# Patient Record
Sex: Female | Born: 1980 | Race: Black or African American | Hispanic: No | Smoking: Never smoker
Health system: Southern US, Community
[De-identification: ages and names within clinical notes are randomized; demographics above are authoritative.]

## PROBLEM LIST (undated history)

## (undated) DIAGNOSIS — D649 Anemia, unspecified: Secondary | ICD-10-CM

## (undated) DIAGNOSIS — D689 Coagulation defect, unspecified: Secondary | ICD-10-CM

## (undated) DIAGNOSIS — F32A Depression, unspecified: Secondary | ICD-10-CM

## (undated) DIAGNOSIS — R87629 Unspecified abnormal cytological findings in specimens from vagina: Secondary | ICD-10-CM

## (undated) DIAGNOSIS — D693 Immune thrombocytopenic purpura: Secondary | ICD-10-CM

## (undated) DIAGNOSIS — D219 Benign neoplasm of connective and other soft tissue, unspecified: Secondary | ICD-10-CM

## (undated) DIAGNOSIS — O09529 Supervision of elderly multigravida, unspecified trimester: Secondary | ICD-10-CM

## (undated) DIAGNOSIS — F329 Major depressive disorder, single episode, unspecified: Secondary | ICD-10-CM

## (undated) DIAGNOSIS — I10 Essential (primary) hypertension: Secondary | ICD-10-CM

## (undated) DIAGNOSIS — I309 Acute pericarditis, unspecified: Secondary | ICD-10-CM

## (undated) DIAGNOSIS — R7309 Other abnormal glucose: Secondary | ICD-10-CM

## (undated) DIAGNOSIS — F419 Anxiety disorder, unspecified: Secondary | ICD-10-CM

## (undated) DIAGNOSIS — E669 Obesity, unspecified: Secondary | ICD-10-CM

## (undated) HISTORY — DX: Coagulation defect, unspecified: D68.9

## (undated) HISTORY — DX: Anxiety disorder, unspecified: F41.9

## (undated) HISTORY — DX: Supervision of elderly multigravida, unspecified trimester: O09.529

## (undated) HISTORY — DX: Essential (primary) hypertension: I10

## (undated) HISTORY — DX: Major depressive disorder, single episode, unspecified: F32.9

## (undated) HISTORY — DX: Acute pericarditis, unspecified: I30.9

## (undated) HISTORY — DX: Benign neoplasm of connective and other soft tissue, unspecified: D21.9

## (undated) HISTORY — PX: UMBILICAL HERNIA REPAIR: SHX196

## (undated) HISTORY — DX: Unspecified abnormal cytological findings in specimens from vagina: R87.629

## (undated) HISTORY — DX: Obesity, unspecified: E66.9

## (undated) HISTORY — DX: Anemia, unspecified: D64.9

## (undated) HISTORY — DX: Depression, unspecified: F32.A

## (undated) HISTORY — DX: Other abnormal glucose: R73.09

---

## 2011-01-17 ENCOUNTER — Emergency Department (HOSPITAL_COMMUNITY)
Admission: EM | Admit: 2011-01-17 | Discharge: 2011-01-17 | Payer: Self-pay | Source: Home / Self Care | Admitting: Family Medicine

## 2011-01-22 LAB — CBC
HCT: 32.4 % — ABNORMAL LOW (ref 36.0–46.0)
MCV: 74 fL — ABNORMAL LOW (ref 78.0–100.0)
Platelets: 95 10*3/uL — ABNORMAL LOW (ref 150–400)
RBC: 4.38 MIL/uL (ref 3.87–5.11)
WBC: 7.3 10*3/uL (ref 4.0–10.5)

## 2011-01-22 LAB — POCT PREGNANCY, URINE: Preg Test, Ur: NEGATIVE

## 2011-01-22 LAB — URINE CULTURE
Colony Count: 8000
Culture  Setup Time: 201201181208

## 2011-01-22 LAB — POCT URINALYSIS DIPSTICK
Ketones, ur: NEGATIVE mg/dL
Specific Gravity, Urine: 1.015 (ref 1.005–1.030)
Urine Glucose, Fasting: NEGATIVE mg/dL

## 2011-01-22 LAB — DIFFERENTIAL
Basophils Absolute: 0 10*3/uL (ref 0.0–0.1)
Eosinophils Absolute: 0.1 10*3/uL (ref 0.0–0.7)
Lymphocytes Relative: 28 % (ref 12–46)
Lymphs Abs: 2.1 10*3/uL (ref 0.7–4.0)
Neutrophils Relative %: 62 % (ref 43–77)

## 2012-02-20 ENCOUNTER — Telehealth: Payer: Self-pay | Admitting: *Deleted

## 2012-02-20 ENCOUNTER — Encounter (HOSPITAL_COMMUNITY): Payer: Self-pay | Admitting: Emergency Medicine

## 2012-02-20 ENCOUNTER — Encounter (HOSPITAL_COMMUNITY): Payer: Self-pay | Admitting: *Deleted

## 2012-02-20 ENCOUNTER — Emergency Department (HOSPITAL_COMMUNITY)
Admission: EM | Admit: 2012-02-20 | Discharge: 2012-02-20 | Discharge status: Against Medical Advice | Payer: Managed Care, Other (non HMO) | Attending: Emergency Medicine | Admitting: Emergency Medicine

## 2012-02-20 ENCOUNTER — Emergency Department (HOSPITAL_COMMUNITY)
Admission: EM | Admit: 2012-02-20 | Discharge: 2012-02-20 | Disposition: A | Discharge status: Nursing Home (Includes ICF) | Payer: Self-pay | Source: Home / Self Care | Attending: Emergency Medicine | Admitting: Emergency Medicine

## 2012-02-20 ENCOUNTER — Other Ambulatory Visit: Payer: Self-pay | Admitting: Oncology

## 2012-02-20 ENCOUNTER — Ambulatory Visit (HOSPITAL_COMMUNITY)
Admission: RE | Admit: 2012-02-20 | Discharge: 2012-02-20 | Disposition: A | Discharge status: Home / Self Care | Payer: Managed Care, Other (non HMO) | Source: Ambulatory Visit | Attending: Oncology | Admitting: Oncology

## 2012-02-20 DIAGNOSIS — D509 Iron deficiency anemia, unspecified: Secondary | ICD-10-CM | POA: Insufficient documentation

## 2012-02-20 DIAGNOSIS — D696 Thrombocytopenia, unspecified: Secondary | ICD-10-CM

## 2012-02-20 DIAGNOSIS — I998 Other disorder of circulatory system: Secondary | ICD-10-CM | POA: Insufficient documentation

## 2012-02-20 DIAGNOSIS — R51 Headache: Secondary | ICD-10-CM | POA: Insufficient documentation

## 2012-02-20 DIAGNOSIS — D693 Immune thrombocytopenic purpura: Secondary | ICD-10-CM | POA: Insufficient documentation

## 2012-02-20 HISTORY — DX: Immune thrombocytopenic purpura: D69.3

## 2012-02-20 LAB — CBC
MCH: 21.8 pg — ABNORMAL LOW (ref 26.0–34.0)
MCHC: 31.7 g/dL (ref 30.0–36.0)
MCV: 69 fL — ABNORMAL LOW (ref 78.0–100.0)
Platelets: 14 10*3/uL — CL (ref 150–400)
RDW: 17.5 % — ABNORMAL HIGH (ref 11.5–15.5)
WBC: 5.5 10*3/uL (ref 4.0–10.5)

## 2012-02-20 LAB — POCT I-STAT, CHEM 8
Calcium, Ion: 1.22 mmol/L (ref 1.12–1.32)
Creatinine, Ser: 0.8 mg/dL (ref 0.50–1.10)
Glucose, Bld: 82 mg/dL (ref 70–99)
HCT: 35 % — ABNORMAL LOW (ref 36.0–46.0)
Hemoglobin: 11.9 g/dL — ABNORMAL LOW (ref 12.0–15.0)
Potassium: 3.8 mEq/L (ref 3.5–5.1)
TCO2: 27 mmol/L (ref 0–100)

## 2012-02-20 MED ORDER — PREDNISONE 50 MG PO TABS: 100.0000 mg | ORAL_TABLET | Freq: Every day | ORAL | Status: DC

## 2012-02-20 NOTE — ED Notes (Signed)
Pt here from [facility] c/o increased episodes of ecchymosis to hands and extremities; pt with hx of ITP; pt sent here for further eval

## 2012-02-20 NOTE — ED Provider Notes (Signed)
History     CSN: [id number]  Arrival date & time 02/20/12  1106   First MD Initiated Contact with Patient 02/20/12 1217      Chief Complaint  Patient presents with  . Bleeding/Bruising    (Consider location/radiation/quality/duration/timing/severity/associated sxs/prior treatment) HPI Comments:  Patient presented today to urgent care complaining of an ongoing headache for about a week somewhat intermittent in nature and localized mainly to the right side of her head. Have been also noticing some bruising inside of her mouth,  almost felt like a blister and dark looking". Patient haven't noticed also some spontaneous bruising on both of her hands and her legs within the last week including today (patient shows me a ecchymotic area interdigitally in her left hand).  Patient goes on to describe That She Was Diagnosed with ITP in [place] about a year ago. At one point she was recommended to receive a blood transfusion (perhaps platelet transfusioN)  She decided to come in today because she was feeling that this bruises are common on in different areas of her body and was having unusual type of headache. Felt that her platelets again to be low she describes lowest values she remember having was around 65.   Patient is a 31 y.o. female presenting with headaches and rash. The history is provided by the patient.  Headache The primary symptoms include headaches. Primary symptoms do not include loss of consciousness, dizziness, visual change, loss of sensation, speech change, fever or nausea. The symptoms began more than 1 week ago. The symptoms are unchanged.  The headache is not associated with visual change or weakness.  Additional symptoms include pain. Additional symptoms do not include weakness. Medical issues do not include seizures.  Rash  This is a new problem. The problem has not changed since onset.The problem is associated with nothing. There has been no fever. The rash is present on  the right arm and left arm. Pertinent negatives include no itching and no weeping.    Past Medical History  Diagnosis Date  . ITP (idiopathic thrombocytopenic purpura)     History reviewed. No pertinent past surgical history.  Family History  Problem Relation Age of Onset  . Hypertension Mother   . Diabetes Father     History  Substance Use Topics  . Smoking status: Not on file  . Smokeless tobacco: Not on file  . Alcohol Use: Yes     occasionally    OB History    Grav Para Term Preterm Abortions TAB SAB Ect Mult Living                  Review of Systems  Constitutional: Negative for fever, activity change and appetite change.  Gastrointestinal: Negative for nausea.  Genitourinary: Negative for dysuria, hematuria and flank pain.  Skin: Positive for rash. Negative for itching.  Neurological: Positive for headaches. Negative for dizziness, speech change, loss of consciousness, syncope, weakness and numbness.  Hematological: Negative for adenopathy. Bruises/bleeds easily.    Allergies  Review of patient's allergies indicates no known allergies.  Home Medications  No current outpatient prescriptions on file.  BP 116/68  Pulse 88  Temp(Src) 97.8 F (36.6 C) (Oral)  Resp 16  SpO2 100%  LMP 02/04/2012  Physical Exam  Nursing note and vitals reviewed. Constitutional: She appears well-developed and well-nourished. No distress.  HENT:  Mouth/Throat:    Eyes: Conjunctivae are normal. No scleral icterus.  Neck: Neck supple. No JVD present.  Abdominal: There is no splenomegaly.  There is no tenderness. There is no rigidity and no guarding.  Lymphadenopathy:    She has no cervical adenopathy.  Skin: Ecchymosis, petechiae and rash noted.       ED Course  Procedures (including critical care time)  Labs Reviewed  CBC - Abnormal; Notable for the following:    Hemoglobin 10.2 (*)    HCT 32.2 (*)    MCV 69.0 (*)    MCH 21.8 (*)    RDW 17.5 (*)    Platelets  14 (*)    All other components within normal limits  POCT I-STAT, CHEM 8 - Abnormal; Notable for the following:    Hemoglobin 11.9 (*)    HCT 35.0 (*)    All other components within normal limits  APTT   Ct Head Wo Contrast  02/20/2012  *RADIOLOGY REPORT*  Clinical Data:  Intermittent headaches for the past week.  CT HEAD WITHOUT CONTRAST  Technique: Contiguous axial images were obtained from the base of the skull through the vertex without contrast.  Comparison: None.  Findings: Normal appearing cerebral hemispheres and posterior fossa structures.  Normal size and position of the ventricles.  No intracranial hemorrhage, mass lesion or evidence of acute infarction.  Unremarkable bones and included portions of the paranasal sinuses.  IMPRESSION: Normal examination.  Original Report Authenticated By: Darrol Angel, M.D.     1. Thrombocytopenia       MDM   Patient presented to urgent care symptomatic with intermittent skin changes including perforated ecchymosis and petechial compliant rashes 2 oral mucosa. Also with a consistent headache. Patient with severe thrombocytopenia and also anemia       Jimmie Molly, MD 02/20/12 1757

## 2012-02-20 NOTE — ED Notes (Signed)
Pt  Reports  She  Noticed  Some  Bruising  And  Discoloration on  Skin     For  A  Few days  As  Well  As  A  Mild headache  And  r  Side  Pain  Pt  Ambulates  Upright  With a  Steady  Fluid  Gait     Appears  In no  Severe  Distress

## 2012-02-20 NOTE — ED Notes (Signed)
Pt signed out AMA.

## 2012-02-20 NOTE — Telephone Encounter (Signed)
Received a call from this patient on Monday 02/18/2012 attempting to establish with local Hematologist for history of ITP. Patient states she moved here from [place] and has not established with any physicians in the area yet. I asked patient to get in contact with her doctor she had in [place] and have them fax records and we could make her appt.  Spoke with patient again on morning of 02/20/12, she states she talked to her doctor in [place] and they said they had not received any release form and they could mail her records to her in the next two weeks. Concerned of this patient with bleeding disorder, she states she has noticed increased bruising as well as "blood spots" in her mouth. Informed patient she needs to see someone today and have her platelets checked. Patient called back at 4pm today and said her platelets were 14 at [facility], she was being sent to [place] [facility], but when she got there she could not afford the co-pay, so patient decided to leave and told the ER she would contact our office since she was not actively bleeding. Unfortunately, I was unable to catch the patient before she left the ER today, I had already spoken to our MD on call today, Dr Pierce Crane, he said he could go see her in ER once she was settled in, to have the ER doctor call him.  Patient called me back, I instructed her to come to our office to have release form signed so I can get records from [facility] in [place]. Dr Donnie Coffin did informal consult in conference room, gave orders for head ct for tonight, Rx for Prednisone to pharmacy and see for formal consult/physical assessment tomorrow at 12noon.  Patient also instructed that if she does start to have ANY bleeding, she is to report to ER.  Patient aware of appts and verbalized understanding.

## 2012-02-20 NOTE — ED Notes (Signed)
Pt  States  She  Has ITP   SHE  REPORTS  SOME  BRUISING     SHE  STATES  SHE HAS NOT SEEN A  DR  IN  OVER  1  YEAR       SHE  REPORTS  HER  LAST  DR  WAS  IN [place]

## 2012-02-21 ENCOUNTER — Other Ambulatory Visit: Payer: Managed Care, Other (non HMO) | Admitting: Lab

## 2012-02-21 ENCOUNTER — Ambulatory Visit: Payer: Managed Care, Other (non HMO)

## 2012-02-21 ENCOUNTER — Ambulatory Visit (HOSPITAL_BASED_OUTPATIENT_CLINIC_OR_DEPARTMENT_OTHER): Payer: Managed Care, Other (non HMO) | Admitting: Oncology

## 2012-02-21 ENCOUNTER — Telehealth: Payer: Self-pay | Admitting: *Deleted

## 2012-02-21 ENCOUNTER — Encounter (HOSPITAL_COMMUNITY): Payer: Self-pay | Admitting: *Deleted

## 2012-02-21 VITALS — BP 119/81 | HR 66 | Temp 98.2°F | Ht 69.0 in | Wt 236.4 lb

## 2012-02-21 DIAGNOSIS — D693 Immune thrombocytopenic purpura: Secondary | ICD-10-CM

## 2012-02-21 LAB — IRON AND TIBC
%SAT: 3 % — ABNORMAL LOW (ref 20–55)
Iron: 17 ug/dL — ABNORMAL LOW (ref 42–145)
UIBC: 510 ug/dL — ABNORMAL HIGH (ref 125–400)

## 2012-02-21 LAB — CBC & DIFF AND RETIC
Basophils Absolute: 0 10*3/uL (ref 0.0–0.1)
EOS%: 3.2 % (ref 0.0–7.0)
Eosinophils Absolute: 0.2 10*3/uL (ref 0.0–0.5)
LYMPH%: 10.8 % — ABNORMAL LOW (ref 14.0–49.7)
MCH: 21.9 pg — ABNORMAL LOW (ref 25.1–34.0)
MCV: 68.3 fL — ABNORMAL LOW (ref 79.5–101.0)
MONO%: 5 % (ref 0.0–14.0)
Platelets: 18 10*3/uL — ABNORMAL LOW (ref 145–400)
RBC: 4.61 10*6/uL (ref 3.70–5.45)
RDW: 17.5 % — ABNORMAL HIGH (ref 11.2–14.5)
Retic %: 1.53 % (ref 0.70–2.10)
Retic Ct Abs: 70.53 10*3/uL (ref 33.70–90.70)

## 2012-02-21 LAB — COMPREHENSIVE METABOLIC PANEL
Alkaline Phosphatase: 56 U/L (ref 39–117)
BUN: 10 mg/dL (ref 6–23)
CO2: 25 mEq/L (ref 19–32)
Creatinine, Ser: 0.66 mg/dL (ref 0.50–1.10)
Glucose, Bld: 89 mg/dL (ref 70–99)
Total Bilirubin: 0.8 mg/dL (ref 0.3–1.2)

## 2012-02-21 LAB — FERRITIN: Ferritin: 5 ng/mL — ABNORMAL LOW (ref 10–291)

## 2012-02-21 LAB — LACTATE DEHYDROGENASE: LDH: 233 U/L (ref 94–250)

## 2012-02-21 MED ORDER — PREDNISONE 20 MG PO TABS: 80.0000 mg | ORAL_TABLET | Freq: Every day | ORAL | Status: DC

## 2012-02-21 NOTE — Telephone Encounter (Signed)
gave patient appointment for 02-29-2012 starting at 8:45am printed out calendar and gave to the patient

## 2012-02-21 NOTE — Progress Notes (Signed)
Referral MD Dr Liliana Cline Reason for Referral: ITP  Chief Complaint  Patient presents with  . New Evaluation  : This is a 31 year old woman from [place] but was recently moved from [place] who presents with a document history of ITP principal diagnosed in 2007. As that she has had a located result back to [place] where she is currently working. She has noted some heavy menstrual bleeding recently. She got to the ER and prior to that had some lab work done which showed a platelet count of 14. We saw briefly yesterday evaluated her in our office. We gave her a prescription for prednisone. She was complaining of some headaches and so we obtain a noncontrast CT of the head which was negative for bleeding. She is currently 100 mg of prednisone a day.   HPI:  Past Medical History  Diagnosis Date  . ITP (idiopathic thrombocytopenic purpura)   : This patient was diagnosed by a hematologist in [place] in 2007. 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 She is also noted to have absent iron stores. She is been treated with oral steroids and responded. She claims her blood counts have been up and down but she'll received one course of high-dose prednisone in the past of 6 years he has been noted to be iron deficient and has received IV iron multiple times over that period of time. As noted she has recently moved back to [place] where she has been here for about 2 years but has not sought medical medical attention. She continues to have heavy menses. She is not taking any medications and does not taking oral iron pills. He has not seen a gynecologist. She has some feelings of fatigue occasional headaches occasional dyspnea on exertion.   No past surgical history on file.:  Current outpatient prescriptions:predniSONE (DELTASONE) 50 MG tablet, Take  100 mg by mouth daily., Disp: , Rfl:  Current facility-administered medications:DISCONTD: predniSONE (DELTASONE) tablet 80 mg, 80 mg, Oral, Q breakfast, Pierce Crane, MD Facility-Administered Medications Ordered in Other Visits: DISCONTD: predniSONE (DELTASONE) tablet 100 mg, 100 mg, Oral, Q breakfast, Pierce Crane, MD:     . DISCONTD: predniSONE  80 mg Oral Q breakfast  :  No Known Allergies:  Family History  Problem Relation Age of Onset  . Hypertension Mother   . Diabetes Father   :  History   Social History  . Marital Status: Single , has a SO, with 2 children    Spouse Name: N/A    Number of Children: 2  . Years of Education: [employer]   Occupational History  . Not on file.works for [employer]   Social History Main Topics  . Smoking status: No   . Smokeless tobacco: Not on file  . Alcohol Use: Yes     occasionally  . Drug Use:   . Sexually Active:    Other Topics Concern  . Not on file   Social History Narrative  . No narrative on file, previously screened for HIV and hepatitis both negative   : Family Hx History the family bleeding disorders. She is one brother in good health, father has history of diabetes.  A comprehensive review of systems was negative.  Exam: @IPVITALS @ General appearance: alert, cooperative and appears stated age Head: Normocephalic, without obvious abnormality, atraumatic Eyes: conjunctivae/corneas clear. PERRL, EOM's intact. Fundi benign. Throat: lips, mucosa, and tongue  normal; teeth and gums normal Resp: clear to auscultation bilaterally and normal percussion bilaterally Cardio: regular rate and rhythm, S1, S2 normal, no murmur, click, rub or gallop and normal apical impulse GI: soft, non-tender; bowel sounds normal; no masses,  no organomegaly Extremities: extremities normal, atraumatic, no cyanosis or edema Pulses: 2+ and symmetric Lymph nodes: Cervical, supraclavicular, and axillary nodes normal. Neurologic: Grossly  normal   Basename 02/20/12 1336 02/20/12 1322  WBC -- 5.5  HGB 11.9* 10.2*  HCT 35.0* 32.2*  PLT -- 14*    Basename 02/20/12 1336  NA 139  K 3.8  CL 103  CO2 --  GLUCOSE 82  BUN 10  CREATININE 0.80  CALCIUM --    Blood smear review:Few Large & rare giant platelets, mod ovalocytes, few helmets and targets   Pathology:n/a  Ct Head Wo Contrast  02/20/2012  *RADIOLOGY REPORT*  Clinical Data:  Intermittent headaches for the past week.  CT HEAD WITHOUT CONTRAST  Technique: Contiguous axial images were obtained from the base of the skull through the vertex without contrast.  Comparison: None.  Findings: Normal appearing cerebral hemispheres and posterior fossa structures.  Normal size and position of the ventricles.  No intracranial hemorrhage, mass lesion or evidence of acute infarction.  Unremarkable bones and included portions of the paranasal sinuses.  IMPRESSION: Normal examination.  Original Report Authenticated By: Darrol Angel, M.D.    Assessment and Plan: This is a pleasant 31 year old woman with a document history of ITP. We spent 30 minutes discussing this disease with which you know something about. She has not had problems with refractory disease in his libido prednisone as far she knows once. She is known to be iron deficient and we have scheduled her to receive IV iron. She is fatigued and has symptoms of iron deficiency including microcytic picture evidence of ovalocytes, target cells and some few helmets although her smear. In addition we talked about putting on a high-dose prednisone course for 2 weeks and then taper over 6 weeks. We'll plan to check CBCs weekly and rhythm her IV iron with her next visit next week. I discussed the option of possible splenectomy she cannot maintain an adequate platelet count off prednisone. She would like to avoid this if possible. Other options for treatment include rituxan therapy.  I will plan to see her next week  Donnie Coffin M.D., FRCP  C.

## 2012-02-26 ENCOUNTER — Telehealth: Payer: Self-pay | Admitting: *Deleted

## 2012-02-26 NOTE — Telephone Encounter (Signed)
made patient appointment with central [place] gyn on 03-13-2012 at 9:15am

## 2012-02-29 ENCOUNTER — Telehealth: Payer: Self-pay | Admitting: Oncology

## 2012-02-29 ENCOUNTER — Other Ambulatory Visit (HOSPITAL_BASED_OUTPATIENT_CLINIC_OR_DEPARTMENT_OTHER): Payer: Managed Care, Other (non HMO) | Admitting: Lab

## 2012-02-29 ENCOUNTER — Ambulatory Visit (HOSPITAL_BASED_OUTPATIENT_CLINIC_OR_DEPARTMENT_OTHER): Payer: Managed Care, Other (non HMO)

## 2012-02-29 ENCOUNTER — Ambulatory Visit (HOSPITAL_BASED_OUTPATIENT_CLINIC_OR_DEPARTMENT_OTHER): Payer: Managed Care, Other (non HMO) | Admitting: Physician Assistant

## 2012-02-29 ENCOUNTER — Encounter: Payer: Self-pay | Admitting: Physician Assistant

## 2012-02-29 VITALS — BP 130/79 | HR 80 | Temp 99.1°F | Ht 69.0 in | Wt 239.0 lb

## 2012-02-29 DIAGNOSIS — D509 Iron deficiency anemia, unspecified: Secondary | ICD-10-CM

## 2012-02-29 DIAGNOSIS — D693 Immune thrombocytopenic purpura: Secondary | ICD-10-CM

## 2012-02-29 DIAGNOSIS — N920 Excessive and frequent menstruation with regular cycle: Secondary | ICD-10-CM

## 2012-02-29 LAB — CBC WITH DIFFERENTIAL/PLATELET
Basophils Absolute: 0 10*3/uL (ref 0.0–0.1)
EOS%: 0.7 % (ref 0.0–7.0)
Eosinophils Absolute: 0.1 10*3/uL (ref 0.0–0.5)
HGB: 9.8 g/dL — ABNORMAL LOW (ref 11.6–15.9)
LYMPH%: 31.5 % (ref 14.0–49.7)
MCH: 22 pg — ABNORMAL LOW (ref 25.1–34.0)
MCV: 68.2 fL — ABNORMAL LOW (ref 79.5–101.0)
MONO%: 7.4 % (ref 0.0–14.0)
NEUT#: 9.4 10*3/uL — ABNORMAL HIGH (ref 1.5–6.5)
NEUT%: 60.3 % (ref 38.4–76.8)
Platelets: 471 10*3/uL — ABNORMAL HIGH (ref 145–400)
RDW: 17.7 % — ABNORMAL HIGH (ref 11.2–14.5)

## 2012-02-29 LAB — MORPHOLOGY: PLT EST: INCREASED

## 2012-02-29 MED ORDER — SODIUM CHLORIDE 0.9 % IV SOLN
Freq: Once | INTRAVENOUS | Status: AC
  Administered 2012-02-29: 10:00:00 via INTRAVENOUS

## 2012-02-29 MED ORDER — FERUMOXYTOL INJECTION 510 MG/17 ML
510.0000 mg | Freq: Once | INTRAVENOUS | Status: AC
  Administered 2012-02-29: 510 mg via INTRAVENOUS
  Filled 2012-02-29: qty 17

## 2012-02-29 NOTE — Progress Notes (Signed)
Hematology and Oncology Follow Up Visit  Bailey Sheppard [id number] [birth year] 31 y.o. 02/29/2012    HPI: Bailey Sheppard is a 31 year old [place] 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 She is currently on prednisone 100 mg per day for platelet count of 18,000 noted on 02/21/2012.  Interim History:   Patient is seen today with her significant other Donnell in accompaniment for followup of ITP. She continues on prednisone 100 mg per day and is tolerating quite well. She denies any fevers, chills, night sweats, no shortness of breath, or chest pain. She denies any heartburn symptoms, no hematochezia or melena. Of note she will be receiving IV iron today for a history of iron deficiency anemia secondary to menorrhagia. She denies any bleeding or bruising symptoms. No headaches or vision changes. A detailed review of systems is otherwise noncontributory as noted below.  Review of Systems: Constitutional:  no weight loss, fever, night sweats and feels well Eyes: no complaints ENT: no complaints Cardiovascular: no chest pain or dyspnea on exertion Respiratory: no cough, shortness of breath, or wheezing Neurological: no TIA or stroke symptoms Dermatological: negative Gastrointestinal: no abdominal pain, change in bowel habits, or black or bloody stools Genito-Urinary: no dysuria, trouble voiding, or hematuria Hematological and Lymphatic: negative Breast: negative Musculoskeletal: negative Remaining ROS negative.   Medications:   I have reviewed the patient's current medications.  Current Outpatient Prescriptions  Medication Sig Dispense Refill  . predniSONE (DELTASONE) 50 MG tablet Take 100 mg by mouth daily.       No current facility-administered medications for this visit.   Facility-Administered Medications Ordered in Other Visits  Medication Dose Route Frequency Provider Last Rate Last Dose  . 0.9 %  sodium chloride infusion    Intravenous Once Pierce Crane, MD      . ferumoxytol [facility]) injection 510 mg  510 mg Intravenous Once Pierce Crane, MD        Allergies: No Known Allergies  Physical Exam: Filed Vitals:   02/29/12 0912  BP: 130/79  Pulse: 80  Temp: 99.1 F (37.3 C)    Body mass index is 35.29 kg/(m^2). Weight: 239 lbs. HEENT:  Sclerae anicteric, conjunctivae pink.  Oropharynx clear.  No mucositis or candidiasis.   Nodes:  No cervical, supraclavicular, or axillary lymphadenopathy palpated.  Lungs:  Clear to auscultation bilaterally.  No crackles, rhonchi, or wheezes.   Heart:  Regular rate and rhythm.   Abdomen:  Soft, nontender.  Positive bowel sounds.  No organomegaly or masses palpated.   Musculoskeletal:  No focal spinal tenderness to palpation.  Extremities:  Benign.  No peripheral edema or cyanosis.   Skin:  Benign.   Neuro:  Nonfocal, alert and oriented x 3.   Lab Results: Lab Results  Component Value Date   WBC 15.6* 02/29/2012   HGB 9.8* 02/29/2012   HCT 30.4* 02/29/2012   MCV 68.2* 02/29/2012   PLT 471* 02/29/2012   NEUTROABS 9.4* 02/29/2012     Chemistry      Component Value Date/Time   NA 139 02/21/2012 1208   K 3.9 02/21/2012 1208   CL 104 02/21/2012 1208   CO2 25 02/21/2012 1208   BUN 10 02/21/2012 1208   CREATININE 0.66 02/21/2012 1208      Component Value Date/Time   CALCIUM 9.6 02/21/2012 1208   ALKPHOS 56 02/21/2012 1208   AST 16 02/21/2012 1208   ALT 14 02/21/2012 1208   BILITOT 0.8 02/21/2012 1208  No results found for this basename: LABCA2    Radiological Studies: Ct Head Wo Contrast  02/20/2012  *RADIOLOGY REPORT*  Clinical Data:  Intermittent headaches for the past week.  CT HEAD WITHOUT CONTRAST  Technique: Contiguous axial images were obtained from the base of the skull through the vertex without contrast.  Comparison: None.  Findings: Normal appearing cerebral hemispheres and posterior fossa structures.  Normal size and position of the ventricles.  No intracranial  hemorrhage, mass lesion or evidence of acute infarction.  Unremarkable bones and included portions of the paranasal sinuses.  IMPRESSION: Normal examination.  Original Report Authenticated By: Darrol Angel, M.D.     Assessment:  Bailey Sheppard is a 31 year old [place] 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 She is currently on prednisone 100 mg per day for platelet count of 18,000 noted on 02/21/2012, now with normalization of platelet count at 471,000. 2. Iron deficiency anemia secondary to menorrhagia for IV iron today.  Case reviewed with Dr. Pierce Crane.  Plan:  Hanne will continue 100 mg of prednisone for the next week, we'll plan on rechecking a CBC on 03/07/2012, and if her platelets continue to look quite good we will start a slow prednisone taper decreasing to 10 mg per week. Pertaining to her severe iron deficiency anemia, she will be receiving IV iron today. I have planned a followup exam on 03/21/2012. Again, we'll be checking her labs on a weekly basis.  This plan was reviewed with the patient, who voices understanding and agreement.  She knows to call with any changes or problems.    Cambri Plourde T, PA-C 02/29/2012

## 2012-02-29 NOTE — Telephone Encounter (Signed)
gve the pt her march 2013 appt calendar 

## 2012-03-06 ENCOUNTER — Other Ambulatory Visit: Payer: Self-pay

## 2012-03-06 DIAGNOSIS — D693 Immune thrombocytopenic purpura: Secondary | ICD-10-CM

## 2012-03-06 MED ORDER — PREDNISONE 10 MG PO TABS: 10.0000 mg | ORAL_TABLET | ORAL | Status: DC

## 2012-03-07 ENCOUNTER — Encounter: Payer: Self-pay | Admitting: Physician Assistant

## 2012-03-07 ENCOUNTER — Other Ambulatory Visit (HOSPITAL_BASED_OUTPATIENT_CLINIC_OR_DEPARTMENT_OTHER): Payer: Managed Care, Other (non HMO) | Admitting: Lab

## 2012-03-07 ENCOUNTER — Ambulatory Visit (HOSPITAL_BASED_OUTPATIENT_CLINIC_OR_DEPARTMENT_OTHER): Payer: Managed Care, Other (non HMO) | Admitting: Physician Assistant

## 2012-03-07 ENCOUNTER — Telehealth: Payer: Self-pay | Admitting: *Deleted

## 2012-03-07 VITALS — BP 135/89 | HR 72 | Temp 98.3°F | Ht 69.0 in | Wt 238.9 lb

## 2012-03-07 DIAGNOSIS — R21 Rash and other nonspecific skin eruption: Secondary | ICD-10-CM

## 2012-03-07 DIAGNOSIS — D693 Immune thrombocytopenic purpura: Secondary | ICD-10-CM

## 2012-03-07 LAB — CBC WITH DIFFERENTIAL/PLATELET
Basophils Absolute: 0.1 10*3/uL (ref 0.0–0.1)
Eosinophils Absolute: 0.1 10*3/uL (ref 0.0–0.5)
HCT: 32.1 % — ABNORMAL LOW (ref 34.8–46.6)
HGB: 10.3 g/dL — ABNORMAL LOW (ref 11.6–15.9)
LYMPH%: 9.9 % — ABNORMAL LOW (ref 14.0–49.7)
MCHC: 32.2 g/dL (ref 31.5–36.0)
MONO#: 1.5 10*3/uL — ABNORMAL HIGH (ref 0.1–0.9)
NEUT#: 15.6 10*3/uL — ABNORMAL HIGH (ref 1.5–6.5)
NEUT%: 81.4 % — ABNORMAL HIGH (ref 38.4–76.8)
Platelets: 341 10*3/uL (ref 145–400)
WBC: 19.2 10*3/uL — ABNORMAL HIGH (ref 3.9–10.3)
lymph#: 1.9 10*3/uL (ref 0.9–3.3)

## 2012-03-07 NOTE — Progress Notes (Signed)
Hematology and Oncology Follow Up Visit  Bailey Sheppard [id number] [birth year] 31 y.o. 03/07/2012    HPI: Bailey Sheppard is a 31 year old [place] 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 She is currently on prednisone 100 mg per day for platelet count of 18,000 noted on 02/21/2012.  Interim History:   Bailey Sheppard is seen between schedule clinic visits today after she presented for her scheduled CBC to assess platelet count. She notes that 2 days after her iron infusion, specifically Feraheme, she developed a burning rash on her legs and buttocks first, this has abated, but now she has noted it on her facial region. She is not change any products as in soaps or lotions recently. In fact, she is reluctant to utilize anything topical since a "burns". She does admit that the area on her legs now has abated, but she still has a sensitivity. She denies any shortness of breath or chest pain. She is recovering from a "cold".  Medications:   I have reviewed the patient's current medications.  Current Outpatient Prescriptions  Medication Sig Dispense Refill  . predniSONE (DELTASONE) 10 MG tablet Take 1 tablet (10 mg total) by mouth as directed. Take as directed.  100 tablet  0    Allergies: No Known Allergies  Physical Exam: Filed Vitals:   03/07/12 1049  BP: 135/89  Pulse: 72  Temp: 98.3 F (36.8 C)    Body mass index is 35.28 kg/(m^2). Weight: 238 lbs. Patient's skin was examined, there is no evidence of rash on her lower extremity, but it is noted she has has a butterfly distribution over her nasal region a fairly confluent red rash without skin breakdown or pustules.  Lab Results: Lab Results  Component Value Date   WBC 19.2* 03/07/2012   HGB 10.3* 03/07/2012   HCT 32.1* 03/07/2012   MCV 73.4* 03/07/2012   PLT 341 03/07/2012   NEUTROABS 15.6* 03/07/2012     Chemistry      Component Value Date/Time   NA 139 02/21/2012 1208   K 3.9 02/21/2012  1208   CL 104 02/21/2012 1208   CO2 25 02/21/2012 1208   BUN 10 02/21/2012 1208   CREATININE 0.66 02/21/2012 1208      Component Value Date/Time   CALCIUM 9.6 02/21/2012 1208   ALKPHOS 56 02/21/2012 1208   AST 16 02/21/2012 1208   ALT 14 02/21/2012 1208   BILITOT 0.8 02/21/2012 1208      No results found for this basename: LABCA2      Assessment:  Bailey Sheppard is a 31 year old [place] 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 She is currently on prednisone 100 mg per day, With normalization of her platelet count. 2. Iron deficiency anemia secondary to menorrhagia. Day 7 Feraheme infusion. 3. Nonspecific "burning" rash felt to be secondary to Feraheme.  Case reviewed with Dr. Pierce Crane.  Plan:  Bailey Sheppard will decrease her prednisone to 90 mg per day for the next week with a repeat CBC in one week's time. Pertaining to the rash, we have recommended that she take Benadryl or Claritin on a routine basis. She knows to contact us prior to her one week for repeat lab if the need should arise. This plan was reviewed with the patient, who voices understanding and agreement.  She knows to call with any changes or problems.    Bailey Sheppard T, PA-C 03/07/2012

## 2012-03-07 NOTE — Telephone Encounter (Signed)
per nurse myers orders placed patient on dr.christine schere calendar on 03-07-2012 for 10:15am

## 2012-03-13 ENCOUNTER — Encounter (INDEPENDENT_AMBULATORY_CARE_PROVIDER_SITE_OTHER): Payer: Managed Care, Other (non HMO) | Admitting: Obstetrics and Gynecology

## 2012-03-14 ENCOUNTER — Other Ambulatory Visit (HOSPITAL_BASED_OUTPATIENT_CLINIC_OR_DEPARTMENT_OTHER): Payer: Managed Care, Other (non HMO) | Admitting: Lab

## 2012-03-14 ENCOUNTER — Telehealth: Payer: Self-pay | Admitting: *Deleted

## 2012-03-14 DIAGNOSIS — D693 Immune thrombocytopenic purpura: Secondary | ICD-10-CM

## 2012-03-14 LAB — CBC WITH DIFFERENTIAL/PLATELET
BASO%: 0.3 % (ref 0.0–2.0)
Basophils Absolute: 0 10*3/uL (ref 0.0–0.1)
EOS%: 0.9 % (ref 0.0–7.0)
HCT: 33.8 % — ABNORMAL LOW (ref 34.8–46.6)
HGB: 10.9 g/dL — ABNORMAL LOW (ref 11.6–15.9)
LYMPH%: 27.2 % (ref 14.0–49.7)
MCH: 24.3 pg — ABNORMAL LOW (ref 25.1–34.0)
MCHC: 32.3 g/dL (ref 31.5–36.0)
MCV: 75.2 fL — ABNORMAL LOW (ref 79.5–101.0)
NEUT%: 65.2 % (ref 38.4–76.8)
Platelets: 176 10*3/uL (ref 145–400)
lymph#: 3.4 10*3/uL — ABNORMAL HIGH (ref 0.9–3.3)

## 2012-03-14 NOTE — Telephone Encounter (Signed)
Pt. Called.  She had a cbc today and she wonders if she needs to continue to taper her prednisone.   Discussed with Debbora Presto PA.  Yes, she can continue to taper.  She should take 80mg  daily  And she has lab and sees [place] on 3/22.  Reviewed labs and plan with patient.  She verbalized understanding and is aware of her appt. On 3/22

## 2012-03-21 ENCOUNTER — Ambulatory Visit (HOSPITAL_BASED_OUTPATIENT_CLINIC_OR_DEPARTMENT_OTHER): Payer: Managed Care, Other (non HMO) | Admitting: Physician Assistant

## 2012-03-21 ENCOUNTER — Telehealth: Payer: Self-pay | Admitting: Oncology

## 2012-03-21 ENCOUNTER — Other Ambulatory Visit (HOSPITAL_BASED_OUTPATIENT_CLINIC_OR_DEPARTMENT_OTHER): Payer: Managed Care, Other (non HMO) | Admitting: Lab

## 2012-03-21 VITALS — BP 126/86 | HR 88 | Temp 98.0°F | Ht 69.0 in | Wt 246.8 lb

## 2012-03-21 DIAGNOSIS — D693 Immune thrombocytopenic purpura: Secondary | ICD-10-CM

## 2012-03-21 DIAGNOSIS — D509 Iron deficiency anemia, unspecified: Secondary | ICD-10-CM

## 2012-03-21 LAB — CBC WITH DIFFERENTIAL/PLATELET
BASO%: 0.4 % (ref 0.0–2.0)
Basophils Absolute: 0 10*3/uL (ref 0.0–0.1)
EOS%: 1.1 % (ref 0.0–7.0)
HGB: 12 g/dL (ref 11.6–15.9)
MCH: 25.3 pg (ref 25.1–34.0)
MCHC: 32.7 g/dL (ref 31.5–36.0)
MCV: 77.4 fL — ABNORMAL LOW (ref 79.5–101.0)
MONO%: 7.5 % (ref 0.0–14.0)
NEUT%: 61.6 % (ref 38.4–76.8)
RDW: 29.5 % — ABNORMAL HIGH (ref 11.2–14.5)
lymph#: 4 10*3/uL — ABNORMAL HIGH (ref 0.9–3.3)

## 2012-03-21 LAB — COMPREHENSIVE METABOLIC PANEL
AST: 11 U/L (ref 0–37)
Alkaline Phosphatase: 48 U/L (ref 39–117)
BUN: 15 mg/dL (ref 6–23)
Glucose, Bld: 100 mg/dL — ABNORMAL HIGH (ref 70–99)
Sodium: 141 mEq/L (ref 135–145)
Total Bilirubin: 0.6 mg/dL (ref 0.3–1.2)

## 2012-03-21 NOTE — Progress Notes (Signed)
Hematology and Oncology Follow Up Visit  Bailey Sheppard [id number] [birth year] 31 y.o. 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 She is currently on prednisone 80 mg per day for platelet count of 18,000 noted on 02/21/201 at time of presentaion, with normalization of platelet count active prednisone taper. 2. Iron deficiency anemia secondary to menorrhagia s/p IV iron on 02/29/12, normalization of Hgb.   Interim History:   Bailey Sheppard is seen today for followup pertaining to her history of ITP, on a very slow prednisone taper, current dose of prednisone at 80 milligrams per day. She is tolerating it well. She denies fevers, chills, or night sweats. Her overall energy level is fairly good, she denies shortness of breath or chest pain. She had a fairly normal menstrual cycle one week ago. Of note she is scheduled to be seen by Dr. Trenda Moots in 2 weeks' time to establish GYN care. She denies any diffuse bone pain, no unexplained bleeding or bruising symptoms.   Review of Systems: Constitutional:  no weight loss, fever, night sweats and feels well Eyes: no complaints ENT: no complaints Cardiovascular: no chest pain or dyspnea on exertion Respiratory: no cough, shortness of breath, or wheezing Neurological: no TIA or stroke symptoms Dermatological: negative Gastrointestinal: no abdominal pain, change in bowel habits, or black or bloody stools Genito-Urinary: no dysuria, trouble voiding, or hematuria Hematological and Lymphatic: negative Breast: negative Musculoskeletal: negative Remaining ROS negative.   Medications:   I have reviewed the patient's current medications.  Current Outpatient Prescriptions  Medication Sig Dispense Refill  . predniSONE (DELTASONE) 10 MG tablet Take 1 tablet (10 mg total) by mouth as directed. Take as directed.  100 tablet  0    Allergies: No Known  Allergies  Physical Exam: Filed Vitals:   03/21/12 1026  BP: 126/86  Pulse: 88  Temp: 98 F (36.7 C)    Body mass index is 36.45 kg/(m^2). Weight: 246 lbs. HEENT:  Sclerae anicteric, conjunctivae pink.  Oropharynx clear.  No mucositis or candidiasis.   Nodes:  No cervical, supraclavicular, or axillary lymphadenopathy palpated.  Lungs:  Clear to auscultation bilaterally.  No crackles, rhonchi, or wheezes.   Heart:  Regular rate and rhythm.   Abdomen:  Soft, nontender.  Positive bowel sounds.  No organomegaly or masses palpated.   Musculoskeletal:  No focal spinal tenderness to palpation.  Extremities:  Benign.  No peripheral edema or cyanosis.   Skin:  Benign.   Neuro:  Nonfocal, alert and oriented x 3.   Lab Results: Lab Results  Component Value Date   WBC 13.6* 03/21/2012   HGB 12.0 03/21/2012   HCT 36.6 03/21/2012   MCV 77.4* 03/21/2012   PLT 156 03/21/2012   NEUTROABS 8.4* 03/21/2012     Chemistry      Component Value Date/Time   NA 139 02/21/2012 1208   K 3.9 02/21/2012 1208   CL 104 02/21/2012 1208   CO2 25 02/21/2012 1208   BUN 10 02/21/2012 1208   CREATININE 0.66 02/21/2012 1208      Component Value Date/Time   CALCIUM 9.6 02/21/2012 1208   ALKPHOS 56 02/21/2012 1208   AST 16 02/21/2012 1208   ALT 14 02/21/2012 1208   BILITOT 0.8 02/21/2012 1208      No results found for this basename: LABCA2     Assessment:  Bailey Sheppard is a 31 year old [place] woman with ITP,  originally diagnosed in 2007 while she was residing in [place]. She is currently on prednisone 80 mg per day for platelet count of 18,000 noted on 02/21/201 at time of presentaion, with normalization of platelet count active prednisone taper. 2. Iron deficiency anemia secondary to menorrhagia s/p IV iron on 02/29/12, normalization of Hgb.  Case reviewed with Dr. Pierce Crane.  Plan:  Rene will decrease her prednisone dose to 70 mg per day for the next week, we'll plan on rechecking a CBC on  03/28/2012, and if her platelets continue to look quite good we will continue her slow prednisone taper, decreasing to 60 mg per day. We will monitor CBC weekly, serum chemistries on an every other week basis. I have planned a followup exam on 04/18/2012.   This plan was reviewed with the patient, who voices understanding and agreement.  She knows to call with any changes or problems.    Laketta Soderberg T, PA-C 03/21/2012

## 2012-03-21 NOTE — Telephone Encounter (Signed)
gve the pt her march,april 2013 appts calendar

## 2012-03-28 ENCOUNTER — Other Ambulatory Visit (HOSPITAL_BASED_OUTPATIENT_CLINIC_OR_DEPARTMENT_OTHER): Payer: Managed Care, Other (non HMO) | Admitting: Lab

## 2012-03-28 DIAGNOSIS — D509 Iron deficiency anemia, unspecified: Secondary | ICD-10-CM

## 2012-03-28 DIAGNOSIS — D693 Immune thrombocytopenic purpura: Secondary | ICD-10-CM

## 2012-03-28 LAB — CBC WITH DIFFERENTIAL/PLATELET
BASO%: 0.2 % (ref 0.0–2.0)
EOS%: 0.5 % (ref 0.0–7.0)
Eosinophils Absolute: 0.1 10*3/uL (ref 0.0–0.5)
MCV: 77.6 fL — ABNORMAL LOW (ref 79.5–101.0)
MONO%: 7.8 % (ref 0.0–14.0)
NEUT#: 8.1 10*3/uL — ABNORMAL HIGH (ref 1.5–6.5)
RBC: 4.89 10*6/uL (ref 3.70–5.45)
RDW: 28.6 % — ABNORMAL HIGH (ref 11.2–14.5)
WBC: 13.4 10*3/uL — ABNORMAL HIGH (ref 3.9–10.3)
nRBC: 0 % (ref 0–0)

## 2012-03-29 ENCOUNTER — Emergency Department (HOSPITAL_COMMUNITY)
Admission: EM | Admit: 2012-03-29 | Discharge: 2012-03-29 | Disposition: A | Discharge status: Home / Self Care | Payer: Managed Care, Other (non HMO) | Source: Home / Self Care

## 2012-03-29 ENCOUNTER — Encounter (HOSPITAL_COMMUNITY): Payer: Self-pay

## 2012-03-29 DIAGNOSIS — IMO0002 Reserved for concepts with insufficient information to code with codable children: Secondary | ICD-10-CM

## 2012-03-29 MED ORDER — HYDROCODONE-ACETAMINOPHEN 5-325 MG PO TABS: 1.0000 | ORAL_TABLET | Freq: Four times a day (QID) | ORAL | Status: AC | PRN

## 2012-03-29 NOTE — Discharge Instructions (Signed)
Knee Pain The knee is the complex joint between your thigh and your lower leg. It is made up of bones, tendons, ligaments, and cartilage. The bones that make up the knee are:  The femur in the thigh.   The tibia and fibula in the lower leg.   The patella or kneecap riding in the groove on the lower femur.  CAUSES  Knee pain is a common complaint with many causes. A few of these causes are:  Injury, such as:   A ruptured ligament or tendon injury.   Torn cartilage.   Medical conditions, such as:   Gout   Arthritis   Infections   Overuse, over training or overdoing a physical activity.  Knee pain can be minor or severe. Knee pain can accompany debilitating injury. Minor knee problems often respond well to self-care measures or get well on their own. More serious injuries may need medical intervention or even surgery. SYMPTOMS The knee is complex. Symptoms of knee problems can vary widely. Some of the problems are:  Pain with movement and weight bearing.   Swelling and tenderness.   Buckling of the knee.   Inability to straighten or extend your knee.   Your knee locks and you cannot straighten it.   Warmth and redness with pain and fever.   Deformity or dislocation of the kneecap.  DIAGNOSIS  Determining what is wrong may be very straight forward such as when there is an injury. It can also be challenging because of the complexity of the knee. Tests to make a diagnosis may include:  Your caregiver taking a history and doing a physical exam.   Routine X-rays can be used to rule out other problems. X-rays will not reveal a cartilage tear. Some injuries of the knee can be diagnosed by:   Arthroscopy a surgical technique by which a small video camera is inserted through tiny incisions on the sides of the knee. This procedure is used to examine and repair internal knee joint problems. Tiny instruments can be used during arthroscopy to repair the torn knee cartilage  (meniscus).   Arthrography is a radiology technique. A contrast liquid is directly injected into the knee joint. Internal structures of the knee joint then become visible on X-ray film.   An MRI scan is a non x-ray radiology procedure in which magnetic fields and a computer produce two- or three-dimensional images of the inside of the knee. Cartilage tears are often visible using an MRI scanner. MRI scans have largely replaced arthrography in diagnosing cartilage tears of the knee.   Blood work.   Examination of the fluid that helps to lubricate the knee joint (synovial fluid). This is done by taking a sample out using a needle and a syringe.  TREATMENT The treatment of knee problems depends on the cause. Some of these treatments are:  Depending on the injury, proper casting, splinting, surgery or physical therapy care will be needed.   Give yourself adequate recovery time. Do not overuse your joints. If you begin to get sore during workout routines, back off. Slow down or do fewer repetitions.   For repetitive activities such as cycling or running, maintain your strength and nutrition.   Alternate muscle groups. For example if you are a weight lifter, work the upper body on one day and the lower body the next.   Either tight or weak muscles do not give the proper support for your knee. Tight or weak muscles do not absorb the stress placed   on the knee joint. Keep the muscles surrounding the knee strong.   Take care of mechanical problems.   If you have flat feet, orthotics or special shoes may help. See your caregiver if you need help.   Arch supports, sometimes with wedges on the inner or outer aspect of the heel, can help. These can shift pressure away from the side of the knee most bothered by osteoarthritis.   A brace called an "unloader" brace also may be used to help ease the pressure on the most arthritic side of the knee.   If your caregiver has prescribed crutches, braces,  wraps or ice, use as directed. The acronym for this is PRICE. This means protection, rest, ice, compression and elevation.   Nonsteroidal anti-inflammatory drugs (NSAID's), can help relieve pain. But if taken immediately after an injury, they may actually increase swelling. Take NSAID's with food in your stomach. Stop them if you develop stomach problems. Do not take these if you have a history of ulcers, stomach pain or bleeding from the bowel. Do not take without your caregiver's approval if you have problems with fluid retention, heart failure, or kidney problems.   For ongoing knee problems, physical therapy may be helpful.   Glucosamine and chondroitin are over-the-counter dietary supplements. Both may help relieve the pain of osteoarthritis in the knee. These medicines are different from the usual anti-inflammatory drugs. Glucosamine may decrease the rate of cartilage destruction.   Injections of a corticosteroid drug into your knee joint may help reduce the symptoms of an arthritis flare-up. They may provide pain relief that lasts a few months. You may have to wait a few months between injections. The injections do have a small increased risk of infection, water retention and elevated blood sugar levels.   Hyaluronic acid injected into damaged joints may ease pain and provide lubrication. These injections may work by reducing inflammation. A series of shots may give relief for as long as 6 months.   Topical painkillers. Applying certain ointments to your skin may help relieve the pain and stiffness of osteoarthritis. Ask your pharmacist for suggestions. Many over the-counter products are approved for temporary relief of arthritis pain.   In some countries, doctors often prescribe topical NSAID's for relief of chronic conditions such as arthritis and tendinitis. A review of treatment with NSAID creams found that they worked as well as oral medications but without the serious side effects.    PREVENTION  Maintain a healthy weight. Extra pounds put more strain on your joints.   Get strong, stay limber. Weak muscles are a common cause of knee injuries. Stretching is important. Include flexibility exercises in your workouts.   Be smart about exercise. If you have osteoarthritis, chronic knee pain or recurring injuries, you may need to change the way you exercise. This does not mean you have to stop being active. If your knees ache after jogging or playing basketball, consider switching to swimming, water aerobics or other low-impact activities, at least for a few days a week. Sometimes limiting high-impact activities will provide relief.   Make sure your shoes fit well. Choose footwear that is right for your sport.   Protect your knees. Use the proper gear for knee-sensitive activities. Use kneepads when playing volleyball or laying carpet. Buckle your seat belt every time you drive. Most shattered kneecaps occur in car accidents.   Rest when you are tired.  SEEK MEDICAL CARE IF:  You have knee pain that is continual and does not   seem to be getting better.  SEEK IMMEDIATE MEDICAL CARE IF:  Your knee joint feels hot to the touch and you have a high fever. MAKE SURE YOU:   Understand these instructions.   Will watch your condition.   Will get help right away if you are not doing well or get worse.  Document Released: 10/14/2007 Document Revised: 12/06/2011 Document Reviewed: 10/14/2007 [employer] Patient Information 2012 [employer]. 
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
## 2012-03-29 NOTE — ED Provider Notes (Signed)
Medical screening examination/treatment/procedure(s) were performed by non-physician practitioner and as supervising physician I was immediately available for consultation/collaboration.  Alen Bleacher, MD 03/29/12 [phone]
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
## 2012-04-02 ENCOUNTER — Encounter (INDEPENDENT_AMBULATORY_CARE_PROVIDER_SITE_OTHER): Payer: Managed Care, Other (non HMO) | Admitting: Obstetrics and Gynecology

## 2012-04-02 DIAGNOSIS — N920 Excessive and frequent menstruation with regular cycle: Secondary | ICD-10-CM

## 2012-04-02 DIAGNOSIS — Z202 Contact with and (suspected) exposure to infections with a predominantly sexual mode of transmission: Secondary | ICD-10-CM

## 2012-04-02 DIAGNOSIS — Z01419 Encounter for gynecological examination (general) (routine) without abnormal findings: Secondary | ICD-10-CM

## 2012-04-03 ENCOUNTER — Other Ambulatory Visit: Payer: Self-pay

## 2012-04-04 ENCOUNTER — Encounter: Payer: Self-pay | Admitting: Physician Assistant

## 2012-04-04 ENCOUNTER — Ambulatory Visit (HOSPITAL_BASED_OUTPATIENT_CLINIC_OR_DEPARTMENT_OTHER): Payer: Managed Care, Other (non HMO) | Admitting: Physician Assistant

## 2012-04-04 ENCOUNTER — Telehealth: Payer: Self-pay | Admitting: *Deleted

## 2012-04-04 ENCOUNTER — Other Ambulatory Visit (HOSPITAL_BASED_OUTPATIENT_CLINIC_OR_DEPARTMENT_OTHER): Payer: Managed Care, Other (non HMO) | Admitting: Lab

## 2012-04-04 ENCOUNTER — Ambulatory Visit: Payer: Managed Care, Other (non HMO) | Admitting: Physician Assistant

## 2012-04-04 VITALS — BP 134/82 | HR 83 | Temp 98.4°F | Ht 69.0 in | Wt 247.8 lb

## 2012-04-04 DIAGNOSIS — D693 Immune thrombocytopenic purpura: Secondary | ICD-10-CM

## 2012-04-04 DIAGNOSIS — D509 Iron deficiency anemia, unspecified: Secondary | ICD-10-CM

## 2012-04-04 LAB — COMPREHENSIVE METABOLIC PANEL
CO2: 25 mEq/L (ref 19–32)
Creatinine, Ser: 0.69 mg/dL (ref 0.50–1.10)
Glucose, Bld: 109 mg/dL — ABNORMAL HIGH (ref 70–99)
Total Bilirubin: 0.6 mg/dL (ref 0.3–1.2)

## 2012-04-04 LAB — CBC WITH DIFFERENTIAL/PLATELET
Basophils Absolute: 0 10*3/uL (ref 0.0–0.1)
Eosinophils Absolute: 0 10*3/uL (ref 0.0–0.5)
LYMPH%: 7.4 % — ABNORMAL LOW (ref 14.0–49.7)
MCV: 78.7 fL — ABNORMAL LOW (ref 79.5–101.0)
MONO%: 6.5 % (ref 0.0–14.0)
NEUT#: 15.7 10*3/uL — ABNORMAL HIGH (ref 1.5–6.5)
Platelets: 310 10*3/uL (ref 145–400)
RBC: 4.93 10*6/uL (ref 3.70–5.45)
nRBC: 0 % (ref 0–0)

## 2012-04-04 NOTE — Telephone Encounter (Signed)
made patient appointment for 04-25-2012 printed out calendar and gave to the patient

## 2012-04-04 NOTE — Progress Notes (Signed)
Hematology and Oncology Follow Up Visit  Bailey Sheppard [id number] Apr 18, 1981 31 y.o. 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 She is currently on prednisone 60 mg per day for platelet count of 18,000 noted on 02/21/201 at time of presentaion, with normalization of platelet count active prednisone taper. 2. Iron deficiency anemia secondary to menorrhagia s/p IV iron on 02/29/12, normalization of Hgb.   Interim History:   Bailey Sheppard is seen today for followup pertaining to her history of ITP, on a very slow prednisone taper, current dose of prednisone at 60 milligrams per day. She is tolerating it well. She denies fevers, chills, or night sweats. Her overall energy level is fairly good, she denies shortness of breath or chest pain. She started her menstrual period today, she does not note any heavy flow. She underwent a Pap/pelvic exam with Dr. Pennie Rushing earlier this week she denies any nausea or emesis issues that she has appreciated a little heartburn. No hematochezia or melena. She did sustain a ligament injury to her left knee, she [facility] 03/29/2012.   Review of Systems: Constitutional:  no weight loss, fever, night sweats and feels well Eyes: no complaints ENT: no complaints Cardiovascular: no chest pain or dyspnea on exertion Respiratory: no cough, shortness of breath, or wheezing Neurological: no TIA or stroke symptoms Dermatological: negative Gastrointestinal: no abdominal pain, change in bowel habits, or black or bloody stools Genito-Urinary: no dysuria, trouble voiding, or hematuria Hematological and Lymphatic: negative Breast: negative Musculoskeletal: negative Remaining ROS negative.   Medications:   I have reviewed the patient's current medications.  Current Outpatient Prescriptions  Medication Sig Dispense Refill  .  HYDROcodone-acetaminophen (NORCO) 5-325 MG per tablet Take 1-2 tablets by mouth every 6 (six) hours as needed for pain.  20 tablet  0  . predniSONE (DELTASONE) 10 MG tablet Take 1 tablet (10 mg total) by mouth as directed. Take as directed.  100 tablet  0    Allergies: No Known Allergies  Physical Exam: Filed Vitals:   04/04/12 1145  BP: 134/82  Pulse: 83  Temp: 98.4 F (36.9 C)    Body mass index is 36.59 kg/(m^2). Weight: 247 lbs. HEENT:  Sclerae anicteric, conjunctivae pink.  Oropharynx clear.  No mucositis or candidiasis.   Nodes:  No cervical, supraclavicular, or axillary lymphadenopathy palpated.  Lungs:  Clear to auscultation bilaterally.  No crackles, rhonchi, or wheezes.   Heart:  Regular rate and rhythm.   Abdomen:  Soft, nontender.  Positive bowel sounds.  No organomegaly or masses palpated.   Musculoskeletal:  No focal spinal tenderness to palpation.  Extremities:  Benign.  No peripheral edema or cyanosis.   Skin:  Benign.   Neuro:  Nonfocal, alert and oriented x 3.   Lab Results: Lab Results  Component Value Date   WBC 18.3* 04/04/2012   HGB 12.0 04/04/2012   HCT 38.8 04/04/2012   MCV 78.7* 04/04/2012   PLT 310 04/04/2012   NEUTROABS 15.7* 04/04/2012     Chemistry      Component Value Date/Time   NA 141 03/21/2012 1015   K 3.6 03/21/2012 1015   CL 103 03/21/2012 1015   CO2 29 03/21/2012 1015   BUN 15 03/21/2012 1015   CREATININE 0.81 03/21/2012 1015      Component Value Date/Time   CALCIUM 9.3 03/21/2012 1015   ALKPHOS 48 03/21/2012 1015  AST 11 03/21/2012 1015   ALT 16 03/21/2012 1015   BILITOT 0.6 03/21/2012 1015      No results found for this basename: LABCA2     Assessment:  Bailey Sheppard is a 31 year old [place] 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 She is currently on prednisone 60 mg per day for platelet count of 18,000 noted on 02/21/201 at time of presentaion, with normalization of platelet count  active prednisone taper. 2. Iron deficiency anemia secondary to menorrhagia s/p IV iron on 02/29/12, normalization of Hgb.  Case reviewed with Dr. Pierce Crane.  Plan:  Shaneque will decrease her prednisone dose to 50 mg per day for the next week, we'll plan on rechecking a CBC on 4/12, and 04/18/12, and if her platelets continue to look quite good we will continue her slow prednisone taper. I will see her back on 04/25/2012 for followup exam with CBC, serum chemistry, and iron studies obtained prior. She knows to contact us sooner if the need should arise. This plan was reviewed with the patient, who voices understanding and agreement.  She knows to call with any changes or problems.    Tyrick Dunagan T, PA-C 04/04/2012

## 2012-04-11 ENCOUNTER — Other Ambulatory Visit (HOSPITAL_BASED_OUTPATIENT_CLINIC_OR_DEPARTMENT_OTHER): Payer: Managed Care, Other (non HMO) | Admitting: Lab

## 2012-04-11 DIAGNOSIS — D509 Iron deficiency anemia, unspecified: Secondary | ICD-10-CM

## 2012-04-11 DIAGNOSIS — D693 Immune thrombocytopenic purpura: Secondary | ICD-10-CM

## 2012-04-11 LAB — CBC WITH DIFFERENTIAL/PLATELET
Eosinophils Absolute: 0.1 10*3/uL (ref 0.0–0.5)
HCT: 36 % (ref 34.8–46.6)
HGB: 11.5 g/dL — ABNORMAL LOW (ref 11.6–15.9)
LYMPH%: 19.3 % (ref 14.0–49.7)
MONO#: 1.6 10*3/uL — ABNORMAL HIGH (ref 0.1–0.9)
NEUT#: 9.8 10*3/uL — ABNORMAL HIGH (ref 1.5–6.5)
NEUT%: 68.9 % (ref 38.4–76.8)
Platelets: 245 10*3/uL (ref 145–400)
WBC: 14.2 10*3/uL — ABNORMAL HIGH (ref 3.9–10.3)

## 2012-04-18 ENCOUNTER — Other Ambulatory Visit (HOSPITAL_BASED_OUTPATIENT_CLINIC_OR_DEPARTMENT_OTHER): Payer: Managed Care, Other (non HMO) | Admitting: Lab

## 2012-04-18 DIAGNOSIS — D509 Iron deficiency anemia, unspecified: Secondary | ICD-10-CM

## 2012-04-18 DIAGNOSIS — D693 Immune thrombocytopenic purpura: Secondary | ICD-10-CM

## 2012-04-18 LAB — COMPREHENSIVE METABOLIC PANEL
Albumin: 4.1 g/dL (ref 3.5–5.2)
Alkaline Phosphatase: 43 U/L (ref 39–117)
BUN: 12 mg/dL (ref 6–23)
CO2: 27 mEq/L (ref 19–32)
Calcium: 9.5 mg/dL (ref 8.4–10.5)
Glucose, Bld: 81 mg/dL (ref 70–99)
Potassium: 3.9 mEq/L (ref 3.5–5.3)

## 2012-04-18 LAB — CBC WITH DIFFERENTIAL/PLATELET
Basophils Absolute: 0.1 10*3/uL (ref 0.0–0.1)
EOS%: 0.6 % (ref 0.0–7.0)
Eosinophils Absolute: 0.1 10*3/uL (ref 0.0–0.5)
HCT: 36.7 % (ref 34.8–46.6)
HGB: 11.9 g/dL (ref 11.6–15.9)
MCH: 25.7 pg (ref 25.1–34.0)
MCV: 79.5 fL (ref 79.5–101.0)
MONO%: 7.5 % (ref 0.0–14.0)
NEUT%: 70.3 % (ref 38.4–76.8)
lymph#: 3.2 10*3/uL (ref 0.9–3.3)

## 2012-04-21 ENCOUNTER — Telehealth: Payer: Self-pay

## 2012-04-21 NOTE — Telephone Encounter (Signed)
PC TO PT PER PAP SMEAR RESULTS. TOLD PT PAP SMEAR ABNORMAL. NO CANCER OR PRECANCEROUS CELLS FOUND. NEEDS COLPOSCOPY PER [facility]. PROCEDURE EXPLAINED TO PT IN DETAIL. INFORMED NO IC OR ANYTHING PV 24HRS PRIOR TO APPT AND CAN TAKE OTC TYLENOL OR IBUPROFEN PRIOR TO APPT. PT VOICES UNDERSTANDING. APPT SCHED 05/14/12@4 :00P WITH [facility].

## 2012-04-24 ENCOUNTER — Other Ambulatory Visit: Payer: Self-pay | Admitting: Physician Assistant

## 2012-04-24 DIAGNOSIS — D693 Immune thrombocytopenic purpura: Secondary | ICD-10-CM

## 2012-04-25 ENCOUNTER — Ambulatory Visit (HOSPITAL_BASED_OUTPATIENT_CLINIC_OR_DEPARTMENT_OTHER): Payer: Managed Care, Other (non HMO) | Admitting: Physician Assistant

## 2012-04-25 ENCOUNTER — Encounter: Payer: Self-pay | Admitting: Physician Assistant

## 2012-04-25 ENCOUNTER — Other Ambulatory Visit (HOSPITAL_BASED_OUTPATIENT_CLINIC_OR_DEPARTMENT_OTHER): Payer: Managed Care, Other (non HMO) | Admitting: Lab

## 2012-04-25 ENCOUNTER — Telehealth: Payer: Self-pay | Admitting: Oncology

## 2012-04-25 VITALS — BP 133/84 | HR 76 | Temp 98.2°F | Ht 69.0 in | Wt 246.3 lb

## 2012-04-25 DIAGNOSIS — D693 Immune thrombocytopenic purpura: Secondary | ICD-10-CM

## 2012-04-25 DIAGNOSIS — D509 Iron deficiency anemia, unspecified: Secondary | ICD-10-CM

## 2012-04-25 DIAGNOSIS — N920 Excessive and frequent menstruation with regular cycle: Secondary | ICD-10-CM

## 2012-04-25 LAB — CBC WITH DIFFERENTIAL/PLATELET
Basophils Absolute: 0 10*3/uL (ref 0.0–0.1)
Eosinophils Absolute: 0.1 10*3/uL (ref 0.0–0.5)
HGB: 11.9 g/dL (ref 11.6–15.9)
MCV: 77.6 fL — ABNORMAL LOW (ref 79.5–101.0)
MONO#: 0.9 10*3/uL (ref 0.1–0.9)
NEUT#: 8.8 10*3/uL — ABNORMAL HIGH (ref 1.5–6.5)
RBC: 4.64 10*6/uL (ref 3.70–5.45)
RDW: 24.9 % — ABNORMAL HIGH (ref 11.2–14.5)
WBC: 12.9 10*3/uL — ABNORMAL HIGH (ref 3.9–10.3)
lymph#: 3 10*3/uL (ref 0.9–3.3)
nRBC: 0 % (ref 0–0)

## 2012-04-25 LAB — COMPREHENSIVE METABOLIC PANEL
ALT: 16 U/L (ref 0–35)
Albumin: 4.1 g/dL (ref 3.5–5.2)
CO2: 28 mEq/L (ref 19–32)
Chloride: 102 mEq/L (ref 96–112)
Glucose, Bld: 85 mg/dL (ref 70–99)
Potassium: 3.7 mEq/L (ref 3.5–5.3)
Sodium: 140 mEq/L (ref 135–145)
Total Bilirubin: 0.8 mg/dL (ref 0.3–1.2)
Total Protein: 6.4 g/dL (ref 6.0–8.3)

## 2012-04-25 LAB — IRON AND TIBC
Iron: 49 ug/dL (ref 42–145)
UIBC: 342 ug/dL (ref 125–400)

## 2012-04-25 NOTE — Progress Notes (Signed)
Hematology and Oncology Follow Up Visit  Bailey Sheppard [id number] 1981-03-20 31 y.o. 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 She is currently on prednisone 30 mg per day for platelet count of 18,000 noted on 02/21/201 at time of presentaion, with normalization of platelet count active prednisone taper. 2. Iron deficiency anemia secondary to menorrhagia s/p IV iron on 02/29/12, normalization of Hgb.   Interim History:   Bailey Sheppard is seen today for followup pertaining to her history of ITP, on a very slow prednisone taper, current dose of prednisone at 30 milligrams per day. She is tolerating it well. She denies fevers, chills, or night sweats. Her overall energy level is fairly good, she denies shortness of breath or chest pain. She started her menstrual period today, she does not note any heavy flow. She underwent a Pap/pelvic exam with Dr. Pennie Rushing, states that he can, she will be undergoing a followup with Dr. Pennie Rushing on 05/05/2012 and colposcopy on 05/14/2012. She denies any nausea or emesis issues that she has appreciated a little heartburn. No hematochezia or melena. She did sustain a ligament injury to her left knee, she was seen in the [facility] on 03/29/2012, she underwent attempted left knee aspiration on 04/21/2012, she had no difficulty with bleeding thereafter. This was performed at [facility] orthopedics. Review of systems as below.    Review of Systems: Constitutional:  no weight loss, fever, night sweats and feels well Eyes: no complaints ENT: no complaints Cardiovascular: no chest pain or dyspnea on exertion Respiratory: no cough, shortness of breath, or wheezing Neurological: no TIA or stroke symptoms Dermatological: negative Gastrointestinal: no abdominal pain, change in bowel habits, or black or bloody stools Genito-Urinary: no dysuria, trouble voiding, or  hematuria Hematological and Lymphatic: negative Breast: negative Musculoskeletal: L knee pain Remaining ROS negative.   Medications:   I have reviewed the patient's current medications.  Current Outpatient Prescriptions  Medication Sig Dispense Refill  . predniSONE (DELTASONE) 10 MG tablet Take 1 tablet (10 mg total) by mouth as directed. Take as directed.  100 tablet  0    Allergies: No Known Allergies  Physical Exam: Filed Vitals:   04/25/12 0942  BP: 133/84  Pulse: 76  Temp: 98.2 F (36.8 C)    Body mass index is 36.37 kg/(m^2). Weight: 246 lbs. HEENT:  Sclerae anicteric, conjunctivae pink.  Oropharynx clear.  No mucositis or candidiasis.   Nodes:  No cervical, supraclavicular, or axillary lymphadenopathy palpated.  Lungs:  Clear to auscultation bilaterally.  No crackles, rhonchi, or wheezes.   Heart:  Regular rate and rhythm.   Abdomen:  Soft, nontender.  Positive bowel sounds.  No organomegaly or masses palpated.   Musculoskeletal:  No focal spinal tenderness to palpation.  Extremities:  Benign.  No peripheral edema or cyanosis.   Skin:  Benign.   Neuro:  Nonfocal, alert and oriented x 3.   Lab Results: Lab Results  Component Value Date   WBC 12.9* 04/25/2012   HGB 11.9 04/25/2012   HCT 36.0 04/25/2012   MCV 77.6* 04/25/2012   PLT 219 04/25/2012   NEUTROABS 8.8* 04/25/2012     Chemistry      Component Value Date/Time   NA 140 04/18/2012 1011   K 3.9 04/18/2012 1011   CL 103 04/18/2012 1011   CO2 27 04/18/2012 1011   BUN 12 04/18/2012 1011   CREATININE 0.75 04/18/2012 1011  Component Value Date/Time   CALCIUM 9.5 04/18/2012 1011   ALKPHOS 43 04/18/2012 1011   AST 13 04/18/2012 1011   ALT 19 04/18/2012 1011   BILITOT 0.8 04/18/2012 1011      No results found for this basename: LABCA2     Assessment:  Bailey Sheppard is a 31 year old [place] 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 She is currently on  prednisone 30 mg per day for platelet count of 18,000 noted on 02/21/201 at time of presentaion, with normalization of platelet count active prednisone taper. 2. Iron deficiency anemia secondary to menorrhagia s/p IV iron on 02/29/12, normalization of Hgb.  Case reviewed with Dr. Pierce Crane.  Plan:  Earsie will decrease her prednisone dose to 20 mg per day for the next week, we'll plan on rechecking a CBC on 5/3, and 05/09/12, and if her platelets continue to look quite good we will continue her slow prednisone taper. I will see her back on 05/16/12 for followup exam with CBC and serum chemisty obtained prior. She knows to contact us sooner if the need should arise. This plan was reviewed with the patient, who voices understanding and agreement.  She knows to call with any changes or problems.    Bailey Sheppard T, PA-C 04/25/2012

## 2012-04-25 NOTE — Telephone Encounter (Signed)
gv pt appt schedule for may. Per CS lb only 5/3 and 5/10 and lb/CS 5/17.

## 2012-04-30 HISTORY — PX: INTRAUTERINE DEVICE (IUD) INSERTION: SHX5877

## 2012-05-02 ENCOUNTER — Other Ambulatory Visit (HOSPITAL_BASED_OUTPATIENT_CLINIC_OR_DEPARTMENT_OTHER): Payer: Managed Care, Other (non HMO) | Admitting: Lab

## 2012-05-02 ENCOUNTER — Other Ambulatory Visit: Payer: Self-pay | Admitting: Obstetrics and Gynecology

## 2012-05-02 DIAGNOSIS — D219 Benign neoplasm of connective and other soft tissue, unspecified: Secondary | ICD-10-CM

## 2012-05-02 DIAGNOSIS — D509 Iron deficiency anemia, unspecified: Secondary | ICD-10-CM

## 2012-05-02 DIAGNOSIS — D693 Immune thrombocytopenic purpura: Secondary | ICD-10-CM

## 2012-05-02 LAB — CBC WITH DIFFERENTIAL/PLATELET
BASO%: 0.1 % (ref 0.0–2.0)
Eosinophils Absolute: 0.1 10*3/uL (ref 0.0–0.5)
HCT: 34.7 % — ABNORMAL LOW (ref 34.8–46.6)
LYMPH%: 22.7 % (ref 14.0–49.7)
MCHC: 33.1 g/dL (ref 31.5–36.0)
MCV: 78 fL — ABNORMAL LOW (ref 79.5–101.0)
MONO#: 0.9 10*3/uL (ref 0.1–0.9)
MONO%: 8.1 % (ref 0.0–14.0)
NEUT%: 67.8 % (ref 38.4–76.8)
Platelets: 188 10*3/uL (ref 145–400)
RBC: 4.45 10*6/uL (ref 3.70–5.45)
WBC: 10.8 10*3/uL — ABNORMAL HIGH (ref 3.9–10.3)

## 2012-05-05 ENCOUNTER — Encounter: Payer: Managed Care, Other (non HMO) | Admitting: Obstetrics and Gynecology

## 2012-05-09 ENCOUNTER — Ambulatory Visit: Payer: Managed Care, Other (non HMO) | Admitting: Physician Assistant

## 2012-05-09 ENCOUNTER — Other Ambulatory Visit: Payer: Managed Care, Other (non HMO) | Admitting: Lab

## 2012-05-09 ENCOUNTER — Other Ambulatory Visit (HOSPITAL_BASED_OUTPATIENT_CLINIC_OR_DEPARTMENT_OTHER): Payer: Managed Care, Other (non HMO) | Admitting: Lab

## 2012-05-09 DIAGNOSIS — D693 Immune thrombocytopenic purpura: Secondary | ICD-10-CM

## 2012-05-09 DIAGNOSIS — D509 Iron deficiency anemia, unspecified: Secondary | ICD-10-CM

## 2012-05-09 LAB — CBC WITH DIFFERENTIAL/PLATELET
BASO%: 0.4 % (ref 0.0–2.0)
Basophils Absolute: 0 10*3/uL (ref 0.0–0.1)
EOS%: 1.4 % (ref 0.0–7.0)
Eosinophils Absolute: 0.1 10*3/uL (ref 0.0–0.5)
HCT: 35.1 % (ref 34.8–46.6)
HGB: 11.1 g/dL — ABNORMAL LOW (ref 11.6–15.9)
LYMPH%: 24.9 % (ref 14.0–49.7)
MCH: 25.5 pg (ref 25.1–34.0)
MCHC: 31.6 g/dL (ref 31.5–36.0)
MCV: 80.6 fL (ref 79.5–101.0)
MONO#: 0.8 10*3/uL (ref 0.1–0.9)
MONO%: 8.4 % (ref 0.0–14.0)
NEUT#: 6 10*3/uL (ref 1.5–6.5)
NEUT%: 64.9 % (ref 38.4–76.8)
Platelets: 209 10*3/uL (ref 145–400)
RBC: 4.36 10*6/uL (ref 3.70–5.45)
RDW: 25 % — ABNORMAL HIGH (ref 11.2–14.5)
WBC: 9.3 10*3/uL (ref 3.9–10.3)
lymph#: 2.3 10*3/uL (ref 0.9–3.3)
nRBC: 0 % (ref 0–0)

## 2012-05-13 ENCOUNTER — Encounter: Payer: Self-pay | Admitting: Obstetrics and Gynecology

## 2012-05-13 ENCOUNTER — Ambulatory Visit (INDEPENDENT_AMBULATORY_CARE_PROVIDER_SITE_OTHER): Payer: Managed Care, Other (non HMO)

## 2012-05-13 ENCOUNTER — Other Ambulatory Visit: Payer: Self-pay | Admitting: Obstetrics and Gynecology

## 2012-05-13 ENCOUNTER — Ambulatory Visit (INDEPENDENT_AMBULATORY_CARE_PROVIDER_SITE_OTHER): Payer: Managed Care, Other (non HMO) | Admitting: Obstetrics and Gynecology

## 2012-05-13 VITALS — BP 112/80 | HR 72 | Ht 70.0 in | Wt 249.0 lb

## 2012-05-13 DIAGNOSIS — D219 Benign neoplasm of connective and other soft tissue, unspecified: Secondary | ICD-10-CM

## 2012-05-13 DIAGNOSIS — D259 Leiomyoma of uterus, unspecified: Secondary | ICD-10-CM

## 2012-05-13 DIAGNOSIS — Z30430 Encounter for insertion of intrauterine contraceptive device: Secondary | ICD-10-CM

## 2012-05-13 DIAGNOSIS — Z975 Presence of (intrauterine) contraceptive device: Secondary | ICD-10-CM

## 2012-05-13 LAB — POCT URINE PREGNANCY: Preg Test, Ur: NEGATIVE

## 2012-05-13 MED ORDER — LEVONORGESTREL 20 MCG/24HR IU IUD
INTRAUTERINE_SYSTEM | Freq: Once | INTRAUTERINE | Status: AC
  Administered 2012-05-13: 17:00:00 via INTRAUTERINE

## 2012-05-13 NOTE — Patient Instructions (Addendum)
Give brochure on fibroids  Schedule follow up of IUD in 4 weeks  Call [facility] [phone]:  -for temperature of 100.4 degrees Fahrenheit or more -pain not improved with over the counter pain medications (Ibuprofen, Advil, Aleve,        Tylenol or acetaminophen) -for excessive bleeding (more than a usual period) -for any other concerns  Do not place anything in your vagina for the next 7 days

## 2012-05-13 NOTE — Progress Notes (Signed)
IUD INSERTION NOTE  Bailey Sheppard is a 31 y.o. female No obstetric history on file. who presents for IUD insertion. Patient has a history of a fibroid and here for ultrasound follow-up. Denies any symptoms associated with her fibroid.  Consent signed after risks and benefits were reviewed including but not limited to bleeding, infection, expulsion and risk of uterine perforation that may require an additional procedure for removal.  LMP: Patient's last menstrual period was 05/03/2012. UPT: negative  GC / Chlamydia: negative, 04/08/12  MIRENA LOT NUMBER: [id number]   Prepped with Betadine  Tenaculum placed on anterior lip of cervix after Hurricane gel was applied Uterus sounded at  8 cm Insertion of MIRENA IUD per protocol without any complications  U/S= uterus 10.4 x 6.92 x 5.06 with right lateral subserosal fibroid 3.9 x 3.5 x 3.8 cm;  both ovaries appeared normal  Assessment:  IUD Insertion (Mirena)                        Single Right Sub-serosal fibroid < 4cm   Plan:  1. Patient instructed to call with oral temperature of 100.4 degrees Fahrenheit or more, excessive bleeding or pain that is not relieved with OTC analgesia taken as directed  2. Patient instructed on how  to check IUD strings and encouraged to do so after each menstrual cycle  3. Advised not to place anything in vagina or have sexual intercourse for 7 days  4. Follow-up: 4 weeks  5. Reviewed fibroids management, evaluation and potential     effects.   Since asymptomatic, patient to observe for now.  Bailey Cupo MD 05/13/2012 3:09 PM

## 2012-05-14 ENCOUNTER — Encounter: Payer: Managed Care, Other (non HMO) | Admitting: Obstetrics and Gynecology

## 2012-05-15 ENCOUNTER — Other Ambulatory Visit (HOSPITAL_BASED_OUTPATIENT_CLINIC_OR_DEPARTMENT_OTHER): Payer: Managed Care, Other (non HMO) | Admitting: Lab

## 2012-05-15 DIAGNOSIS — D693 Immune thrombocytopenic purpura: Secondary | ICD-10-CM

## 2012-05-15 DIAGNOSIS — D509 Iron deficiency anemia, unspecified: Secondary | ICD-10-CM

## 2012-05-15 LAB — COMPREHENSIVE METABOLIC PANEL
Albumin: 3.5 g/dL (ref 3.5–5.2)
Alkaline Phosphatase: 52 U/L (ref 39–117)
BUN: 6 mg/dL (ref 6–23)
CO2: 26 mEq/L (ref 19–32)
Calcium: 9 mg/dL (ref 8.4–10.5)
Chloride: 99 mEq/L (ref 96–112)
Glucose, Bld: 145 mg/dL — ABNORMAL HIGH (ref 70–99)
Potassium: 3 mEq/L — ABNORMAL LOW (ref 3.5–5.3)
Sodium: 136 mEq/L (ref 135–145)
Total Protein: 7 g/dL (ref 6.0–8.3)

## 2012-05-15 LAB — CBC WITH DIFFERENTIAL/PLATELET
Basophils Absolute: 0 10*3/uL (ref 0.0–0.1)
Eosinophils Absolute: 0.1 10*3/uL (ref 0.0–0.5)
HGB: 11.9 g/dL (ref 11.6–15.9)
MCV: 76.8 fL — ABNORMAL LOW (ref 79.5–101.0)
MONO#: 1.3 10*3/uL — ABNORMAL HIGH (ref 0.1–0.9)
NEUT#: 11.4 10*3/uL — ABNORMAL HIGH (ref 1.5–6.5)
RBC: 4.61 10*6/uL (ref 3.70–5.45)
RDW: 21.3 % — ABNORMAL HIGH (ref 11.2–14.5)
WBC: 14.8 10*3/uL — ABNORMAL HIGH (ref 3.9–10.3)
lymph#: 2.1 10*3/uL (ref 0.9–3.3)

## 2012-05-16 ENCOUNTER — Ambulatory Visit: Payer: Managed Care, Other (non HMO) | Admitting: Physician Assistant

## 2012-05-16 ENCOUNTER — Other Ambulatory Visit: Payer: Managed Care, Other (non HMO) | Admitting: Lab

## 2012-05-23 ENCOUNTER — Encounter: Payer: Self-pay | Admitting: *Deleted

## 2012-05-23 ENCOUNTER — Telehealth: Payer: Self-pay | Admitting: *Deleted

## 2012-05-23 NOTE — Telephone Encounter (Signed)
gave patient appointment for 07-15-2012 starting at 10:45am  patient confirmed over the phone the new date and time

## 2012-07-14 ENCOUNTER — Other Ambulatory Visit: Payer: Self-pay | Admitting: Physician Assistant

## 2012-07-14 DIAGNOSIS — D693 Immune thrombocytopenic purpura: Secondary | ICD-10-CM

## 2012-07-15 ENCOUNTER — Ambulatory Visit (HOSPITAL_BASED_OUTPATIENT_CLINIC_OR_DEPARTMENT_OTHER): Payer: BC Managed Care – PPO | Admitting: Physician Assistant

## 2012-07-15 ENCOUNTER — Other Ambulatory Visit (HOSPITAL_BASED_OUTPATIENT_CLINIC_OR_DEPARTMENT_OTHER): Payer: BC Managed Care – PPO | Admitting: Lab

## 2012-07-15 VITALS — BP 129/82 | HR 49 | Temp 97.9°F | Ht 70.0 in | Wt 253.6 lb

## 2012-07-15 DIAGNOSIS — D693 Immune thrombocytopenic purpura: Secondary | ICD-10-CM

## 2012-07-15 DIAGNOSIS — D509 Iron deficiency anemia, unspecified: Secondary | ICD-10-CM

## 2012-07-15 LAB — COMPREHENSIVE METABOLIC PANEL
ALT: 42 U/L — ABNORMAL HIGH (ref 0–35)
AST: 86 U/L — ABNORMAL HIGH (ref 0–37)
Albumin: 4.1 g/dL (ref 3.5–5.2)
Calcium: 9.1 mg/dL (ref 8.4–10.5)
Chloride: 106 mEq/L (ref 96–112)
Creatinine, Ser: 0.67 mg/dL (ref 0.50–1.10)
Potassium: 3.8 mEq/L (ref 3.5–5.3)
Sodium: 139 mEq/L (ref 135–145)
Total Protein: 6.2 g/dL (ref 6.0–8.3)

## 2012-07-15 LAB — CBC WITH DIFFERENTIAL/PLATELET
BASO%: 0.3 % (ref 0.0–2.0)
EOS%: 3.5 % (ref 0.0–7.0)
MCH: 26.2 pg (ref 25.1–34.0)
MCHC: 32.5 g/dL (ref 31.5–36.0)
RBC: 4.26 10*6/uL (ref 3.70–5.45)
RDW: 18 % — ABNORMAL HIGH (ref 11.2–14.5)
lymph#: 1.5 10*3/uL (ref 0.9–3.3)

## 2012-07-16 NOTE — Progress Notes (Signed)
Hematology and Oncology Follow Up Visit  Bailey Sheppard [id number] March 16, 1981 31 y.o. 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 Recent course of slow prednisone taper for relapsed ITP, off steroids since 05/15/12.  2. Iron deficiency anemia secondary to menorrhagia s/p IV iron on 02/29/12, normalization of Hgb.   Interim History:   Bailey Sheppard is seen today for followup pertaining to her history of ITP,  For which she recently completed a slow prednisone taper as of 05/15/12.  She is currently feeling quite well, denying any fevers, chills, night sweats, no shortness of breath, or chest pain. She denies any bleeding or bruising symptoms. Of note, she had an IUD placed on 05/13/2012, and states that it has helped significantly with her history of heavy menses. Review of systems as below.    Review of Systems: Constitutional:  no weight loss, fever, night sweats and feels well Eyes: no complaints ENT: no complaints Cardiovascular: no chest pain or dyspnea on exertion Respiratory: no cough, shortness of breath, or wheezing Neurological: no TIA or stroke symptoms Dermatological: negative Gastrointestinal: no abdominal pain, change in bowel habits, or black or bloody stools Genito-Urinary: no dysuria, trouble voiding, or hematuria Hematological and Lymphatic: negative Breast: negative Musculoskeletal: L knee pain Remaining ROS negative.   Medications:   I have reviewed the patient's current medications.  No current outpatient prescriptions on file.    Allergies: No Known Allergies  Physical Exam: Filed Vitals:   07/15/12 1122  BP: 129/82  Pulse: 49  Temp: 97.9 F (36.6 C)    Body mass index is 36.39 kg/(m^2). Weight: 253 lbs. HEENT:  Sclerae anicteric, conjunctivae pink.  Oropharynx clear.  No mucositis or candidiasis.   Nodes:  No cervical, supraclavicular, or axillary  lymphadenopathy palpated.  Lungs:  Clear to auscultation bilaterally.  No crackles, rhonchi, or wheezes.   Heart:  Regular rate and rhythm.   Abdomen:  Soft, nontender.  Positive bowel sounds.  No organomegaly or masses palpated.   Musculoskeletal:  No focal spinal tenderness to palpation.  Extremities:  Benign.  No peripheral edema or cyanosis.   Skin:  Benign.   Neuro:  Nonfocal, alert and oriented x 3.   Lab Results: Lab Results  Component Value Date   WBC 5.4 07/15/2012   HGB 11.1* 07/15/2012   HCT 34.3* 07/15/2012   MCV 80.6 07/15/2012   PLT 178 07/15/2012   NEUTROABS 3.3 07/15/2012     Chemistry      Component Value Date/Time   NA 139 07/15/2012 1053   K 3.8 07/15/2012 1053   CL 106 07/15/2012 1053   CO2 27 07/15/2012 1053   BUN 8 07/15/2012 1053   CREATININE 0.67 07/15/2012 1053      Component Value Date/Time   CALCIUM 9.1 07/15/2012 1053   ALKPHOS 45 07/15/2012 1053   AST 86* 07/15/2012 1053   ALT 42* 07/15/2012 1053   BILITOT 0.9 07/15/2012 1053      No results found for this basename: LABCA2     Assessment:  Bailey Sheppard is a 31 year old [place] 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 Recent course of slow prednisone taper for relapsed ITP, off steroids since 05/15/12, normalization of platelet count.  2. Iron deficiency anemia secondary to menorrhagia s/p IV iron on 02/29/12, normalization of Hgb.  Case reviewed with Dr. Pierce Crane.  Plan:  Bailey Sheppard will remain on  observation alone.  We will obtain monthly CBCs over the next 3 months, with a scheduled follow up with Dr. Donnie Coffin in 3 months time. She knows to contact us sooner if the need should arise. This plan was reviewed with the patient, who voices understanding and agreement.  She knows to call with any changes or problems.    Aniesa Boback T, PA-C 07/15/12

## 2012-08-15 ENCOUNTER — Other Ambulatory Visit (HOSPITAL_BASED_OUTPATIENT_CLINIC_OR_DEPARTMENT_OTHER): Payer: BC Managed Care – PPO | Admitting: Lab

## 2012-08-15 DIAGNOSIS — D693 Immune thrombocytopenic purpura: Secondary | ICD-10-CM

## 2012-08-15 LAB — CBC WITH DIFFERENTIAL/PLATELET
Basophils Absolute: 0 10*3/uL (ref 0.0–0.1)
EOS%: 4.5 % (ref 0.0–7.0)
HGB: 11.8 g/dL (ref 11.6–15.9)
MCH: 26.2 pg (ref 25.1–34.0)
MCV: 79.8 fL (ref 79.5–101.0)
MONO%: 10.9 % (ref 0.0–14.0)
RDW: 18 % — ABNORMAL HIGH (ref 11.2–14.5)

## 2012-09-15 ENCOUNTER — Other Ambulatory Visit: Payer: BC Managed Care – PPO | Admitting: Lab

## 2012-10-15 ENCOUNTER — Other Ambulatory Visit: Payer: BC Managed Care – PPO | Admitting: Lab

## 2012-11-06 ENCOUNTER — Telehealth: Payer: Self-pay | Admitting: *Deleted

## 2012-11-06 NOTE — Telephone Encounter (Signed)
Patient called in to reschedule her lab only appointment to 11-06-2012 at 9:00am

## 2012-11-07 ENCOUNTER — Other Ambulatory Visit (HOSPITAL_BASED_OUTPATIENT_CLINIC_OR_DEPARTMENT_OTHER): Payer: BC Managed Care – PPO | Admitting: Lab

## 2012-11-07 DIAGNOSIS — D693 Immune thrombocytopenic purpura: Secondary | ICD-10-CM

## 2012-11-07 LAB — CBC WITH DIFFERENTIAL/PLATELET
BASO%: 0.3 % (ref 0.0–2.0)
EOS%: 1.5 % (ref 0.0–7.0)
MCH: 27.4 pg (ref 25.1–34.0)
MCV: 82.5 fL (ref 79.5–101.0)
MONO%: 7.8 % (ref 0.0–14.0)
NEUT#: 5.8 10*3/uL (ref 1.5–6.5)
RBC: 4.58 10*6/uL (ref 3.70–5.45)
RDW: 17.6 % — ABNORMAL HIGH (ref 11.2–14.5)

## 2012-11-14 ENCOUNTER — Other Ambulatory Visit: Payer: BC Managed Care – PPO | Admitting: Lab

## 2012-12-16 ENCOUNTER — Telehealth: Payer: Self-pay | Admitting: *Deleted

## 2012-12-16 ENCOUNTER — Other Ambulatory Visit (HOSPITAL_BASED_OUTPATIENT_CLINIC_OR_DEPARTMENT_OTHER): Payer: BC Managed Care – PPO | Admitting: Lab

## 2012-12-16 ENCOUNTER — Ambulatory Visit (HOSPITAL_BASED_OUTPATIENT_CLINIC_OR_DEPARTMENT_OTHER): Payer: BC Managed Care – PPO | Admitting: Oncology

## 2012-12-16 VITALS — BP 114/77 | HR 71 | Temp 99.0°F | Resp 20 | Ht 70.0 in | Wt 246.5 lb

## 2012-12-16 DIAGNOSIS — D693 Immune thrombocytopenic purpura: Secondary | ICD-10-CM

## 2012-12-16 LAB — CBC WITH DIFFERENTIAL/PLATELET
Eosinophils Absolute: 0.1 10*3/uL (ref 0.0–0.5)
MONO#: 0.4 10*3/uL (ref 0.1–0.9)
NEUT#: 4.9 10*3/uL (ref 1.5–6.5)
RBC: 4.58 10*6/uL (ref 3.70–5.45)
RDW: 16.8 % — ABNORMAL HIGH (ref 11.2–14.5)
WBC: 7.5 10*3/uL (ref 3.9–10.3)

## 2012-12-16 NOTE — Telephone Encounter (Signed)
Will call the patient for the new date and time

## 2012-12-16 NOTE — Progress Notes (Signed)
Hematology and Oncology Follow Up Visit  Shaneece Stockburger [id number] Dec 27, 1981 31 y.o. 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 Recent course of slow prednisone taper for relapsed ITP, off steroids since 05/15/12.  2. Iron deficiency anemia secondary to menorrhagia s/p IV iron on 02/29/12, normalization of Hgb.   Interim History:   Doreen is seen today for followup pertaining to her history of ITP,  For which she recently completed a slow prednisone taper as of 05/15/12.  She is currently feeling quite well, denying any fevers, chills, night sweats, no shortness of breath, or chest pain. She denies any bleeding or bruising symptoms. Of note, she had an IUD placed on 05/13/2012, and states that it has helped significantly with her history of heavy menses. Review of systems as below.    Review of Systems: Constitutional:  no weight loss, fever, night sweats and feels well Eyes: no complaints ENT: no complaints Cardiovascular: no chest pain or dyspnea on exertion Respiratory: no cough, shortness of breath, or wheezing Neurological: no TIA or stroke symptoms Dermatological: negative Gastrointestinal: no abdominal pain, change in bowel habits, or black or bloody stools Genito-Urinary: no dysuria, trouble voiding, or hematuria Hematological and Lymphatic: negative Breast: negative Musculoskeletal: L knee pain Remaining ROS negative.   Medications:   I have reviewed the patient's current medications.  No current outpatient prescriptions on file.    Allergies: No Known Allergies  Physical Exam: Filed Vitals:   12/16/12 1214  BP: 114/77  Pulse: 71  Temp: 99 F (37.2 C)  Resp: 20    Body mass index is 35.37 kg/(m^2). Weight: 253 lbs. HEENT:  Sclerae anicteric, conjunctivae pink.  Oropharynx clear.  No mucositis or candidiasis.   Nodes:  No cervical, supraclavicular, or  axillary lymphadenopathy palpated.  Lungs:  Clear to auscultation bilaterally.  No crackles, rhonchi, or wheezes.   Heart:  Regular rate and rhythm.   Abdomen:  Soft, nontender.  Positive bowel sounds.  No organomegaly or masses palpated.   Musculoskeletal:  No focal spinal tenderness to palpation.  Extremities:  Benign.  No peripheral edema or cyanosis.   Skin:  Benign.   Neuro:  Nonfocal, alert and oriented x 3.   Lab Results: Lab Results  Component Value Date   WBC 7.5 12/16/2012   HGB 12.6 12/16/2012   HCT 37.3 12/16/2012   MCV 81.4 12/16/2012   PLT 199 12/16/2012   NEUTROABS 4.9 12/16/2012     Chemistry      Component Value Date/Time   NA 139 07/15/2012 1053   K 3.8 07/15/2012 1053   CL 106 07/15/2012 1053   CO2 27 07/15/2012 1053   BUN 8 07/15/2012 1053   CREATININE 0.67 07/15/2012 1053      Component Value Date/Time   CALCIUM 9.1 07/15/2012 1053   ALKPHOS 45 07/15/2012 1053   AST 86* 07/15/2012 1053   ALT 42* 07/15/2012 1053   BILITOT 0.9 07/15/2012 1053      No results found for this basename: LABCA2     Assessment:  Sarinah is a 30 year old [place] 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 Recent course of slow prednisone taper for relapsed ITP, off steroids since 05/15/12, normalization of platelet count.  2. Iron deficiency anemia secondary to menorrhagia s/p IV iron on 02/29/12, normalization of Hgb.    Plan:  Naveh will remain on observation alone.she  is doing well, and will be seen in 6 months. As she is fairly intuitive, she knows she will call if she notices any bleding or bruising.  Nethra Mehlberg, md 07/15/12

## 2012-12-20 ENCOUNTER — Telehealth: Payer: Self-pay | Admitting: *Deleted

## 2012-12-20 NOTE — Telephone Encounter (Signed)
former patient of dr.rubin re-establishing with dr.ha / kristen mailed out patients six month appointment

## 2013-01-31 ENCOUNTER — Telehealth: Payer: Self-pay | Admitting: Oncology

## 2013-01-31 ENCOUNTER — Encounter: Payer: Self-pay | Admitting: Oncology

## 2013-01-31 NOTE — Telephone Encounter (Signed)
Former [facility] pt reassigned to [facility]. S/w pt today re new provider/date/time. Letter mailed.

## 2013-06-08 ENCOUNTER — Other Ambulatory Visit: Payer: Self-pay | Admitting: Oncology

## 2013-06-08 ENCOUNTER — Ambulatory Visit (HOSPITAL_BASED_OUTPATIENT_CLINIC_OR_DEPARTMENT_OTHER): Payer: BC Managed Care – PPO | Admitting: Lab

## 2013-06-08 ENCOUNTER — Emergency Department (HOSPITAL_COMMUNITY): Payer: BC Managed Care – PPO

## 2013-06-08 ENCOUNTER — Inpatient Hospital Stay (HOSPITAL_COMMUNITY)
Admission: EM | Admit: 2013-06-08 | Discharge: 2013-06-12 | DRG: 397 | Disposition: A | Discharge status: Home / Self Care | Payer: BC Managed Care – PPO | Attending: Internal Medicine | Admitting: Internal Medicine

## 2013-06-08 ENCOUNTER — Telehealth: Payer: Self-pay | Admitting: *Deleted

## 2013-06-08 ENCOUNTER — Encounter (HOSPITAL_COMMUNITY): Payer: Self-pay | Admitting: Emergency Medicine

## 2013-06-08 DIAGNOSIS — R0789 Other chest pain: Secondary | ICD-10-CM | POA: Diagnosis present

## 2013-06-08 DIAGNOSIS — D693 Immune thrombocytopenic purpura: Secondary | ICD-10-CM

## 2013-06-08 DIAGNOSIS — R739 Hyperglycemia, unspecified: Secondary | ICD-10-CM

## 2013-06-08 DIAGNOSIS — D509 Iron deficiency anemia, unspecified: Secondary | ICD-10-CM

## 2013-06-08 DIAGNOSIS — R079 Chest pain, unspecified: Secondary | ICD-10-CM

## 2013-06-08 DIAGNOSIS — N924 Excessive bleeding in the premenopausal period: Secondary | ICD-10-CM | POA: Diagnosis present

## 2013-06-08 DIAGNOSIS — R9431 Abnormal electrocardiogram [ECG] [EKG]: Secondary | ICD-10-CM

## 2013-06-08 DIAGNOSIS — D696 Thrombocytopenia, unspecified: Secondary | ICD-10-CM

## 2013-06-08 DIAGNOSIS — D72829 Elevated white blood cell count, unspecified: Secondary | ICD-10-CM | POA: Diagnosis present

## 2013-06-08 DIAGNOSIS — D219 Benign neoplasm of connective and other soft tissue, unspecified: Secondary | ICD-10-CM

## 2013-06-08 DIAGNOSIS — T380X5A Adverse effect of glucocorticoids and synthetic analogues, initial encounter: Secondary | ICD-10-CM | POA: Diagnosis present

## 2013-06-08 DIAGNOSIS — Z975 Presence of (intrauterine) contraceptive device: Secondary | ICD-10-CM

## 2013-06-08 DIAGNOSIS — D500 Iron deficiency anemia secondary to blood loss (chronic): Secondary | ICD-10-CM | POA: Diagnosis present

## 2013-06-08 DIAGNOSIS — E871 Hypo-osmolality and hyponatremia: Secondary | ICD-10-CM

## 2013-06-08 DIAGNOSIS — R7309 Other abnormal glucose: Secondary | ICD-10-CM | POA: Diagnosis present

## 2013-06-08 DIAGNOSIS — I309 Acute pericarditis, unspecified: Secondary | ICD-10-CM

## 2013-06-08 LAB — CBC WITH DIFFERENTIAL/PLATELET
Basophils Absolute: 0 10*3/uL (ref 0.0–0.1)
Basophils Relative: 0 % (ref 0–1)
Eosinophils Relative: 2 % (ref 0–5)
HCT: 36.3 % (ref 34.8–46.6)
HCT: 36.2 % (ref 36.0–46.0)
HGB: 12.5 g/dL (ref 11.6–15.9)
Hemoglobin: 12.7 g/dL (ref 12.0–15.0)
Lymphs Abs: 1.5 10*3/uL (ref 0.7–4.0)
MCH: 27.8 pg (ref 25.1–34.0)
MCH: 28 pg (ref 26.0–34.0)
MCV: 79.9 fL (ref 78.0–100.0)
MONO#: 0.6 10*3/uL (ref 0.1–0.9)
Monocytes Absolute: 0.7 10*3/uL (ref 0.1–1.0)
NEUT%: 74.8 % (ref 38.4–76.8)
RBC: 4.53 MIL/uL (ref 3.87–5.11)
WBC: 8.7 10*3/uL (ref 3.9–10.3)
lymph#: 1.5 10*3/uL (ref 0.9–3.3)

## 2013-06-08 LAB — COMPREHENSIVE METABOLIC PANEL
ALT: 35 U/L (ref 0–35)
CO2: 26 mEq/L (ref 19–32)
Calcium: 9.4 mg/dL (ref 8.4–10.5)
Creatinine, Ser: 0.68 mg/dL (ref 0.50–1.10)
GFR calc Af Amer: >90 mL/min (ref >90)
GFR calc non Af Amer: >90 mL/min (ref >90)
Glucose, Bld: 90 mg/dL (ref 70–99)
Sodium: 136 mEq/L (ref 135–145)
Total Bilirubin: 0.9 mg/dL (ref 0.3–1.2)

## 2013-06-08 LAB — LACTATE DEHYDROGENASE: LDH: 317 U/L — ABNORMAL HIGH (ref 94–250)

## 2013-06-08 LAB — DIC (DISSEMINATED INTRAVASCULAR COAGULATION)PANEL
D-Dimer, Quant: 0.27 ug/mL-FEU (ref 0.00–0.48)
INR: 1.22 (ref 0.00–1.49)
Prothrombin Time: 15.2 seconds (ref 11.6–15.2)

## 2013-06-08 LAB — MRSA PCR SCREENING: MRSA by PCR: NEGATIVE

## 2013-06-08 LAB — POCT PREGNANCY, URINE: Preg Test, Ur: NEGATIVE

## 2013-06-08 MED ORDER — SODIUM CHLORIDE 0.9 % IJ SOLN
3.0000 mL | Freq: Two times a day (BID) | INTRAMUSCULAR | Status: DC
  Administered 2013-06-09 – 2013-06-11 (×4): 3 mL via INTRAVENOUS

## 2013-06-08 MED ORDER — HYDROCODONE-ACETAMINOPHEN 5-325 MG PO TABS: 1.0000 | ORAL_TABLET | ORAL | Status: DC | PRN

## 2013-06-08 MED ORDER — ALBUTEROL SULFATE (5 MG/ML) 0.5% IN NEBU: 2.5000 mg | INHALATION_SOLUTION | RESPIRATORY_TRACT | Status: DC | PRN

## 2013-06-08 MED ORDER — DEXAMETHASONE SODIUM PHOSPHATE 4 MG/ML IJ SOLN
10.0000 mg | INTRAMUSCULAR | Status: DC
  Administered 2013-06-08: 10 mg via INTRAVENOUS
  Filled 2013-06-08: qty 3
  Filled 2013-06-08: qty 2.5

## 2013-06-08 MED ORDER — ONDANSETRON HCL 4 MG PO TABS: 4.0000 mg | ORAL_TABLET | Freq: Four times a day (QID) | ORAL | Status: DC | PRN

## 2013-06-08 MED ORDER — GUAIFENESIN-DM 100-10 MG/5ML PO SYRP: 5.0000 mL | ORAL_SOLUTION | ORAL | Status: DC | PRN

## 2013-06-08 MED ORDER — ONDANSETRON HCL 4 MG/2ML IJ SOLN: 4.0000 mg | Freq: Four times a day (QID) | INTRAMUSCULAR | Status: DC | PRN

## 2013-06-08 MED ORDER — POLYETHYLENE GLYCOL 3350 17 G PO PACK
17.0000 g | PACK | Freq: Every day | ORAL | Status: DC | PRN
  Filled 2013-06-08: qty 1

## 2013-06-08 MED ORDER — IMMUNE GLOBULIN (HUMAN) 10 GM/100ML IV SOLN
1.0000 g/kg | INTRAVENOUS | Status: DC
  Administered 2013-06-08 – 2013-06-09 (×2): 115 g via INTRAVENOUS
  Filled 2013-06-08 (×3): qty 1150

## 2013-06-08 MED ORDER — IMMUNE GLOBULIN (HUMAN) 5 GM/100ML IV SOLN: 1.0000 g/kg | Freq: Every day | INTRAVENOUS | Status: DC

## 2013-06-08 NOTE — Telephone Encounter (Signed)
Instructed pt to come in for CBC and to wait for results.  She verbalized understanding and will be here at 10 am.  Called lab to add to walk in schedule.

## 2013-06-08 NOTE — ED Notes (Signed)
Aggie Cosier from lab called with critical Platelets <5.  ILynelle Doctor EDP and Ajsa RN notififed

## 2013-06-08 NOTE — ED Notes (Signed)
[id number]<AV> Expected date:<BR> Expected time:<BR> Means of arrival:<BR> Comments:<BR> Hold ca pt of platlets of 5

## 2013-06-08 NOTE — H&P (Signed)
[facility]                                                                                    Patient Demographics  Bailey Sheppard, is a 32 y.o. female  CSN: [id number]  MRN: [id number]  DOB - [birth year]  Admit Date - 06/08/2013  Outpatient Primary MD for the patient is Hal Morales, MD   With History of -  Past Medical History  Diagnosis Date  . ITP (idiopathic thrombocytopenic purpura)   . ITP (idiopathic thrombocytopenic purpura)       Past Surgical History  Procedure Laterality Date  . Umbilical hernia repair      in for   Chief Complaint  Patient presents with  . Abnormal Lab     HPI  Bailey Sheppard  is a 32 y.o. female, history of ITP who was on prolonged course of steroids for a long time and finished that a few months ago, iron deficiency anemia, uterine fibroid, as intrauterine device implanted, presents to the hospital with few day history of bruising in her legs which has been spontaneous, she has also experienced some gum bleed after brushing her teeth, she denies any chest or abdominal pain, no blood in stool or urine, no dark colored stools, mild generalized headache which has not resolved, no shortness of breath. No fever or chills. She called Dr. Gita Kudo office and she was asked to get a CBC checked, CBC showed a platelet count of less than 5000 and I was requested to admit the patient with severe thrombocytopenia likely reflective of her ITP.    Review of Systems    In addition to the HPI above,  No Fever-chills, No Headache, No changes with Vision or hearing, No problems swallowing food or Liquids, No Chest pain, Cough or Shortness of Breath, No Abdominal pain, No Nausea or Vommitting, Bowel movements are regular, No Blood in stool or Urine, No dysuria, No new skin rashes , for multiple bruises and Gum bleed during brushing her teeth No new joints pains-aches,  No new weakness, tingling, numbness in any extremity, No recent weight  gain or loss, No polyuria, polydypsia or polyphagia, No significant Mental Stressors.  A full 10 point Review of Systems was done, except as stated above, all other Review of Systems were negative.   Social History History  Substance Use Topics  . Smoking status: Never Smoker   . Smokeless tobacco: Never Used  . Alcohol Use: Yes     Comment: occasionally      Family History Family History  Problem Relation Age of Onset  . Hypertension Mother   . Diabetes Father       Prior to Admission medications   Medication Sig Start Date End Date Taking? Authorizing Provider  acetaminophen (TYLENOL) 500 MG tablet Take 500 mg by mouth every 6 (six) hours as needed for pain.   Yes Historical Provider, MD    No Known Allergies  Physical Exam  Vitals  Blood pressure 126/78, pulse 77, temperature 99 F (37.2 C), temperature source Oral, resp. rate 20, SpO2 99.00%.   1. General pleasant young African American femalein bed in NAD,  2. Normal affect and insight, Not Suicidal or Homicidal, Awake Alert, Oriented X 3.  3. No F.N deficits, ALL C.Nerves Intact, Strength 5/5 all 4 extremities, Sensation intact all 4 extremities, Plantars down going.  4. Ears and Eyes appear Normal, Conjunctivae clear, PERRLA. Moist Oral Mucosa.  5. Supple Neck, No JVD, No cervical lymphadenopathy appriciated, No Carotid Bruits.  6. Symmetrical Chest wall movement, Good air movement bilaterally, CTAB.  7. RRR, No Gallops, Rubs or Murmurs, No Parasternal Heave.  8. Positive Bowel Sounds, Abdomen Soft, Non tender, No organomegaly appriciated,No rebound -guarding or rigidity.  9.  No Cyanosis, Normal Skin Turgor, No Skin Rash , multiple bruises mostly in the legs  10. Good muscle tone,  joints appear normal , no effusions, Normal ROM.  11. No Palpable Lymph Nodes in Neck or Axillae     Data Review  CBC  Recent Labs Lab 06/08/13 1031 06/08/13 1315  WBC 8.7 7.7  HGB 12.5 12.7  HCT 36.3  36.2  PLT 5* <5*  MCV 80.7 79.9  MCH 27.8 28.0  MCHC 34.4 35.1  RDW 14.7* 14.4  LYMPHSABS 1.5 1.5  MONOABS 0.6 0.7  EOSABS 0.2 0.2  BASOSABS 0.0 0.0   ------------------------------------------------------------------------------------------------------------------  Chemistries   Recent Labs Lab 06/08/13 1315  NA 136  K 3.6  CL 101  CO2 26  GLUCOSE 90  BUN 5*  CREATININE 0.68  CALCIUM 9.4  AST 45*  ALT 35  ALKPHOS 67  BILITOT 0.9   ------------------------------------------------------------------------------------------------------------------ CrCl is unknown because both a height and weight (above a minimum accepted value) are required for this calculation. ------------------------------------------------------------------------------------------------------------------ No results found for this basename: TSH, T4TOTAL, FREET3, T3FREE, THYROIDAB,  in the last 72 hours   Coagulation profile No results found for this basename: INR, PROTIME,  in the last 168 hours ------------------------------------------------------------------------------------------------------------------- No results found for this basename: DDIMER,  in the last 72 hours -------------------------------------------------------------------------------------------------------------------  Cardiac Enzymes No results found for this basename: CK, CKMB, TROPONINI, MYOGLOBIN,  in the last 168 hours ------------------------------------------------------------------------------------------------------------------ No components found with this basename: POCBNP,    ---------------------------------------------------------------------------------------------------------------  Urinalysis    Component Value Date/Time   LABSPEC 1.015 01/17/2011 1121   PHURINE 6.5 01/17/2011 1121   HGBUR LARGE* 01/17/2011 1121   BILIRUBINUR NEGATIVE 01/17/2011 1121   KETONESUR NEGATIVE 01/17/2011 1121   PROTEINUR NEGATIVE  01/17/2011 1121   UROBILINOGEN 0.2 01/17/2011 1121   NITRITE NEGATIVE 01/17/2011 1121   LEUKOCYTESUR LARGE Biochemical Testing Only. Please order routine urinalysis from main lab if confirmatory testing is needed.* 01/17/2011 1121    ----------------------------------------------------------------------------------------------------------------  Imaging results:   Ct Head Wo Contrast  06/08/2013   *RADIOLOGY REPORT*  Clinical Data: Right frontal headache.  Dizziness. Thrombocytopenia.  CT HEAD WITHOUT CONTRAST  Technique:  Contiguous axial images were obtained from the base of the skull through the vertex without contrast.  Comparison: Head CT 02/20/2012  Findings: The brain has a normal appearance without evidence of malformation, atrophy, old or acute infarction, mass lesion, hemorrhage, hydrocephalus or extra-axial collection.  The calvarium is unremarkable.  Sinuses, middle ears and mastoids are clear.  IMPRESSION: Normal head CT   Original Report Authenticated By: Paulina Fusi, M.D.      baseline urine pregnancy test will be obtained     Assessment & Plan    1. Severe thrombocytopenia in a patient with history of ITP who recently finished her prolonged steroid course. - She will be admitted to a step down bed, she will receive IV steroids, she would  also get IVIG, hematologist Dr. Darnelle Catalan has been consulted and will see the patient shortly, for now he is advised to hold off on platelet transfusions. Her CBC will be monitored closely.     2. History of iron deficiency anemia and uterine fibroids. No acute issues outpatient monitoring by PCP.     DVT Prophylaxis platelet count is too low, and she is spontaneously bruising.   AM Labs Ordered, also please review Full Orders  Family Communication: Admission, patients condition and plan of care including tests being ordered have been discussed with the patient and family who indicate understanding and agree with the plan and Code  Status.  Code Statusfull  Likely DC to  home  Time spent in minutes : 35  Condition Bailey Sheppard K M.D on 06/08/2013 at 3:45 PM  Between 7am to 7pm - Pager - [phone]  After 7pm go to [email or web address] - password [email or web address]  And look for the night coverage person covering me after hours  [facility] Office  [phone]

## 2013-06-08 NOTE — Telephone Encounter (Signed)
OK to come in for CBC today. Cameo; please put in order.   Tiffany, he should have been assigned to another provider since I have not been taking transferred patient for a while.   Thanks.

## 2013-06-08 NOTE — Telephone Encounter (Signed)
Former pt of Dr. Renelda Loma w/ hx of ITP,  Reassigned to Dr. Gaylyn Rong-  Left VM reporting new bruising in legs/ feet and arms over this past weekend.  She last saw Dr. Donnie Coffin in Dec. 2013 and due to see Dr. Gaylyn Rong at end of this month.  Asks if she can come in to have CBC checked today for the bruising?

## 2013-06-08 NOTE — ED Notes (Signed)
RN on 2W is checking with admitting physician to get pt floor switched. RN stated she will call back immediately.

## 2013-06-08 NOTE — Consult Note (Signed)
[facility]  [phone]   ONCOLOGY  HOSPITAL CONSULTATION NOTE  Dustyn Armbrister                                MR#: [id number]  DOB: 1981-12-25                       CSN#: [id number]  Referring MD: [facility]  Teaching Service  Dr.   Valera Castle MD: Dr. Trenda Moots  Reason for Consult: Thrombocytopenia   Patient Identification :Bailey Sheppard is a 32 y.o.  [place] woman former patient of Dr. Donnie Coffin with a history of:   1. ITP originally diagnosed in 2007 while she was residing in [place], treated with slow prednisone taper (100 mg/d then taper for a 3 month period) for relapsed ITP, off steroids since 05/15/12. Last flare was in 02/2012 when her platelet count dropped to 14,000.  She was last seen at the office of the Cancer Center on 12/16/2012, at which time she was felt to be clinically stable. Last platelet count  Prior to this admission was 199k.  2. Iron deficiency anemia secondary to menorrhagia s/p IV iron on 02/29/12, normalization of Hgb, latest value as of 12/16/12 of 12.6   HPI: Ms. Cartelli called the office of the Cancer Center on 06/08/2013 reporting new areas of   bruising in all  Extremities, especially in thighs, gum bleeding when brushing her teeth, and blood clots from the left nostril.  CBC at the Center revealed a platelet count of 5000, for which further evaluation at the ED was recommended. Patient was increasingly tired over the last week. No recent infections. No tick or other insect bites. No recent traveling. No sick contacts. Smear has been ordered for review.  Denies any  ASA or recent   NSAIDs. Denies risk factors for HIV or hepatitis.  No blood in urine or in stool. No PT/ INR available We were kindly asked to see the patient with recommendations   PMH:  Past Medical History  Diagnosis Date  . ITP (idiopathic thrombocytopenic purpura)   . History of Fibroids     Surgeries:  Past Surgical History  Procedure  Laterality Date  . Umbilical hernia repair     IUD implantation 04/2012  Allergies: No Known Allergies  Medications:    prior to admission:  (Not in a hospital admission)  . dexamethasone  10 mg Intravenous Q24H    PRN:  ROS: See HPI for significant positives.She had one episode of headache on 6/8, quickly resolved with tylenol. No vision changes. In addition, she has noticed abnormal vaginal bleeding (LMP was several months ago. No mentals status changes or seizures. The rest of the review of systems is negative.  Family History:    Family History  Problem Relation Age of Onset  . Hypertension Mother   . Diabetes Father     No family history of bleeding disorders. She has 2 cousins with Lupus.   GYN: menarche age 61, she is GX P2. Has an IUD in place.  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  The patient is in a stable relationship (12 years) with Erie Noe, who works as a Production designer, theatre/television/film for [employer]. They have 2 sons, aged 80 and 3   reports that she has never smoked. She has never used smokeless tobacco. She reports that  drinks alcohol socially. She reports  that she does not use illicit drugs .Marland Kitchen    Health Maintenance: Last PAP exam  03/2012.  Mammogram 03/2012.   Physical Exam    Filed Vitals:   06/08/13 1223  BP: 128/82  Pulse: 81  Temp: 98.4 F (36.9 C)  Resp: 16     There were no vitals filed for this visit.  General:  8 -year-old  in no acute distress A. and O. x3  well-developed . HEENT: Normocephalic, atraumatic, PERRLA. Oral cavity without thrush. No gingival bleeding.In the posterior palate, there is an area of bruising but no open lesion visible Neck supple. no thyromegaly, no cervical or supraclavicular adenopathy  Lungs clear bilaterally . No wheezing, rhonchi or rales. No axillary masses. Breasts: not examined. Cardiac regular rate and rhythm,no murmur , rubs or gallops Abdomen soft nontender , bowel sounds x4. No HSM. No  masses palpable.  GU/rectal: deferred. Extremities no clubbing cyanosis or edema.Large area of  Bruising in both thighs, small petechial rash on the ventral portion of her arms, some smaller areas of bruising in lower legs.  Musculoskeletal: no spinal tenderness.  Neuro: Non Focal  Labs:     Recent Labs Lab 06/08/13 1031 06/08/13 1315  WBC 8.7 7.7  HGB 12.5 12.7  HCT 36.3 36.2  PLT 5* <5*  MCV 80.7 79.9  MCH 27.8 28.0  MCHC 34.4 35.1  RDW 14.7* 14.4  LYMPHSABS 1.5 1.5  MONOABS 0.6 0.7  EOSABS 0.2 0.2  BASOSABS 0.0 0.0         Recent Labs Lab 06/08/13 1315  NA 136  K 3.6  CL 101  CO2 26  GLUCOSE 90  BUN 5*  CREATININE 0.68  CALCIUM 9.4  AST 45*  ALT 35  ALKPHOS 67  BILITOT 0.9        Component Value Date/Time   BILITOT 0.9 06/08/2013 1315     No results found for this basename: INR, PROTIME,  in the last 168 hours  No results found for this basename: DDIMER,  in the last 72 hours   Anemia panel:  No results found for this basename: VITAMINB12, FOLATE, FERRITIN, TIBC, IRON, RETICCTPCT,  in the last 72 hours  Urinalysis    Component Value Date/Time   LABSPEC 1.015 01/17/2011 1121   PHURINE 6.5 01/17/2011 1121   HGBUR LARGE* 01/17/2011 1121   BILIRUBINUR NEGATIVE 01/17/2011 1121   KETONESUR NEGATIVE 01/17/2011 1121   PROTEINUR NEGATIVE 01/17/2011 1121   UROBILINOGEN 0.2 01/17/2011 1121   NITRITE NEGATIVE 01/17/2011 1121   LEUKOCYTESUR LARGE Biochemical Testing Only. Please order routine urinalysis from main lab if confirmatory testing is needed.* 01/17/2011 1121    Drugs of Abuse  No results found for this basename: labopia, cocainscrnur, labbenz, amphetmu, thcu, labbarb     Imaging Studies:  Ct Head Wo Contrast  06/08/2013   *RADIOLOGY REPORT*  Clinical Data: Right frontal headache.  Dizziness. Thrombocytopenia.  CT HEAD WITHOUT CONTRAST  Technique:  Contiguous axial images were obtained from the base of the skull through the vertex without  contrast.  Comparison: Head CT 02/20/2012  Findings: The brain has a normal appearance without evidence of malformation, atrophy, old or acute infarction, mass lesion, hemorrhage, hydrocephalus or extra-axial collection.  The calvarium is unremarkable.  Sinuses, middle ears and mastoids are clear.  IMPRESSION: Normal head CT   Original Report Authenticated By: Paulina Fusi, M.D.      A/P: 32 y.o. female   With a history of ITP dating back to 2007, treated with  steroids, admitted for management of severely low platelet count, today at 5000.  Smear has been ordered for review. DIC panel recommended. First Decadron Dose 10 mg IV given at 15:30 hrs Dr. Darnelle Catalan  is to see the patient following this consult with recommendations regarding diagnosis and  further workup studies  An addendum to this note is to be written.    Thank you for the referral.  Marcos Eke, PA-C 06/08/2013 3:35 PM  ADDENDUM:  32 year-old [place] woman with a history of immune thrombocytopenic purpura initially diagnosed in 2007, treated then with steroids, relapsed in 2013, again treated with steroids, and no admitted with mucosal bleeding and a platelet count of <5K.   She has a normal creatinine and no fever or other suggestion of TTP or DIC, but just for completeness we will send a DIC panel. She has been started on steroids and I have added IVIG to start tonight. As soon as the platelet count is >20K patient can be discharged and I will follow closely as outpatient. We will start her on outpatient rituximab as we slowly taper her dexamethasone.  I have discussed this with Ms. Ebbie Ridge who has a good understanding of her situation and plan and is in agreement. Will follow with you while in house   I personally saw this patient and performed a substantive portion of this encounter with the listed APP documented above.Marland Kitchen  Lowella Dell, MD

## 2013-06-08 NOTE — Telephone Encounter (Signed)
Reviewed lab results w/ pt,  Informed of low Platelet count of 5.   Dr. Gaylyn Rong reviewed labs,  His opinion is pt needs to be admitted to hospital.  Instructed pt to go to ED for eval/treat and possible admission.  She verbalized understanding.  Called report to Triage RN,  Shawna Orleans.

## 2013-06-08 NOTE — ED Notes (Signed)
Pt states she began easily  bruising and knew her platelet count was low. Went to cancer center to have labs drawn and platelet count came back at 5. Pt is alert and oriented x4 and also states she began to get really cold last night which was abnormal for her. Pt has history of ITP dx in 2007.

## 2013-06-08 NOTE — ED Provider Notes (Signed)
History     CSN: [id number]  Arrival date & time 06/08/13  1218   First MD Initiated Contact with Patient 06/08/13 1342      Chief Complaint  Patient presents with  . Abnormal Lab    (Consider location/radiation/quality/duration/timing/severity/associated sxs/prior treatment) HPI She reports she was diagnosed with ITP in 2007. She states she had her last flareup in February 2013 when her platelet count dropped to 14,000 and she was on prednisone for 3 months. She states last week she started feeling tired in 2 days ago she noticed some bruising on her left foot without any injury or even minor trauma. The next day she started noticing small petechiae on her arms which she states she's never had before. She states she's been noticing when she brushes her teeth she has blood and if  she blows her nose she sees blood clots. She denies any rectal bleeding or vaginal bleeding currently although 2 weeks ago she did have some vaginal bleeding which is unusual. She states her last period was several months ago and she has an IUD in place. She also reports she's been getting a headache described as a pressure that seems to start in her neck and over the top of her head over the past several days. She took Tylenol for headache last night and it resolved. She denies nausea, vomiting but she does have dyspnea on exertion and has PND. She was seen this morning in the cancer clinic and was called and told her platelet count was 5000 to come to the emergency department. She reports she has not had a splenectomy.  PCP none Oncologist Dr Vickki Hearing  Past Medical History  Diagnosis Date  . ITP (idiopathic thrombocytopenic purpura)   . ITP (idiopathic thrombocytopenic purpura)     Past Surgical History  Procedure Laterality Date  . Umbilical hernia repair      Family History  Problem Relation Age of Onset  . Hypertension Mother   . Diabetes Father     History  Substance Use Topics  . Smoking  status: Never Smoker   . Smokeless tobacco: Never Used  . Alcohol Use: Yes     Comment: occasionally  employed  OB History   Grav Para Term Preterm Abortions TAB SAB Ect Mult Living                  Review of Systems  All other systems reviewed and are negative.    Allergies  Review of patient's allergies indicates no known allergies.  Home Medications   Current Outpatient Rx  Name  Route  Sig  Dispense  Refill  . acetaminophen (TYLENOL) 500 MG tablet   Oral   Take 500 mg by mouth every 6 (six) hours as needed for pain.           BP 128/82  Pulse 81  Temp(Src) 98.4 F (36.9 C) (Oral)  Resp 16  SpO2 98%  Vital signs normal    Physical Exam  Nursing note and vitals reviewed. Constitutional: She is oriented to person, place, and time. She appears well-developed and well-nourished.  Non-toxic appearance. She does not appear ill. No distress.  HENT:  Head: Normocephalic and atraumatic.  Right Ear: External ear normal.  Left Ear: External ear normal.  Nose: Nose normal. No mucosal edema or rhinorrhea.  Mouth/Throat: Oropharynx is clear and moist and mucous membranes are normal. No dental abscesses or edematous.  Eyes: Conjunctivae and EOM are normal. Pupils are equal, round, and  reactive to light.  Neck: Normal range of motion and full passive range of motion without pain. Neck supple.  Cardiovascular: Normal rate, regular rhythm and normal heart sounds.  Exam reveals no gallop and no friction rub.   No murmur heard. Pulmonary/Chest: Effort normal and breath sounds normal. No respiratory distress. She has no wheezes. She has no rhonchi. She has no rales. She exhibits no tenderness and no crepitus.  Abdominal: Soft. Normal appearance and bowel sounds are normal. She exhibits no distension. There is no tenderness. There is no rebound and no guarding.  Musculoskeletal: Normal range of motion. She exhibits no edema and no tenderness.  Moves all extremities well.    Neurological: She is alert and oriented to person, place, and time. She has normal strength. No cranial nerve deficit.  Skin: Skin is warm, dry and intact. No rash noted. No erythema. No pallor.  Patient noted to have scattered petechiae mainly on her upper extremities, she is noted to have some small hemorrhagic lesions about 2-3 mm in size on her left inner cheek, she also has a large deep purple bruise on the outer aspect of each thigh for minor brushing against objects yesterday, she has a large brown swollen area on the dorsum of her left foot which she states she originally got 2 days ago without trauma, she is noted to have small other bruising on her lower extremities  Psychiatric: She has a normal mood and affect. Her speech is normal and behavior is normal. Her mood appears not anxious.    ED Course  Procedures (including critical care time)  15:11 Dr Thedore Mins, admit to step down team 5, asks me to call hematology, he already spoke to Dr Gaylyn Rong who states he is not going to be her oncologist.  15:20 Dr Thedore Mins called back, states he has talked to Dr Arlice Colt.    Results for orders placed during the hospital encounter of 06/08/13  CBC WITH DIFFERENTIAL      Result Value Range   WBC 7.7  4.0 - 10.5 K/uL   RBC 4.53  3.87 - 5.11 MIL/uL   Hemoglobin 12.7  12.0 - 15.0 g/dL   HCT 16.1  09.6 - 04.5 %   MCV 79.9  78.0 - 100.0 fL   MCH 28.0  26.0 - 34.0 pg   MCHC 35.1  30.0 - 36.0 g/dL   RDW 40.9  81.1 - 91.4 %   Platelets <5 (*) 150 - 400 K/uL   Neutrophils Relative % 69  43 - 77 %   Lymphocytes Relative 20  12 - 46 %   Monocytes Relative 9  3 - 12 %   Eosinophils Relative 2  0 - 5 %   Basophils Relative 0  0 - 1 %   Neutro Abs 5.3  1.7 - 7.7 K/uL   Lymphs Abs 1.5  0.7 - 4.0 K/uL   Monocytes Absolute 0.7  0.1 - 1.0 K/uL   Eosinophils Absolute 0.2  0.0 - 0.7 K/uL   Basophils Absolute 0.0  0.0 - 0.1 K/uL   Smear Review PLATELET COUNT CONFIRMED BY SMEAR    COMPREHENSIVE METABOLIC PANEL       Result Value Range   Sodium 136  135 - 145 mEq/L   Potassium 3.6  3.5 - 5.1 mEq/L   Chloride 101  96 - 112 mEq/L   CO2 26  19 - 32 mEq/L   Glucose, Bld 90  70 - 99 mg/dL   BUN 5 (*)  6 - 23 mg/dL   Creatinine, Ser 1.47  0.50 - 1.10 mg/dL   Calcium 9.4  8.4 - 82.9 mg/dL   Total Protein 7.2  6.0 - 8.3 g/dL   Albumin 3.9  3.5 - 5.2 g/dL   AST 45 (*) 0 - 37 U/L   ALT 35  0 - 35 U/L   Alkaline Phosphatase 67  39 - 117 U/L   Total Bilirubin 0.9  0.3 - 1.2 mg/dL   GFR calc non Af Amer >90  >90 mL/min   GFR calc Af Amer >90  >90 mL/min   Laboratory interpretation all normal except severe thrombocytopenia    Ct Head Wo Contrast  06/08/2013   *RADIOLOGY REPORT*  Clinical Data: Right frontal headache.  Dizziness. Thrombocytopenia.  CT HEAD WITHOUT CONTRAST  Technique:  Contiguous axial images were obtained from the base of the skull through the vertex without contrast.  Comparison: Head CT 02/20/2012  Findings: The brain has a normal appearance without evidence of malformation, atrophy, old or acute infarction, mass lesion, hemorrhage, hydrocephalus or extra-axial collection.  The calvarium is unremarkable.  Sinuses, middle ears and mastoids are clear.  IMPRESSION: Normal head CT   Original Report Authenticated By: Paulina Fusi, M.D.     1. Thrombocytopenia     Plan admission   Devoria Albe, MD, FACEP   MDM          Ward Givens, MD 06/08/13 [phone]

## 2013-06-08 NOTE — ED Notes (Signed)
Admitting MD at bedside.

## 2013-06-09 ENCOUNTER — Other Ambulatory Visit: Payer: Self-pay | Admitting: Oncology

## 2013-06-09 LAB — GLUCOSE, CAPILLARY
Glucose-Capillary: 154 mg/dL — ABNORMAL HIGH (ref 70–99)
Glucose-Capillary: 129 mg/dL — ABNORMAL HIGH (ref 70–99)

## 2013-06-09 LAB — CBC
Hemoglobin: 11.9 g/dL — ABNORMAL LOW (ref 12.0–15.0)
MCH: 28.4 pg (ref 26.0–34.0)
Platelets: 13 10*3/uL — CL (ref 150–400)
RBC: 4.19 MIL/uL (ref 3.87–5.11)
RDW: 14.4 % (ref 11.5–15.5)
WBC: 8.3 10*3/uL (ref 4.0–10.5)

## 2013-06-09 LAB — BASIC METABOLIC PANEL
GFR calc Af Amer: >90 mL/min (ref >90)
GFR calc non Af Amer: >90 mL/min (ref >90)
Potassium: 3.8 mEq/L (ref 3.5–5.1)
Sodium: 129 mEq/L — ABNORMAL LOW (ref 135–145)

## 2013-06-09 LAB — DIC (DISSEMINATED INTRAVASCULAR COAGULATION)PANEL
Platelets: 13 10*3/uL — CL (ref 150–400)
Smear Review: NONE SEEN

## 2013-06-09 MED ORDER — ACETAMINOPHEN 325 MG PO TABS
325.0000 mg | ORAL_TABLET | Freq: Four times a day (QID) | ORAL | Status: DC | PRN
  Administered 2013-06-09: 650 mg via ORAL
  Filled 2013-06-09: qty 2

## 2013-06-09 MED ORDER — INSULIN ASPART 100 UNIT/ML ~~LOC~~ SOLN
0.0000 [IU] | Freq: Every day | SUBCUTANEOUS | Status: DC
Start: 2013-06-09 — End: 2013-06-12

## 2013-06-09 MED ORDER — POLYETHYLENE GLYCOL 3350 17 G PO PACK
17.0000 g | PACK | Freq: Every day | ORAL | Status: DC
  Administered 2013-06-09 – 2013-06-11 (×3): 17 g via ORAL
  Filled 2013-06-09 (×4): qty 1

## 2013-06-09 MED ORDER — INSULIN ASPART 100 UNIT/ML ~~LOC~~ SOLN
0.0000 [IU] | Freq: Three times a day (TID) | SUBCUTANEOUS | Status: DC
  Administered 2013-06-09: 1 [IU] via SUBCUTANEOUS
  Administered 2013-06-09 – 2013-06-10 (×2): 2 [IU] via SUBCUTANEOUS
  Administered 2013-06-10 – 2013-06-11 (×3): 1 [IU] via SUBCUTANEOUS

## 2013-06-09 MED ORDER — DEXAMETHASONE 4 MG PO TABS
8.0000 mg | ORAL_TABLET | Freq: Two times a day (BID) | ORAL | Status: DC
  Administered 2013-06-09 (×2): 8 mg via ORAL
  Filled 2013-06-09 (×4): qty 2

## 2013-06-09 MED ORDER — TRAMADOL HCL 50 MG PO TABS
50.0000 mg | ORAL_TABLET | Freq: Four times a day (QID) | ORAL | Status: DC | PRN
  Administered 2013-06-09 – 2013-06-11 (×4): 50 mg via ORAL
  Filled 2013-06-09 (×4): qty 1

## 2013-06-09 NOTE — Progress Notes (Signed)
Hyperglycemia   SSI ordered 

## 2013-06-09 NOTE — Progress Notes (Signed)
[facility]                                                                                Patient Demographics  Bailey Sheppard, is a 32 y.o. female, DOB - Jul 20, 1981, EXB:[id number], NUU:[id number]  Admit date - 06/08/2013  Admitting Physician Leroy Sea, MD  Outpatient Primary MD for the patient is Hal Morales, MD  LOS - 1   Chief Complaint  Patient presents with  . Abnormal Lab        Assessment & Plan    1. Severe thrombocytopenia in a patient with history of ITP who recently finished her prolonged steroid course. - continue monitoring in step down bed, IV steroids, and a new IVIG per hematologist Dr. Darnelle Catalan . Her CBC will be monitored closely, thrombocytopenia has slightly improved.   2. History of iron deficiency anemia and uterine fibroids. No acute issues outpatient monitoring by PCP.     Code Status: full  Family Communication: familly  Disposition Plan: Home   Procedures IV Ig   Consults  Haem-onc   DVT Prophylaxis   platelet count is too low, and she is spontaneously bruising.    Lab Results  Component Value Date   PLT 13* 06/09/2013   PLT 13* 06/09/2013    Medications  Scheduled Meds: . dexamethasone  10 mg Intravenous Q24H  . IMMUNE GLOBULIN 10% (HUMAN) IV - For Fluid Restriction Only  1 g/kg Intravenous Q24H  . insulin aspart  0-5 Units Subcutaneous QHS  . insulin aspart  0-9 Units Subcutaneous TID WC  . sodium chloride  3 mL Intravenous Q12H   Continuous Infusions:  PRN Meds:.albuterol, guaiFENesin-dextromethorphan, HYDROcodone-acetaminophen, ondansetron (ZOFRAN) IV, ondansetron, polyethylene glycol  Antibiotics    Anti-infectives   None       Time Spent in minutes   30   Susa Raring K M.D on 06/09/2013 at 8:27 AM  Between 7am to 7pm - Pager - [phone]  After 7pm go to [email or web address] - password [email or web address]  And look for the night coverage person covering for me after hours  [facility]  Group Office  [phone]    Subjective:   Bailey Sheppard today has, No headache, No chest pain, No abdominal pain - No Nausea, No new weakness tingling or numbness, No Cough - SOB.    Objective:   Filed Vitals:   06/09/13 0500 06/09/13 0600 06/09/13 0700 06/09/13 0800  BP:      Pulse: 70 59 70 56  Temp:    98.6 F (37 C)  TempSrc:    Oral  Resp: 31 30 29 24   Height:      Weight: 188.4 kg (263 lb 14.3 oz)     SpO2: 95% 96% 100% 99%    Wt Readings from Last 3 Encounters:  06/09/13 119.7 kg (263 lb 14.3 oz)  12/16/12 111.812 kg (246 lb 8 oz)  07/15/12 115.032 kg (253 lb 9.6 oz)     Intake/Output Summary (Last 24 hours) at 06/09/13 0827 Last data filed at 06/09/13 0044  Gross per 24 hour  Intake   1150 ml  Output      0 ml  Net   1150 ml  Exam Awake Alert, Oriented X 3, No new F.N deficits, Normal affect .AT,PERRAL Supple Neck,No JVD, No cervical lymphadenopathy appriciated.  Symmetrical Chest wall movement, Good air movement bilaterally, CTAB RRR,No Gallops,Rubs or new Murmurs, No Parasternal Heave +ve B.Sounds, Abd Soft, Non tender, No organomegaly appriciated, No rebound - guarding or rigidity. No Cyanosis, Clubbing or edema, No new Rash , stable bruising   Data Review   Micro Results Recent Results (from the past 240 hour(s))  MRSA PCR SCREENING     Status: None   Collection Time    06/08/13  4:43 PM      Result Value Range Status   MRSA by PCR NEGATIVE  NEGATIVE Final   Comment:            The GeneXpert MRSA Assay (FDA     approved for NASAL specimens     only), is one component of a     comprehensive MRSA colonization     surveillance program. It is not     intended to diagnose MRSA     infection nor to guide or     monitor treatment for     MRSA infections.    Radiology Reports Ct Head Wo Contrast  06/08/2013   *RADIOLOGY REPORT*  Clinical Data: Right frontal headache.  Dizziness. Thrombocytopenia.  CT HEAD WITHOUT CONTRAST  Technique:   Contiguous axial images were obtained from the base of the skull through the vertex without contrast.  Comparison: Head CT 02/20/2012  Findings: The brain has a normal appearance without evidence of malformation, atrophy, old or acute infarction, mass lesion, hemorrhage, hydrocephalus or extra-axial collection.  The calvarium is unremarkable.  Sinuses, middle ears and mastoids are clear.  IMPRESSION: Normal head CT   Original Report Authenticated By: Paulina Fusi, M.D.    CBC  Recent Labs Lab 06/08/13 1031 06/08/13 1315 06/08/13 1710 06/09/13 0330  WBC 8.7 7.7  --  8.3  HGB 12.5 12.7  --  11.9*  HCT 36.3 36.2  --  34.1*  PLT 5* <5* <5* 13*  13*  MCV 80.7 79.9  --  81.4  MCH 27.8 28.0  --  28.4  MCHC 34.4 35.1  --  34.9  RDW 14.7* 14.4  --  14.4  LYMPHSABS 1.5 1.5  --   --   MONOABS 0.6 0.7  --   --   EOSABS 0.2 0.2  --   --   BASOSABS 0.0 0.0  --   --     Chemistries   Recent Labs Lab 06/08/13 1315 06/09/13 0330  NA 136 129*  K 3.6 3.8  CL 101 99  CO2 26 22  GLUCOSE 90 242*  BUN 5* 11  CREATININE 0.68 0.73  CALCIUM 9.4 9.2  AST 45*  --   ALT 35  --   ALKPHOS 67  --   BILITOT 0.9  --    ------------------------------------------------------------------------------------------------------------------ estimated creatinine clearance is 144.5 ml/min (by C-G formula based on Cr of 0.73). ------------------------------------------------------------------------------------------------------------------ No results found for this basename: HGBA1C,  in the last 72 hours ------------------------------------------------------------------------------------------------------------------ No results found for this basename: CHOL, HDL, LDLCALC, TRIG, CHOLHDL, LDLDIRECT,  in the last 72 hours ------------------------------------------------------------------------------------------------------------------ No results found for this basename: TSH, T4TOTAL, FREET3, T3FREE, THYROIDAB,  in  the last 72 hours ------------------------------------------------------------------------------------------------------------------ No results found for this basename: VITAMINB12, FOLATE, FERRITIN, TIBC, IRON, RETICCTPCT,  in the last 72 hours  Coagulation profile  Recent Labs Lab 06/08/13 1710 06/09/13 0330  INR 1.22 1.17  Recent Labs  06/08/13 1710 06/09/13 0330  DDIMER 0.27 0.47    Cardiac Enzymes No results found for this basename: CK, CKMB, TROPONINI, MYOGLOBIN,  in the last 168 hours ------------------------------------------------------------------------------------------------------------------ No components found with this basename: POCBNP,

## 2013-06-09 NOTE — Progress Notes (Signed)
Bailey Sheppard   DOB:November 18, 1981   WU#:[id number]   GNF#:[id number]  Subjective: tolerated IVIg yesterday w/o event. Having a bit of a headache w steroids. No bleeding. Rest of ROS stable. No family in room   Objective: young African American woman examined in bed Filed Vitals:   06/09/13 1105  BP: 113/58  Pulse: 44  Temp:   Resp: 17    Body mass index is 37.86 kg/(m^2).  Intake/Output Summary (Last 24 hours) at 06/09/13 1333 Last data filed at 06/09/13 0900  Gross per 24 hour  Intake   1390 ml  Output      0 ml  Net   1390 ml     Sclerae unicteric  Oropharynx no thrush  No peripheral adenopathy  Lungs clear -- no rales or rhonchi  Heart regular rate and rhythm  Abdomen benign  MSK . no peripheral edema  Neuro nonfocal  Breast exam: deferred  CBG (last 3)   Recent Labs  06/09/13 0727 06/09/13 1130  GLUCAP 154* 117*     Labs:  Lab Results  Component Value Date   WBC 8.3 06/09/2013   HGB 11.9* 06/09/2013   HCT 34.1* 06/09/2013   MCV 81.4 06/09/2013   PLT 13* 06/09/2013   PLT 13* 06/09/2013   NEUTROABS 5.3 06/08/2013    @LASTCHEMISTRY @  Urine Studies No results found for this basename: UACOL, UAPR, USPG, UPH, UTP, UGL, UKET, UBIL, UHGB, UNIT, UROB, ULEU, UEPI, UWBC, URBC, UBAC, CAST, CRYS, UCOM, BILUA,  in the last 72 hours  Basic Metabolic Panel:  Recent Labs Lab 06/08/13 1315 06/09/13 0330  NA 136 129*  K 3.6 3.8  CL 101 99  CO2 26 22  GLUCOSE 90 242*  BUN 5* 11  CREATININE 0.68 0.73  CALCIUM 9.4 9.2   GFR Estimated Creatinine Clearance: 144.5 ml/min (by C-G formula based on Cr of 0.73). Liver Function Tests:  Recent Labs Lab 06/08/13 1315  AST 45*  ALT 35  ALKPHOS 67  BILITOT 0.9  PROT 7.2  ALBUMIN 3.9   No results found for this basename: LIPASE, AMYLASE,  in the last 168 hours No results found for this basename: AMMONIA,  in the last 168 hours Coagulation profile  Recent Labs Lab 06/08/13 1710 06/09/13 0330  INR 1.22 1.17     CBC:  Recent Labs Lab 06/08/13 1031 06/08/13 1315 06/08/13 1710 06/09/13 0330  WBC 8.7 7.7  --  8.3  NEUTROABS 6.5 5.3  --   --   HGB 12.5 12.7  --  11.9*  HCT 36.3 36.2  --  34.1*  MCV 80.7 79.9  --  81.4  PLT 5* <5* <5* 13*  13*   Cardiac Enzymes: No results found for this basename: CKTOTAL, CKMB, CKMBINDEX, TROPONINI,  in the last 168 hours BNP: No components found with this basename: POCBNP,  CBG:  Recent Labs Lab 06/09/13 0727 06/09/13 1130  GLUCAP 154* 117*   D-Dimer  Recent Labs  06/08/13 1710 06/09/13 0330  DDIMER 0.27 0.47   Hgb A1c No results found for this basename: HGBA1C,  in the last 72 hours Lipid Profile No results found for this basename: CHOL, HDL, LDLCALC, TRIG, CHOLHDL, LDLDIRECT,  in the last 72 hours Thyroid function studies No results found for this basename: TSH, T4TOTAL, FREET3, T3FREE, THYROIDAB,  in the last 72 hours Anemia work up No results found for this basename: VITAMINB12, FOLATE, FERRITIN, TIBC, IRON, RETICCTPCT,  in the last 72 hours Microbiology Recent Results (from the past 240  hour(s))  MRSA PCR SCREENING     Status: None   Collection Time    06/08/13  4:43 PM      Result Value Range Status   MRSA by PCR NEGATIVE  NEGATIVE Final   Comment:            The GeneXpert MRSA Assay (FDA     approved for NASAL specimens     only), is one component of a     comprehensive MRSA colonization     surveillance program. It is not     intended to diagnose MRSA     infection nor to guide or     monitor treatment for     MRSA infections.      Studies:  Ct Head Wo Contrast  06/08/2013   *RADIOLOGY REPORT*  Clinical Data: Right frontal headache.  Dizziness. Thrombocytopenia.  CT HEAD WITHOUT CONTRAST  Technique:  Contiguous axial images were obtained from the base of the skull through the vertex without contrast.  Comparison: Head CT 02/20/2012  Findings: The brain has a normal appearance without evidence of malformation,  atrophy, old or acute infarction, mass lesion, hemorrhage, hydrocephalus or extra-axial collection.  The calvarium is unremarkable.  Sinuses, middle ears and mastoids are clear.  IMPRESSION: Normal head CT   Original Report Authenticated By: Paulina Fusi, M.D.    Assessment: 32 y.o. [place] woman with a history of ITP initially diagnosed in 2007, treated with steroids to remission, relapsing in 2013, again responding to steroids, now in 2d relapse    Plan: DIC panel shows no schistocytes, as expected. Platelets are starting to climb. If they are >20K tomorrow could consider discharge. I will follow closely as outpatient, with lab/ visit/ Rituxan this Friday 6/13.Will change decadron to PO. Will write for tramadol for the headache.   Lowella Dell, MD 06/09/2013  1:33 PM

## 2013-06-09 NOTE — Progress Notes (Signed)
[id number] Earlene Plater, RN, BSN, CCM:  CHART REVIEWED AND UPDATED.  Next chart review due on [id number]. NO DISCHARGE NEEDS PRESENT AT THIS TIME. CASE MANAGEMENT [phone]

## 2013-06-10 ENCOUNTER — Other Ambulatory Visit: Payer: Self-pay

## 2013-06-10 ENCOUNTER — Other Ambulatory Visit: Payer: Self-pay | Admitting: Oncology

## 2013-06-10 ENCOUNTER — Inpatient Hospital Stay (HOSPITAL_COMMUNITY): Payer: BC Managed Care – PPO

## 2013-06-10 ENCOUNTER — Telehealth: Payer: Self-pay | Admitting: Oncology

## 2013-06-10 ENCOUNTER — Telehealth: Payer: Self-pay | Admitting: *Deleted

## 2013-06-10 DIAGNOSIS — R7309 Other abnormal glucose: Secondary | ICD-10-CM

## 2013-06-10 DIAGNOSIS — R079 Chest pain, unspecified: Secondary | ICD-10-CM

## 2013-06-10 DIAGNOSIS — R739 Hyperglycemia, unspecified: Secondary | ICD-10-CM

## 2013-06-10 DIAGNOSIS — E871 Hypo-osmolality and hyponatremia: Secondary | ICD-10-CM

## 2013-06-10 LAB — CBC
HCT: 32.3 % — ABNORMAL LOW (ref 36.0–46.0)
Hemoglobin: 11.1 g/dL — ABNORMAL LOW (ref 12.0–15.0)
MCH: 29.1 pg (ref 26.0–34.0)
MCHC: 34.4 g/dL (ref 30.0–36.0)
MCV: 84.6 fL (ref 78.0–100.0)
Platelets: 81 10*3/uL — ABNORMAL LOW (ref 150–400)
RBC: 3.82 MIL/uL — ABNORMAL LOW (ref 3.87–5.11)
RDW: 14.5 % (ref 11.5–15.5)
WBC: 13.3 10*3/uL — ABNORMAL HIGH (ref 4.0–10.5)

## 2013-06-10 LAB — GLUCOSE, CAPILLARY
Glucose-Capillary: 130 mg/dL — ABNORMAL HIGH (ref 70–99)
Glucose-Capillary: 135 mg/dL — ABNORMAL HIGH (ref 70–99)
Glucose-Capillary: 117 mg/dL — ABNORMAL HIGH (ref 70–99)
Glucose-Capillary: 128 mg/dL — ABNORMAL HIGH (ref 70–99)

## 2013-06-10 LAB — TROPONIN I: Troponin I: <0.3 ng/mL (ref <0.30)

## 2013-06-10 MED ORDER — GI COCKTAIL ~~LOC~~
30.0000 mL | Freq: Once | ORAL | Status: AC
  Administered 2013-06-10: 30 mL via ORAL
  Filled 2013-06-10 (×2): qty 30

## 2013-06-10 MED ORDER — FAMOTIDINE 20 MG PO TABS
20.0000 mg | ORAL_TABLET | Freq: Two times a day (BID) | ORAL | Status: DC
  Administered 2013-06-11 (×3): 20 mg via ORAL
  Filled 2013-06-10 (×6): qty 1

## 2013-06-10 MED ORDER — DEXAMETHASONE 4 MG PO TABS
4.0000 mg | ORAL_TABLET | Freq: Once | ORAL | Status: AC
  Administered 2013-06-10: 4 mg via ORAL
  Filled 2013-06-10: qty 1

## 2013-06-10 MED ORDER — SODIUM CHLORIDE 0.9 % IV SOLN
INTRAVENOUS | Status: DC
  Administered 2013-06-10: 19:00:00 via INTRAVENOUS

## 2013-06-10 MED ORDER — DEXAMETHASONE 4 MG PO TABS
4.0000 mg | ORAL_TABLET | Freq: Every day | ORAL | Status: DC
  Administered 2013-06-11 – 2013-06-12 (×2): 4 mg via ORAL
  Filled 2013-06-10 (×3): qty 1

## 2013-06-10 NOTE — Progress Notes (Signed)
Hinda Lindor   DOB:Oct 12, 1981   WU#:[id number]   GNF#:[id number]  Subjective: Vicenta is "fine," no bleeding, tolerated IVIG well, tolerating dexamethasone without significant complications   Objective: young African American woman examined in bed Filed Vitals:   06/10/13 0800  BP:   Pulse:   Temp: 97.7 F (36.5 C)  Resp:     Body mass index is 37.86 kg/(m^2).  Intake/Output Summary (Last 24 hours) at 06/10/13 0812 Last data filed at 06/09/13 2036  Gross per 24 hour  Intake    840 ml  Output      0 ml  Net    840 ml     Sclerae unicteric  Oropharynx clear  No peripheral adenopathy  Lungs clear -- no rales or rhonchi  Heart regular rate and rhythm  Abdomen benign  MSK no focal spinal tenderness, no peripheral edema  Neuro nonfocal  Breast exam: deferred  CBG (last 3)   Recent Labs  06/09/13 1130 06/09/13 2248 06/10/13 0734  GLUCAP 117* 129* 135*     Labs:  Lab Results  Component Value Date   WBC 13.3* 06/10/2013   HGB 11.1* 06/10/2013   HCT 32.3* 06/10/2013   MCV 84.6 06/10/2013   PLT 81* 06/10/2013   NEUTROABS 5.3 06/08/2013    @LASTCHEMISTRY @  Urine Studies No results found for this basename: UACOL, UAPR, USPG, UPH, UTP, UGL, UKET, UBIL, UHGB, UNIT, UROB, ULEU, UEPI, UWBC, URBC, UBAC, CAST, CRYS, UCOM, BILUA,  in the last 72 hours  Basic Metabolic Panel:  Recent Labs Lab 06/08/13 1315 06/09/13 0330  NA 136 129*  K 3.6 3.8  CL 101 99  CO2 26 22  GLUCOSE 90 242*  BUN 5* 11  CREATININE 0.68 0.73  CALCIUM 9.4 9.2   GFR Estimated Creatinine Clearance: 144.5 ml/min (by C-G formula based on Cr of 0.73). Liver Function Tests:  Recent Labs Lab 06/08/13 1315  AST 45*  ALT 35  ALKPHOS 67  BILITOT 0.9  PROT 7.2  ALBUMIN 3.9   No results found for this basename: LIPASE, AMYLASE,  in the last 168 hours No results found for this basename: AMMONIA,  in the last 168 hours Coagulation profile  Recent Labs Lab 06/08/13 1710 06/09/13 0330  INR  1.22 1.17    CBC:  Recent Labs Lab 06/08/13 1031 06/08/13 1315 06/08/13 1710 06/09/13 0330 06/10/13 0353  WBC 8.7 7.7  --  8.3 13.3*  NEUTROABS 6.5 5.3  --   --   --   HGB 12.5 12.7  --  11.9* 11.1*  HCT 36.3 36.2  --  34.1* 32.3*  MCV 80.7 79.9  --  81.4 84.6  PLT 5* <5* <5* 13*  13* 81*   Cardiac Enzymes: No results found for this basename: CKTOTAL, CKMB, CKMBINDEX, TROPONINI,  in the last 168 hours BNP: No components found with this basename: POCBNP,  CBG:  Recent Labs Lab 06/09/13 0727 06/09/13 1130 06/09/13 2248 06/10/13 0734  GLUCAP 154* 117* 129* 135*   D-Dimer  Recent Labs  06/08/13 1710 06/09/13 0330  DDIMER 0.27 0.47   Hgb A1c No results found for this basename: HGBA1C,  in the last 72 hours Lipid Profile No results found for this basename: CHOL, HDL, LDLCALC, TRIG, CHOLHDL, LDLDIRECT,  in the last 72 hours Thyroid function studies No results found for this basename: TSH, T4TOTAL, FREET3, T3FREE, THYROIDAB,  in the last 72 hours Anemia work up No results found for this basename: VITAMINB12, FOLATE, FERRITIN, TIBC, IRON, RETICCTPCT,  in  the last 72 hours Microbiology Recent Results (from the past 240 hour(s))  MRSA PCR SCREENING     Status: None   Collection Time    06/08/13  4:43 PM      Result Value Range Status   MRSA by PCR NEGATIVE  NEGATIVE Final   Comment:            The GeneXpert MRSA Assay (FDA     approved for NASAL specimens     only), is one component of a     comprehensive MRSA colonization     surveillance program. It is not     intended to diagnose MRSA     infection nor to guide or     monitor treatment for     MRSA infections.      Studies:  Ct Head Wo Contrast  06/08/2013   *RADIOLOGY REPORT*  Clinical Data: Right frontal headache.  Dizziness. Thrombocytopenia.  CT HEAD WITHOUT CONTRAST  Technique:  Contiguous axial images were obtained from the base of the skull through the vertex without contrast.  Comparison: Head  CT 02/20/2012  Findings: The brain has a normal appearance without evidence of malformation, atrophy, old or acute infarction, mass lesion, hemorrhage, hydrocephalus or extra-axial collection.  The calvarium is unremarkable.  Sinuses, middle ears and mastoids are clear.  IMPRESSION: Normal head CT   Original Report Authenticated By: Paulina Fusi, M.D.    Assessment: 32 y.o. [place] woman with a history of ITP dating back to 2007, with relapses 2013 and current, no evidence of DIC or TTP on smear, only treatment in past being steroids; currently day 3 steroids and IVIG, with an excellent initial response    Plan: I am decreasing her dexamethasone to 4 mg po QAM and that should be her discharge dose. We will taper later, after she has received at least 2 doses of rituxan. I have scheduled the patient to [facility]--she is aware. We will start weekly rituximab at that time.  I am comfortable with discharge to home today. Will sign off.   Lowella Dell, MD 06/10/2013  8:12 AM

## 2013-06-10 NOTE — Telephone Encounter (Signed)
Per staff phone call and POF I have schedueld appts.  JMW  

## 2013-06-10 NOTE — Progress Notes (Signed)
Dr. Arbutus Leas notified of EKG unconfirmed report. Celine Dishman, Georga Hacking, RN

## 2013-06-10 NOTE — Progress Notes (Signed)
[facility] PROGRESS NOTE  Bailey Sheppard UJW:[id number] DOB: 1981-02-03 DOA: 06/08/2013 PCP: Hal Morales, MD  Assessment/Plan: Idiopathic thrombocytopenia purpura -Patient has received 4 doses of dexamethasone, 2 doses IVIG -Platelets are recovering -No active signs of bleeding -Continue weaning dexamethasone -appreciate Dr. Darnelle Catalan -pepcid while on steroids Chest discomfort -EKG suggests early repolarization--no PR depression to suggest pericarditis -Cycle troponins -Chest x-ray--borderline cardiomegaly without infiltrate  -trial GI cocktail as pt indicates hx GERD and better with TUMS in past -D-Dimer negative x 2 this admission History of iron deficiency anemia  -Hemoglobin is stable  Leukocytosis  -Likely due to steroids  Hyperglycemia  -Likely due to steroids  Hyponatremia  -Recheck BMP     Family Communication:   Pt at beside Disposition Plan:   Home  06/11/13 if stable     Procedures/Studies: Dg Chest 2 View  06/10/2013   *RADIOLOGY REPORT*  Clinical Data: Shortness of breath, chest pain, history of ITP  CHEST - 2 VIEW  Comparison: None.  Findings: Borderline heart size but normal vascularity.  Negative for CHF, pneumonia, collapse, consolidation, effusion or pneumothorax.  Trachea midline.  Mildly prominent right paratracheal density could represent a vascular shadow.  Difficult to completely exclude paratracheal adenopathy.  Trachea is midline.  IMPRESSION: Borderline cardiac enlargement without CHF or pneumonia.  Prominent right paratracheal density may represent vascular shadow versus adenopathy.   Original Report Authenticated By: Judie Petit. Miles Costain, M.D.   Ct Head Wo Contrast  06/08/2013   *RADIOLOGY REPORT*  Clinical Data: Right frontal headache.  Dizziness. Thrombocytopenia.  CT HEAD WITHOUT CONTRAST  Technique:  Contiguous axial images were obtained from the base of the skull through the vertex without contrast.  Comparison: Head CT 02/20/2012  Findings: The  brain has a normal appearance without evidence of malformation, atrophy, old or acute infarction, mass lesion, hemorrhage, hydrocephalus or extra-axial collection.  The calvarium is unremarkable.  Sinuses, middle ears and mastoids are clear.  IMPRESSION: Normal head CT   Original Report Authenticated By: Paulina Fusi, M.D.         Subjective: Patient complains of some chest discomfort particularly when she lies flat. Denies any shortness of breath, nausea, vomiting, diarrhea, abdominal pain, dizziness, fevers, chills   Objective: Filed Vitals:   06/10/13 1200 06/10/13 1300 06/10/13 1600 06/10/13 1732  BP:  132/73  136/76  Pulse:  58 88 62  Temp: 98 F (36.7 C)  98.8 F (37.1 C) 98.6 F (37 C)  TempSrc: Oral  Oral Oral  Resp:  15 19 20   Height:      Weight:      SpO2:  100% 94% 99%    Intake/Output Summary (Last 24 hours) at 06/10/13 1812 Last data filed at 06/10/13 1300  Gross per 24 hour  Intake    600 ml  Output      0 ml  Net    600 ml   Weight change:  Exam:   General:  Pt is alert, follows commands appropriately, not in acute distress  HEENT: No icterus, No thrush, [place]/AT  Cardiovascular: RRR, S1/S2, no rubs, no gallops  Respiratory: CTA bilaterally, no wheezing, no crackles, no rhonchi  Abdomen: Soft/+BS, non tender, non distended, no guarding  Extremities: No edema, No lymphangitis, No petechiae, No rashes, no synovitis  Data Reviewed: Basic Metabolic Panel:  Recent Labs Lab 06/08/13 1315 06/09/13 0330  NA 136 129*  K 3.6 3.8  CL 101 99  CO2 26 22  GLUCOSE 90 242*  BUN 5* 11  CREATININE  0.68 0.73  CALCIUM 9.4 9.2   Liver Function Tests:  Recent Labs Lab 06/08/13 1315  AST 45*  ALT 35  ALKPHOS 67  BILITOT 0.9  PROT 7.2  ALBUMIN 3.9   No results found for this basename: LIPASE, AMYLASE,  in the last 168 hours No results found for this basename: AMMONIA,  in the last 168 hours CBC:  Recent Labs Lab 06/08/13 1031 06/08/13 1315  06/08/13 1710 06/09/13 0330 06/10/13 0353  WBC 8.7 7.7  --  8.3 13.3*  NEUTROABS 6.5 5.3  --   --   --   HGB 12.5 12.7  --  11.9* 11.1*  HCT 36.3 36.2  --  34.1* 32.3*  MCV 80.7 79.9  --  81.4 84.6  PLT 5* <5* <5* 13*  13* 81*   Cardiac Enzymes:  Recent Labs Lab 06/10/13 1530  TROPONINI <0.30   BNP: No components found with this basename: POCBNP,  CBG:  Recent Labs Lab 06/09/13 1659 06/09/13 2248 06/10/13 0734 06/10/13 1225 06/10/13 1649  GLUCAP 130* 129* 135* 117* 169*    Recent Results (from the past 240 hour(s))  MRSA PCR SCREENING     Status: None   Collection Time    06/08/13  4:43 PM      Result Value Range Status   MRSA by PCR NEGATIVE  NEGATIVE Final   Comment:            The GeneXpert MRSA Assay (FDA     approved for NASAL specimens     only), is one component of a     comprehensive MRSA colonization     surveillance program. It is not     intended to diagnose MRSA     infection nor to guide or     monitor treatment for     MRSA infections.     Scheduled Meds: . [START ON 06/11/2013] dexamethasone  4 mg Oral Q breakfast  . famotidine  20 mg Oral BID  . gi cocktail  30 mL Oral Once  . IMMUNE GLOBULIN 10% (HUMAN) IV - For Fluid Restriction Only  1 g/kg Intravenous Q24H  . insulin aspart  0-5 Units Subcutaneous QHS  . insulin aspart  0-9 Units Subcutaneous TID WC  . polyethylene glycol  17 g Oral Daily  . sodium chloride  3 mL Intravenous Q12H   Continuous Infusions:    Bailey Baby, DO  [facility] Pager [phone]  If 7PM-7AM, please contact night-coverage [email or web address] Password [email or web address] 06/10/2013, 6:12 PM   LOS: 2 days

## 2013-06-11 ENCOUNTER — Other Ambulatory Visit: Payer: BC Managed Care – PPO

## 2013-06-11 ENCOUNTER — Other Ambulatory Visit: Payer: Self-pay | Admitting: Physician Assistant

## 2013-06-11 ENCOUNTER — Telehealth: Payer: Self-pay | Admitting: *Deleted

## 2013-06-11 DIAGNOSIS — I309 Acute pericarditis, unspecified: Secondary | ICD-10-CM

## 2013-06-11 DIAGNOSIS — D509 Iron deficiency anemia, unspecified: Secondary | ICD-10-CM

## 2013-06-11 DIAGNOSIS — D693 Immune thrombocytopenic purpura: Secondary | ICD-10-CM

## 2013-06-11 DIAGNOSIS — R9431 Abnormal electrocardiogram [ECG] [EKG]: Secondary | ICD-10-CM

## 2013-06-11 DIAGNOSIS — I519 Heart disease, unspecified: Secondary | ICD-10-CM

## 2013-06-11 HISTORY — DX: Acute pericarditis, unspecified: I30.9

## 2013-06-11 LAB — BASIC METABOLIC PANEL
BUN: 13 mg/dL (ref 6–23)
CO2: 28 mEq/L (ref 19–32)
Calcium: 8.4 mg/dL (ref 8.4–10.5)
Creatinine, Ser: 0.63 mg/dL (ref 0.50–1.10)
GFR calc non Af Amer: >90 mL/min (ref >90)
Glucose, Bld: 125 mg/dL — ABNORMAL HIGH (ref 70–99)
Sodium: 133 mEq/L — ABNORMAL LOW (ref 135–145)

## 2013-06-11 LAB — CBC
HCT: 30.5 % — ABNORMAL LOW (ref 36.0–46.0)
Hemoglobin: 10.7 g/dL — ABNORMAL LOW (ref 12.0–15.0)
MCH: 29.2 pg (ref 26.0–34.0)
MCHC: 35.1 g/dL (ref 30.0–36.0)
MCV: 83.3 fL (ref 78.0–100.0)
RBC: 3.66 MIL/uL — ABNORMAL LOW (ref 3.87–5.11)

## 2013-06-11 LAB — GLUCOSE, CAPILLARY
Glucose-Capillary: 103 mg/dL — ABNORMAL HIGH (ref 70–99)
Glucose-Capillary: 135 mg/dL — ABNORMAL HIGH (ref 70–99)
Glucose-Capillary: 124 mg/dL — ABNORMAL HIGH (ref 70–99)

## 2013-06-11 NOTE — Progress Notes (Signed)
Echocardiogram 2D Echocardiogram has been performed.  Bailey Sheppard 06/11/2013, 10:43 AM

## 2013-06-11 NOTE — Progress Notes (Signed)
[facility] PROGRESS NOTE  Bailey Sheppard XBM:[id number] DOB: January 04, 1981 DOA: 06/08/2013 PCP: Hal Morales, MD  Brief history 32 year old female with history ITP presents to the hospital on 06/08/2013 with a few day history of nontraumatic bruising on her legs, bleeding gums. Labs in the emergency department revealed a platelet count less than 5000. Hematology, Dr. Darnelle Catalan, was consulted. The patient was started on intravenous dexamethasone and IVIG. Platelets counts have improved. The patient was weaned to oral dexamethasone. During admission, the patient endorsed a two-week history of right-sided and substernal chest discomfort. She stated that she has tried Tums in the past without much relief. The chest discomfort is elicited when the patient lies supine and relieved when the patient sits up. In addition, the patient also endorses a history of decreasing endurance with some dyspnea on exertion. There was no fevers, chills, recent viral illness. EKG showed ST elevation in V3 to V6 suggestive of pericarditis versus early repolarization. Troponins are negative x3. The patient has remained hemodynamically stable. Cardiology was consulted for an opinion.  Assessment/Plan: Idiopathic thrombocytopenia purpura  -Patient has received 4 doses of dexamethasone, 2 doses IVIG  -Platelets are recovering  -No active signs of bleeding  -Continue weaning dexamethasone  -appreciate Dr. Darnelle Catalan  -pepcid while on steroids  Chest discomfort/abnormal EKG -EKG with ST elevation V3-V6 without reciprocal PR depression--> concerned about pericarditis versus early repolarization -Patient continues to have chest discomfort in the supine position relieved when she sits up -Of note, the patient's chest discomfort has not significantly improved with IV steroids -troponins--negative x3 -Chest x-ray--borderline cardiomegaly without infiltrate  -consult cardiology -D-Dimer negative x 2 this admission   -echocardiogram History of iron deficiency anemia  -Hemoglobin is stable  Leukocytosis  -Likely due to steroids  Hyperglycemia  -Likely due to steroids  Hyponatremia  -Recheck BMP   Family Communication: Pt at beside  Disposition Plan: Home when medically stable         Procedures/Studies: Dg Chest 2 View  06/10/2013   *RADIOLOGY REPORT*  Clinical Data: Shortness of breath, chest pain, history of ITP  CHEST - 2 VIEW  Comparison: None.  Findings: Borderline heart size but normal vascularity.  Negative for CHF, pneumonia, collapse, consolidation, effusion or pneumothorax.  Trachea midline.  Mildly prominent right paratracheal density could represent a vascular shadow.  Difficult to completely exclude paratracheal adenopathy.  Trachea is midline.  IMPRESSION: Borderline cardiac enlargement without CHF or pneumonia.  Prominent right paratracheal density may represent vascular shadow versus adenopathy.   Original Report Authenticated By: Judie Petit. Miles Costain, M.D.   Ct Head Wo Contrast  06/08/2013   *RADIOLOGY REPORT*  Clinical Data: Right frontal headache.  Dizziness. Thrombocytopenia.  CT HEAD WITHOUT CONTRAST  Technique:  Contiguous axial images were obtained from the base of the skull through the vertex without contrast.  Comparison: Head CT 02/20/2012  Findings: The brain has a normal appearance without evidence of malformation, atrophy, old or acute infarction, mass lesion, hemorrhage, hydrocephalus or extra-axial collection.  The calvarium is unremarkable.  Sinuses, middle ears and mastoids are clear.  IMPRESSION: Normal head CT   Original Report Authenticated By: Paulina Fusi, M.D.         Subjective: Patient continues to endorse chest discomfort when she lies flat. This improves when she sits up. She denies any current shortness of breath. She does complain of some nausea without any emesis. Denies any dizziness, syncope, abdominal pain, dysuria, hematuria.  Objective: Filed Vitals:    06/10/13 1600 06/10/13 1732 06/10/13 2126 06/11/13 0272  BP:  136/76 109/70 121/68  Pulse: 88 62 51 62  Temp: 98.8 F (37.1 C) 98.6 F (37 C) 98.3 F (36.8 C) 99 F (37.2 C)  TempSrc: Oral Oral Oral Oral  Resp: 19 20 20 18   Height:      Weight:    117.708 kg (259 lb 8 oz)  SpO2: 94% 99% 99% 98%    Intake/Output Summary (Last 24 hours) at 06/11/13 0843 Last data filed at 06/11/13 0149  Gross per 24 hour  Intake 996.25 ml  Output      0 ml  Net 996.25 ml   Weight change:  Exam:   General:  Pt is alert, follows commands appropriately, not in acute distress  HEENT: No icterus, No thrush,[place]/AT  Cardiovascular: RRR, S1/S2, no rubs, no gallops  Respiratory: CTA bilaterally, no wheezing, no crackles, no rhonchi  Abdomen: Soft/+BS, non tender, non distended, no guarding  Extremities: trace edema, No lymphangitis, No petechiae, No rashes, no synovitis  Data Reviewed: Basic Metabolic Panel:  Recent Labs Lab 06/08/13 1315 06/09/13 0330 06/11/13 0354  NA 136 129* 133*  K 3.6 3.8 3.7  CL 101 99 101  CO2 26 22 28   GLUCOSE 90 242* 125*  BUN 5* 11 13  CREATININE 0.68 0.73 0.63  CALCIUM 9.4 9.2 8.4  MG  --   --  2.0   Liver Function Tests:  Recent Labs Lab 06/08/13 1315  AST 45*  ALT 35  ALKPHOS 67  BILITOT 0.9  PROT 7.2  ALBUMIN 3.9   No results found for this basename: LIPASE, AMYLASE,  in the last 168 hours No results found for this basename: AMMONIA,  in the last 168 hours CBC:  Recent Labs Lab 06/08/13 1031 06/08/13 1315 06/08/13 1710 06/09/13 0330 06/10/13 0353 06/11/13 0354  WBC 8.7 7.7  --  8.3 13.3* 12.0*  NEUTROABS 6.5 5.3  --   --   --   --   HGB 12.5 12.7  --  11.9* 11.1* 10.7*  HCT 36.3 36.2  --  34.1* 32.3* 30.5*  MCV 80.7 79.9  --  81.4 84.6 83.3  PLT 5* <5* <5* 13*  13* 81* 159   Cardiac Enzymes:  Recent Labs Lab 06/10/13 1530 06/10/13 2052 06/11/13 0354  TROPONINI <0.30 <0.30 <0.30   BNP: No components found with this  basename: POCBNP,  CBG:  Recent Labs Lab 06/10/13 0734 06/10/13 1225 06/10/13 1649 06/10/13 2122 06/11/13 0726  GLUCAP 135* 117* 169* 128* 103*    Recent Results (from the past 240 hour(s))  MRSA PCR SCREENING     Status: None   Collection Time    06/08/13  4:43 PM      Result Value Range Status   MRSA by PCR NEGATIVE  NEGATIVE Final   Comment:            The GeneXpert MRSA Assay (FDA     approved for NASAL specimens     only), is one component of a     comprehensive MRSA colonization     surveillance program. It is not     intended to diagnose MRSA     infection nor to guide or     monitor treatment for     MRSA infections.     Scheduled Meds: . dexamethasone  4 mg Oral Q breakfast  . famotidine  20 mg Oral BID  . insulin aspart  0-5 Units Subcutaneous QHS  . insulin aspart  0-9 Units Subcutaneous TID WC  .  polyethylene glycol  17 g Oral Daily  . sodium chloride  3 mL Intravenous Q12H   Continuous Infusions: . sodium chloride Stopped (06/11/13 0149)     Yarlin Breisch, DO  [facility] Pager [phone]  If 7PM-7AM, please contact night-coverage [email or web address] Password [facility] 06/11/2013, 8:43 AM   LOS: 3 days

## 2013-06-11 NOTE — Consult Note (Signed)
Reason for Consult: Pericarditis Referring Physician: Dr. Arbutus Leas [facility])  HPI: The patient is a 32 y/o AAF with a history of ITP, diagnosed in 2009. Her hematologist is Dr. Gita Kudo. She presented to Dr. Renelda Loma office on 06/10/13 with a complaint of spontaneous bruising, bleeding from her gums from tooth brushing and petechiae.  A CBC showed a platelet count of less than 5,000. Subsequently, she was admitted to [facility] for acute ITP. The patient had also endorsed chest pain for the past 2 weeks. The pain is described as a sharp, piercing pain that is worse in the supine position and relieved sitting forward. This has never happened to her before. The pain, at times, is also pressure like, with associated SOB when she attempts to lie flat. She has felt a bit lightheaded at times, but denies syncope/presyncope. No cough. No recent fevers, but she does endorse experiencing rigors several nights ago.    Past Medical History  Diagnosis Date  . ITP (idiopathic thrombocytopenic purpura)   . ITP (idiopathic thrombocytopenic purpura)     Past Surgical History  Procedure Laterality Date  . Umbilical hernia repair      Family History  Problem Relation Age of Onset  . Hypertension Mother   . Diabetes Father     Social History:  reports that she has never smoked. She has never used smokeless tobacco. She reports that  drinks alcohol. She reports that she does not use illicit drugs.  Allergies: No Known Allergies  Medications:  Prior to Admission medications   Medication Sig Start Date End Date Taking? Authorizing Provider  acetaminophen (TYLENOL) 500 MG tablet Take 500 mg by mouth every 6 (six) hours as needed for pain.   Yes Historical Provider, MD     Results for orders placed during the hospital encounter of 06/08/13 (from the past 48 hour(s))  GLUCOSE, CAPILLARY     Status: Abnormal   Collection Time    06/09/13  4:59 PM      Result Value Range   Glucose-Capillary 130 (*) 70 - 99  mg/dL   Comment 1 Documented in Chart     Comment 2 Notify RN    GLUCOSE, CAPILLARY     Status: Abnormal   Collection Time    06/09/13 10:48 PM      Result Value Range   Glucose-Capillary 129 (*) 70 - 99 mg/dL   Comment 1 Documented in Chart     Comment 2 Notify RN    CBC     Status: Abnormal   Collection Time    06/10/13  3:53 AM      Result Value Range   WBC 13.3 (*) 4.0 - 10.5 K/uL   RBC 3.82 (*) 3.87 - 5.11 MIL/uL   Hemoglobin 11.1 (*) 12.0 - 15.0 g/dL   HCT 40.9 (*) 81.1 - 91.4 %   MCV 84.6  78.0 - 100.0 fL   MCH 29.1  26.0 - 34.0 pg   MCHC 34.4  30.0 - 36.0 g/dL   RDW 78.2  95.6 - 21.3 %   Platelets 81 (*) 150 - 400 K/uL   Comment: REPEATED TO VERIFY     DELTA CHECK NOTED  GLUCOSE, CAPILLARY     Status: Abnormal   Collection Time    06/10/13  7:34 AM      Result Value Range   Glucose-Capillary 135 (*) 70 - 99 mg/dL   Comment 1 Documented in Chart     Comment 2 Notify RN  GLUCOSE, CAPILLARY     Status: Abnormal   Collection Time    06/10/13 12:25 PM      Result Value Range   Glucose-Capillary 117 (*) 70 - 99 mg/dL   Comment 1 Documented in Chart     Comment 2 Notify RN    TROPONIN I     Status: None   Collection Time    06/10/13  3:30 PM      Result Value Range   Troponin I <0.30  <0.30 ng/mL   Comment:            Due to the release kinetics of cTnI,     a negative result within the first hours     of the onset of symptoms does not rule out     myocardial infarction with certainty.     If myocardial infarction is still suspected,     repeat the test at appropriate intervals.  GLUCOSE, CAPILLARY     Status: Abnormal   Collection Time    06/10/13  4:49 PM      Result Value Range   Glucose-Capillary 169 (*) 70 - 99 mg/dL   Comment 1 Documented in Chart     Comment 2 Notify RN    TROPONIN I     Status: None   Collection Time    06/10/13  8:52 PM      Result Value Range   Troponin I <0.30  <0.30 ng/mL   Comment:            Due to the release kinetics  of cTnI,     a negative result within the first hours     of the onset of symptoms does not rule out     myocardial infarction with certainty.     If myocardial infarction is still suspected,     repeat the test at appropriate intervals.  GLUCOSE, CAPILLARY     Status: Abnormal   Collection Time    06/10/13  9:22 PM      Result Value Range   Glucose-Capillary 128 (*) 70 - 99 mg/dL  TROPONIN I     Status: None   Collection Time    06/11/13  3:54 AM      Result Value Range   Troponin I <0.30  <0.30 ng/mL   Comment:            Due to the release kinetics of cTnI,     a negative result within the first hours     of the onset of symptoms does not rule out     myocardial infarction with certainty.     If myocardial infarction is still suspected,     repeat the test at appropriate intervals.  CBC     Status: Abnormal   Collection Time    06/11/13  3:54 AM      Result Value Range   WBC 12.0 (*) 4.0 - 10.5 K/uL   RBC 3.66 (*) 3.87 - 5.11 MIL/uL   Hemoglobin 10.7 (*) 12.0 - 15.0 g/dL   HCT 45.4 (*) 09.8 - 11.9 %   MCV 83.3  78.0 - 100.0 fL   MCH 29.2  26.0 - 34.0 pg   MCHC 35.1  30.0 - 36.0 g/dL   RDW 14.7  82.9 - 56.2 %   Platelets 159  150 - 400 K/uL   Comment: REPEATED TO VERIFY     DELTA CHECK NOTED  BASIC METABOLIC PANEL  Status: Abnormal   Collection Time    06/11/13  3:54 AM      Result Value Range   Sodium 133 (*) 135 - 145 mEq/L   Potassium 3.7  3.5 - 5.1 mEq/L   Chloride 101  96 - 112 mEq/L   CO2 28  19 - 32 mEq/L   Glucose, Bld 125 (*) 70 - 99 mg/dL   BUN 13  6 - 23 mg/dL   Creatinine, Ser 1.61  0.50 - 1.10 mg/dL   Calcium 8.4  8.4 - 09.6 mg/dL   GFR calc non Af Amer >90  >90 mL/min   GFR calc Af Amer >90  >90 mL/min   Comment:            The eGFR has been calculated     using the CKD EPI equation.     This calculation has not been     validated in all clinical     situations.     eGFR's persistently     <90 mL/min signify     possible Chronic Kidney  Disease.  MAGNESIUM     Status: None   Collection Time    06/11/13  3:54 AM      Result Value Range   Magnesium 2.0  1.5 - 2.5 mg/dL  GLUCOSE, CAPILLARY     Status: Abnormal   Collection Time    06/11/13  7:26 AM      Result Value Range   Glucose-Capillary 103 (*) 70 - 99 mg/dL  GLUCOSE, CAPILLARY     Status: Abnormal   Collection Time    06/11/13 11:18 AM      Result Value Range   Glucose-Capillary 135 (*) 70 - 99 mg/dL    Dg Chest 2 View  0/45/4098   *RADIOLOGY REPORT*  Clinical Data: Shortness of breath, chest pain, history of ITP  CHEST - 2 VIEW  Comparison: None.  Findings: Borderline heart size but normal vascularity.  Negative for CHF, pneumonia, collapse, consolidation, effusion or pneumothorax.  Trachea midline.  Mildly prominent right paratracheal density could represent a vascular shadow.  Difficult to completely exclude paratracheal adenopathy.  Trachea is midline.  IMPRESSION: Borderline cardiac enlargement without CHF or pneumonia.  Prominent right paratracheal density may represent vascular shadow versus adenopathy.   Original Report Authenticated By: Judie Petit. Miles Costain, M.D.    Review of Systems  Constitutional: Positive for chills. Negative for fever, malaise/fatigue and diaphoresis.  HENT: Negative for congestion and sore throat.   Eyes: Negative for blurred vision, double vision and pain.  Respiratory: Positive for shortness of breath. Negative for cough, hemoptysis and wheezing.   Cardiovascular: Positive for chest pain and orthopnea. Negative for palpitations, claudication, leg swelling and PND.  Gastrointestinal: Positive for nausea. Negative for vomiting, abdominal pain, diarrhea and constipation.       Pt states that urine and stools have been orange in color for for past 24 hrs (see thinks this is related to medication and not blood).  Genitourinary: Negative for dysuria, urgency and frequency.  Musculoskeletal: Negative for back pain and falls.  Skin:       + for  bruising and petechiae of the extremities  Neurological: Positive for dizziness. Negative for focal weakness, seizures, loss of consciousness, weakness and headaches.  Endo/Heme/Allergies: Bruises/bleeds easily.   Blood pressure 124/80, pulse 66, temperature 98.4 F (36.9 C), temperature source Oral, resp. rate 20, height 5\' 10"  (1.778 m), weight 259 lb 8 oz (117.708 kg), SpO2 99.00%. Physical Exam  Constitutional: She is oriented to person, place, and time. She appears well-developed and well-nourished. No distress.  HENT:  Head: Normocephalic and atraumatic.  Eyes: Conjunctivae and EOM are normal. Pupils are equal, round, and reactive to light.  Neck: No thyromegaly present.  Cardiovascular: Normal rate, regular rhythm and intact distal pulses.  Exam reveals no gallop and no friction rub.   No murmur heard. Pulses:      Radial pulses are 2+ on the right side, and 2+ on the left side.       Dorsalis pedis pulses are 2+ on the right side, and 2+ on the left side.  ? Pericardial friction rub  Respiratory: Effort normal and breath sounds normal. No respiratory distress. She has no wheezes. She has no rales.  GI: Soft. Bowel sounds are normal. She exhibits no distension. There is no tenderness.  Musculoskeletal: She exhibits no edema.  Lymphadenopathy:    She has no cervical adenopathy.  Neurological: She is alert and oriented to person, place, and time.  Skin: Skin is warm and dry. Bruising and petechiae noted. She is not diaphoretic.  Psychiatric: She has a normal mood and affect. Her behavior is normal.    Assessment/Plan: Active Problems:   ITP (idiopathic thrombocytopenic purpura)   Iron deficiency anemia   Hyponatremia   Chest pain   Hyperglycemia   Nonspecific abnormal electrocardiogram (ECG) (EKG)   Acute pericarditis, unspecified  Plan:  troponins negative x 3. EKG shows diffuse ST elevations. Chest pain is consistent with pericarditis. Echo showed no effusion. Due to  acute ITP, cannot treat with NSAIDS. Would recommend treating with a steroid taper, as well as colchicine for treatment. Dr. Herbie Baltimore to assess and will provide further recommendation.    Bailey Butcher, PA-C 06/11/2013, 4:21 PM   I have seen and evaluated the patient this PM along with Boyce Medici, PA. I agree with his findings, examination as well as impression recommendations.  Very pleasant young woman with h/o ITP - recurrent.  Admitted with Purpura & thrombocytopenia - s/p IV I G with brisk response.  Also notes positional sharp L sided CP -- worse with lying down, improves with sitting up.  ECG with diffuse mild STE, but negative bio-markers for MI.  Echo essentially normal.  Exam suggests soft Aortic Murmur (although no Echo findings to corroborate) -- could be soft friction rub also, just unusual location.    The combination of ECG findings & clinical symptoms would make pericarditis a likely diagnosis.  First line Rx would be NSAIDS + Colchcine.  -- would not use NSAIDS in the current setting with recent ITP flare.  Second line is steroids -- which she is on.  Preferentially long ~2 weeks taper -- ~60 mg bid x 1st day then 60 mg daily x 3 days --> 40mg  x 3, 20 mg x 3, 10 mg x 3.   I think a reasonable solution would be her ITP steroids + colchcine 0.6 mg po bid (provided that this regimen is OK from a Hematology perspective).    With normal Echo & hemodynamic stability, suspect that she would be OK for d/c once the plan is set for OP Rx.  Simply based upon the time today, would probably end up being in AM.   MD Time with pt: 15 min; total MD + PA time 45 min.   Marykay Lex, M.D., M.S. [facility] [address] [place], [place]  [address]  [phone] Pager # [phone]  06/11/2013 4:54 PM

## 2013-06-11 NOTE — Progress Notes (Signed)
Bailey Sheppard   DOB:08/06/1981   RU#:[id number]   BJY#:[id number]  Subjective:  From the point of view of her  thrombocytopenia, Bailey Sheppard isdoing terrific--platelets havenormalized, she istolerating the dexamethasone w/o complications. However her discharge has been delayed for evaluation of atypical chest pain and SOB, with EKG raising a question re. Acute pericarditis and echo results pending. SO in room  Objective: young African American woman in no acute distress  Filed Vitals:   06/11/13 0514  BP: 121/68  Pulse: 62  Temp: 99 F (37.2 C)  Resp: 18    Body mass index is 37.23 kg/(m^2).  Intake/Output Summary (Last 24 hours) at 06/11/13 1234 Last data filed at 06/11/13 0900  Gross per 24 hour  Intake 1116.25 ml  Output      0 ml  Net 1116.25 ml   Repeat exam not performed  .  CBG (last 3)   Recent Labs  06/10/13 2122 06/11/13 0726 06/11/13 1118  GLUCAP 128* 103* 135*     Labs:  Lab Results  Component Value Date   WBC 12.0* 06/11/2013   HGB 10.7* 06/11/2013   HCT 30.5* 06/11/2013   MCV 83.3 06/11/2013   PLT 159 06/11/2013   NEUTROABS 5.3 06/08/2013    @LASTCHEMISTRY @  Urine Studies No results found for this basename: UACOL, UAPR, USPG, UPH, UTP, UGL, UKET, UBIL, UHGB, UNIT, UROB, ULEU, UEPI, UWBC, URBC, UBAC, CAST, CRYS, UCOM, BILUA,  in the last 72 hours  Basic Metabolic Panel:  Recent Labs Lab 06/08/13 1315 06/09/13 0330 06/11/13 0354  NA 136 129* 133*  K 3.6 3.8 3.7  CL 101 99 101  CO2 26 22 28   GLUCOSE 90 242* 125*  BUN 5* 11 13  CREATININE 0.68 0.73 0.63  CALCIUM 9.4 9.2 8.4  MG  --   --  2.0   GFR Estimated Creatinine Clearance: 143.2 ml/min (by C-G formula based on Cr of 0.63). Liver Function Tests:  Recent Labs Lab 06/08/13 1315  AST 45*  ALT 35  ALKPHOS 67  BILITOT 0.9  PROT 7.2  ALBUMIN 3.9   No results found for this basename: LIPASE, AMYLASE,  in the last 168 hours No results found for this basename: AMMONIA,  in the last 168  hours Coagulation profile  Recent Labs Lab 06/08/13 1710 06/09/13 0330  INR 1.22 1.17    CBC:  Recent Labs Lab 06/08/13 1031 06/08/13 1315 06/08/13 1710 06/09/13 0330 06/10/13 0353 06/11/13 0354  WBC 8.7 7.7  --  8.3 13.3* 12.0*  NEUTROABS 6.5 5.3  --   --   --   --   HGB 12.5 12.7  --  11.9* 11.1* 10.7*  HCT 36.3 36.2  --  34.1* 32.3* 30.5*  MCV 80.7 79.9  --  81.4 84.6 83.3  PLT 5* <5* <5* 13*  13* 81* 159   Cardiac Enzymes:  Recent Labs Lab 06/10/13 1530 06/10/13 2052 06/11/13 0354  TROPONINI <0.30 <0.30 <0.30   BNP: No components found with this basename: POCBNP,  CBG:  Recent Labs Lab 06/10/13 1225 06/10/13 1649 06/10/13 2122 06/11/13 0726 06/11/13 1118  GLUCAP 117* 169* 128* 103* 135*   D-Dimer  Recent Labs  06/08/13 1710 06/09/13 0330  DDIMER 0.27 0.47   Hgb A1c No results found for this basename: HGBA1C,  in the last 72 hours Lipid Profile No results found for this basename: CHOL, HDL, LDLCALC, TRIG, CHOLHDL, LDLDIRECT,  in the last 72 hours Thyroid function studies No results found for this basename: TSH, T4TOTAL,  FREET3, T3FREE, THYROIDAB,  in the last 72 hours Anemia work up No results found for this basename: VITAMINB12, FOLATE, FERRITIN, TIBC, IRON, RETICCTPCT,  in the last 72 hours Microbiology Recent Results (from the past 240 hour(s))  MRSA PCR SCREENING     Status: None   Collection Time    06/08/13  4:43 PM      Result Value Range Status   MRSA by PCR NEGATIVE  NEGATIVE Final   Comment:            The GeneXpert MRSA Assay (FDA     approved for NASAL specimens     only), is one component of a     comprehensive MRSA colonization     surveillance program. It is not     intended to diagnose MRSA     infection nor to guide or     monitor treatment for     MRSA infections.      Studies:  Dg Chest 2 View  06/10/2013   *RADIOLOGY REPORT*  Clinical Data: Shortness of breath, chest pain, history of ITP  CHEST - 2 VIEW   Comparison: None.  Findings: Borderline heart size but normal vascularity.  Negative for CHF, pneumonia, collapse, consolidation, effusion or pneumothorax.  Trachea midline.  Mildly prominent right paratracheal density could represent a vascular shadow.  Difficult to completely exclude paratracheal adenopathy.  Trachea is midline.  IMPRESSION: Borderline cardiac enlargement without CHF or pneumonia.  Prominent right paratracheal density may represent vascular shadow versus adenopathy.   Original Report Authenticated By: Judie Petit. Miles Costain, M.D.    Assessment: 32 y.o. Bailey Sheppard woman with a history of ITP dating back to 2007, with relapses 2013 and current, no evidence of DIC or TTP on smear, only treatment in past being steroids; currently day 4 steroids and IVIG, with an excellent response    Plan: Discussedthe  Overall situation and we are keeping the plan to treat with Rituxan tomorrow if at all possible. The infusion takes about 8 hoursfirstdose, [facility]. If she is not discharged by then we will postpone treatment to next week.  Will follow with you.  Lowella Dell, MD 06/11/2013  12:34 PM

## 2013-06-12 ENCOUNTER — Encounter: Payer: Self-pay | Admitting: Family

## 2013-06-12 ENCOUNTER — Telehealth: Payer: Self-pay | Admitting: Cardiology

## 2013-06-12 ENCOUNTER — Ambulatory Visit (HOSPITAL_BASED_OUTPATIENT_CLINIC_OR_DEPARTMENT_OTHER): Payer: BC Managed Care – PPO

## 2013-06-12 ENCOUNTER — Other Ambulatory Visit (HOSPITAL_BASED_OUTPATIENT_CLINIC_OR_DEPARTMENT_OTHER): Payer: BC Managed Care – PPO | Admitting: Lab

## 2013-06-12 ENCOUNTER — Ambulatory Visit (HOSPITAL_BASED_OUTPATIENT_CLINIC_OR_DEPARTMENT_OTHER): Payer: BC Managed Care – PPO | Admitting: Family

## 2013-06-12 VITALS — BP 125/75 | HR 81 | Temp 97.8°F | Resp 20

## 2013-06-12 VITALS — BP 128/80 | HR 82 | Temp 98.6°F | Resp 20 | Ht 70.0 in | Wt 261.8 lb

## 2013-06-12 DIAGNOSIS — D693 Immune thrombocytopenic purpura: Secondary | ICD-10-CM

## 2013-06-12 DIAGNOSIS — R7309 Other abnormal glucose: Secondary | ICD-10-CM

## 2013-06-12 DIAGNOSIS — D72829 Elevated white blood cell count, unspecified: Secondary | ICD-10-CM

## 2013-06-12 DIAGNOSIS — Z5112 Encounter for antineoplastic immunotherapy: Secondary | ICD-10-CM

## 2013-06-12 DIAGNOSIS — T380X5A Adverse effect of glucocorticoids and synthetic analogues, initial encounter: Secondary | ICD-10-CM

## 2013-06-12 DIAGNOSIS — D509 Iron deficiency anemia, unspecified: Secondary | ICD-10-CM

## 2013-06-12 LAB — CBC WITH DIFFERENTIAL/PLATELET
BASO%: 0.2 % (ref 0.0–2.0)
EOS%: 0.7 % (ref 0.0–7.0)
HCT: 33.9 % — ABNORMAL LOW (ref 34.8–46.6)
LYMPH%: 21.3 % (ref 14.0–49.7)
MCH: 28.4 pg (ref 25.1–34.0)
MCHC: 34.2 g/dL (ref 31.5–36.0)
MCV: 82.9 fL (ref 79.5–101.0)
MONO#: 0.7 10*3/uL (ref 0.1–0.9)
MONO%: 5.3 % (ref 0.0–14.0)
NEUT%: 72.5 % (ref 38.4–76.8)
Platelets: 259 10*3/uL (ref 145–400)

## 2013-06-12 MED ORDER — SODIUM CHLORIDE 0.9 % IV SOLN
Freq: Once | INTRAVENOUS | Status: AC
  Administered 2013-06-12: 12:00:00 via INTRAVENOUS

## 2013-06-12 MED ORDER — TRAMADOL HCL 50 MG PO TABS: 50.0000 mg | ORAL_TABLET | Freq: Four times a day (QID) | ORAL | Status: DC | PRN

## 2013-06-12 MED ORDER — SODIUM CHLORIDE 0.9 % IV SOLN: 375.0000 mg/m2 | Freq: Once | INTRAVENOUS | Status: DC

## 2013-06-12 MED ORDER — COLCHICINE 0.6 MG PO TABS: 0.6000 mg | ORAL_TABLET | Freq: Two times a day (BID) | ORAL | Status: DC

## 2013-06-12 MED ORDER — DIPHENHYDRAMINE HCL 25 MG PO CAPS
25.0000 mg | ORAL_CAPSULE | Freq: Once | ORAL | Status: AC
  Administered 2013-06-12: 25 mg via ORAL

## 2013-06-12 MED ORDER — DEXAMETHASONE 4 MG PO TABS: 4.0000 mg | ORAL_TABLET | Freq: Every day | ORAL | Status: DC

## 2013-06-12 MED ORDER — COLCHICINE 0.6 MG PO TABS
0.6000 mg | ORAL_TABLET | Freq: Two times a day (BID) | ORAL | Status: DC
  Filled 2013-06-12 (×2): qty 1

## 2013-06-12 MED ORDER — FAMOTIDINE 20 MG PO TABS: 20.0000 mg | ORAL_TABLET | Freq: Two times a day (BID) | ORAL | Status: DC

## 2013-06-12 MED ORDER — SODIUM CHLORIDE 0.9 % IV SOLN
375.0000 mg/m2 | Freq: Once | INTRAVENOUS | Status: AC
  Administered 2013-06-12: 900 mg via INTRAVENOUS
  Filled 2013-06-12: qty 90

## 2013-06-12 MED ORDER — ACETAMINOPHEN 325 MG PO TABS
650.0000 mg | ORAL_TABLET | Freq: Once | ORAL | Status: AC
  Administered 2013-06-12: 650 mg via ORAL

## 2013-06-12 NOTE — Telephone Encounter (Signed)
Per [place] please call pt to schedule 3 weeks with PA

## 2013-06-12 NOTE — Progress Notes (Signed)
[facility]  Telephone:([phone]) [phone] Fax:([phone]) [phone]  OFFICE PROGRESS NOTE   ID: Bailey Sheppard   DOB: 1981-04-13  MR#: [id number]  HKV#:[id number]   PCP: Bailey Morales, MD   HISTORY OF PRESENT ILLNESS: Bailey Sheppard is a  [facility] Sheppard woman who moved from [place] with a documented history of ITP.  She was first diagnosed with ITP by a hematologist in [place] in 2007.  At that [facility] she had a history of cytopenia with associated heavy menstrual bleeding.  She presented to our ED in 02/2012 with an unusual headache and bruises on different areas of her body.  She was seen by Dr. 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 The patient stated while living in [place], her blood counts had been up and down but she only received one course of high-dose prednisone in the prior 6 years. When she had been treated with oral steroids, she responded to the treatment.  Dr. Donnie Sheppard also noted absent iron stores. Her iron deficiency had been treated with IV iron multiple times.  She moved back to [place] in 2010,  but had not sought medical attention before presenting to the ED in 02/2012.   Dr. Donnie Sheppard treated Bailey Sheppard with a slow Prednisone taper for relapsed ITP.  She had been off steroids since 04/2012 and was being observed. Her platelet counts had also normalized at this [facility] to 200,000s. She has a history of having  heavy menses but her menses have been scant if any since having an IUD placed in 04/2012.  Bailey Sheppard called the office of the Cancer Center on 06/08/2013 reporting new areas of bruising in all extremities, especially in thighs, gum bleeding when brushing her teeth, and blood clots from the left nostril. CBC results obtained at the [facility] revealed a platelet count of 5000.  Further evaluation at the ED was recommended.  Bailey Sheppard was admitted to the hospital from  06/08/2013 - 06/12/2013 and was diagnosed primarily with relapsed ITP and acute pericarditis.  While hospitalized, the patient received intravenous Dexamethasone and IVIG.  Her platelet counts have normalized to 259,000 currently.   INTERVAL HISTORY: Bailey Sheppard was seen today immediately after being discharged from the hospital and prior to her first chemotherapy infusion of Rituxan for ITP.   The patient will receive chemotherapy education in the infusion Center today prior to her chemotherapy infusion.  The patient was asked to report any unusual signs or symptoms after her infusion including any unusual bleeding immediately.    REVIEW OF SYSTEMS: A 10 point review of systems was completed and is negative except for fatigue.  The patient denies any other symptomatology.   PAST MEDICAL HISTORY: Past Medical History  Diagnosis Date  . ITP (idiopathic thrombocytopenic purpura)   . Fibroids     PAST SURGICAL HISTORY: Past Surgical History  Procedure Laterality Date  . Umbilical hernia repair    . Intrauterine device (iud) insertion  04/2012    FAMILY HISTORY Family History  Problem Relation Age of Onset  . Hypertension Mother   . Diabetes Father   . Lupus Cousin     GYNECOLOGIC HISTORY:   Menarche age 43, she is GX P2.  She has an IUD in place.   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 The patient is in a stable relationship (12 years) with Bailey Sheppard, who works as a Production designer, theatre/television/film for [employer].  They have 2 sons, aged 62 and 3    ADVANCED DIRECTIVES:  Not on file  HEALTH MAINTENANCE: History  Substance Use Topics  . Smoking status: Never Smoker   . Smokeless tobacco: Never Used  . Alcohol Use: Yes     Comment: Rare    Colonoscopy: N/A PAP:  03/2012 Bone density:  N/A Lipid panel:  Not on file  No Known Allergies  Current Outpatient Prescriptions  Medication Sig Dispense Refill  . acetaminophen (TYLENOL) 500 MG tablet Take  500 mg by mouth every 6 (six) hours as needed for pain.      Marland Kitchen colchicine 0.6 MG tablet Take 1 tablet (0.6 mg total) by mouth 2 (two) times daily.  60 tablet  0  . dexamethasone (DECADRON) 4 MG tablet Take 1 tablet (4 mg total) by mouth daily with breakfast.  30 tablet  0  . famotidine (PEPCID) 20 MG tablet Take 1 tablet (20 mg total) by mouth 2 (two) times daily.      . traMADol (ULTRAM) 50 MG tablet Take 1 tablet (50 mg total) by mouth every 6 (six) hours as needed.  30 tablet  0   No current facility-administered medications for this visit.   Facility-Administered Medications Ordered in Other Visits  Medication Dose Route Frequency Provider Last Rate Last Dose  . 0.9 %  sodium chloride infusion   Intravenous Continuous Catarina Hartshorn, MD      . acetaminophen (TYLENOL) tablet 325-650 mg  325-650 mg Oral Q6H PRN Leroy Sea, MD   650 mg at 06/09/13 1129  . albuterol (PROVENTIL) (5 MG/ML) 0.5% nebulizer solution 2.5 mg  2.5 mg Nebulization Q2H PRN Leroy Sea, MD      . colchicine tablet 0.6 mg  0.6 mg Oral BID Catarina Hartshorn, MD      . dexamethasone (DECADRON) tablet 4 mg  4 mg Oral Q breakfast Lowella Dell, MD   4 mg at 06/12/13 0826  . famotidine (PEPCID) tablet 20 mg  20 mg Oral BID Catarina Hartshorn, MD   20 mg at 06/11/13 2141  . guaiFENesin-dextromethorphan (ROBITUSSIN DM) 100-10 MG/5ML syrup 5 mL  5 mL Oral Q4H PRN Leroy Sea, MD      . insulin aspart (novoLOG) injection 0-5 Units  0-5 Units Subcutaneous QHS Elwyn Lade Albon, MD      . insulin aspart (novoLOG) injection 0-9 Units  0-9 Units Subcutaneous TID WC Roxine Caddy, MD   1 Units at 06/11/13 1732  . ondansetron (ZOFRAN) tablet 4 mg  4 mg Oral Q6H PRN Leroy Sea, MD       Or  . ondansetron (ZOFRAN) injection 4 mg  4 mg Intravenous Q6H PRN Leroy Sea, MD      . polyethylene glycol (MIRALAX / GLYCOLAX) packet 17 g  17 g Oral Daily Lowella Dell, MD   17 g at 06/11/13 0924  . sodium chloride 0.9 % injection 3 mL  3 mL  Intravenous Q12H Leroy Sea, MD   3 mL at 06/11/13 2145  . traMADol (ULTRAM) tablet 50 mg  50 mg Oral Q6H PRN Lowella Dell, MD   50 mg at 06/11/13 0023    OBJECTIVE: Filed Vitals:   06/12/13 1100  BP: 128/80  Pulse: 82  Temp: 98.6 F (37 C)  Resp: 20     Body mass index is 37.56 kg/(m^2).      ECOG FS: 1 - Symptomatic but completely ambulatory  General  appearance: Alert, cooperative, well nourished, no apparent distress Head: Normocephalic, without obvious abnormality, atraumatic Eyes: Injected sclerae, PERRLA, EOMI Nose: Nares, septum and mucosa are normal, no drainage or sinus tenderness Neck: No adenopathy, supple, symmetrical, trachea midline, thyroid not enlarged, no tenderness Resp: Clear to auscultation bilaterally Cardio: Regular rate and rhythm, S1, S2 normal, no murmur, click, rub or gallop Breasts: Deferred GI: Soft, distended, non-tender, hypoactive bowel sounds, no organomegaly Extremities: Extremities normal, atraumatic, no cyanosis or edema Lymph nodes: Cervical, supraclavicular, and axillary nodes normal Neurologic: Grossly normal    LAB RESULTS: Lab Results  Component Value Date   WBC 12.8* 06/12/2013   NEUTROABS 9.3* 06/12/2013   HGB 11.6 06/12/2013   HCT 33.9* 06/12/2013   MCV 82.9 06/12/2013   PLT 259 06/12/2013      Chemistry      Component Value Date/[facility]   NA 133* 06/11/2013 0354   K 3.7 06/11/2013 0354   CL 101 06/11/2013 0354   CO2 28 06/11/2013 0354   BUN 13 06/11/2013 0354   CREATININE 0.63 06/11/2013 0354      Component Value Date/[facility]   CALCIUM 8.4 06/11/2013 0354   ALKPHOS 67 06/08/2013 1315   AST 45* 06/08/2013 1315   ALT 35 06/08/2013 1315   BILITOT 0.9 06/08/2013 1315      Urinalysis    Component Value Date/[facility]   LABSPEC 1.015 01/17/2011 1121   PHURINE 6.5 01/17/2011 1121   HGBUR LARGE* 01/17/2011 1121   BILIRUBINUR NEGATIVE 01/17/2011 1121   KETONESUR NEGATIVE 01/17/2011 1121   PROTEINUR NEGATIVE 01/17/2011 1121   UROBILINOGEN  0.2 01/17/2011 1121   NITRITE NEGATIVE 01/17/2011 1121   LEUKOCYTESUR LARGE Biochemical Testing Only. Please order routine urinalysis from main lab if confirmatory testing is needed.* 01/17/2011 1121    STUDIES: Dg Chest 2 View 06/10/2013   *RADIOLOGY REPORT*  Clinical Data: Shortness of breath, chest pain, history of ITP  CHEST - 2 VIEW  Comparison: None.  Findings: Borderline heart size but normal vascularity.  Negative for CHF, pneumonia, collapse, consolidation, effusion or pneumothorax.  Trachea midline.  Mildly prominent right paratracheal density could represent a vascular shadow.  Difficult to completely exclude paratracheal adenopathy.  Trachea is midline.  IMPRESSION: Borderline cardiac enlargement without CHF or pneumonia.  Prominent right paratracheal density may represent vascular shadow versus adenopathy.   Original Report Authenticated By: Judie Petit. Miles Costain, M.D.    Ct Head Wo Contrast 06/08/2013   *RADIOLOGY REPORT*  Clinical Data: Right frontal headache.  Dizziness. Thrombocytopenia.  CT HEAD WITHOUT CONTRAST  Technique:  Contiguous axial images were obtained from the base of the skull through the vertex without contrast.  Comparison: Head CT 02/20/2012  Findings: The brain has a normal appearance without evidence of malformation, atrophy, old or acute infarction, mass lesion, hemorrhage, hydrocephalus or extra-axial collection.  The calvarium is unremarkable.  Sinuses, middle ears and mastoids are clear.  IMPRESSION: Normal head CT   Original Report Authenticated By: Paulina Fusi, M.D.    ASSESSMENT: 32 y.o. [place], Shepp[place] Sheppard woman: 1.  ITP  2. History of iron deficiency anemia 3. Acute pericarditis 4. Steroid-induced leukocytosis and hyperglycemia    PLAN: Ms. Manthei recently received 4 doses of Dexamethasone and 2 doses of IVIG while hospitalized.  She will be continuing Dexamethasone 4 mg by mouth daily.  She will start outpatient chemotherapy with Rituxan today with a slow  taper of her Dexamethasone for ITP.  The patient was asked to report any unusual bleeding or any unusual signs and/or  symptoms immediately.    The patient's last dose of IV Feraheme 510 mg was on 02/29/2012.  We will continue to monitor her iron deficiency anemia.  Acute pericarditis is being followed by Cardiologist Bryan Lemma, and the patient has an appointment to see him in 2 weeks.   The patient is currently taking Colchicine 0.6 mg by mouth twice a day and Famotidine 20 mg by mouth twice a day for acute pericarditis.  The patient had hyperglycemia and leukocytosis secondary to steroid therapy while hospitalized.  We will continue to monitor.   We plan to see Bailey Sheppard weekly for the next 4 weeks for laboratories and a Rituxan infusion.  We will check a CBC and CMP weekly.   All questions were answered.  The patient was encouraged to contact us in the interim with any problems, questions or concerns.   Larina Bras, NP-C 06/12/2013, 2:36 PM

## 2013-06-12 NOTE — Patient Instructions (Addendum)
Please contact us at [phone] if you have any questions or concerns.  Please continue to do well and enjoy life!!!  Get plenty of rest, drink plenty of water, exercise daily (walking), eat a balanced diet.  Report any unusual/uncontrolled bleeding  Avoid contact sports/activities

## 2013-06-12 NOTE — Progress Notes (Signed)
Patient tolerated Rituxan today. Patient had no complaints. Vital signs stable throughout treatment.

## 2013-06-12 NOTE — Patient Instructions (Addendum)
[facility] Discharge Instructions for Patients Receiving Chemotherapy  Today you received the following chemotherapy agents Rituxan.  To help prevent nausea and vomiting after your treatment, we encourage you to take your nausea medication as prescribed.   If you develop nausea and vomiting that is not controlled by your nausea medication, call the clinic.   BELOW ARE SYMPTOMS THAT SHOULD BE REPORTED IMMEDIATELY:  *FEVER GREATER THAN 100.5 F  *CHILLS WITH OR WITHOUT FEVER  NAUSEA AND VOMITING THAT IS NOT CONTROLLED WITH YOUR NAUSEA MEDICATION  *UNUSUAL SHORTNESS OF BREATH  *UNUSUAL BRUISING OR BLEEDING  TENDERNESS IN MOUTH AND THROAT WITH OR WITHOUT PRESENCE OF ULCERS  *URINARY PROBLEMS  *BOWEL PROBLEMS  UNUSUAL RASH Items with * indicate a potential emergency and should be followed up as soon as possible.  Feel free to call the clinic you have any questions or concerns. The clinic phone number is [phone].    

## 2013-06-12 NOTE — Discharge Summary (Signed)
Physician Discharge Summary  Bailey Sheppard NWG:[id number] DOB: 01/23/1981 DOA: 06/08/2013  PCP: Hal Morales, MD  Admit date: 06/08/2013 Discharge date: 06/12/2013  Recommendations for Outpatient Follow-up:  1. Pt will need to follow up with PCP in 2 weeks post discharge 2. Please obtain BMP to evaluate electrolytes and kidney function 3. Please also check CBC to evaluate Hg and Hct levels 4. Followup with Dr. Darnelle Catalan in one week 5. Follow up with Cardiology, Dr. Bryan Lemma in 2 weeks  Discharge Diagnoses:  Active Problems:   ITP (idiopathic thrombocytopenic purpura)   Iron deficiency anemia   Hyponatremia   Chest pain   Hyperglycemia   Nonspecific abnormal electrocardiogram (ECG) (EKG)   Acute pericarditis, unspecified Idiopathic thrombocytopenia purpura  -Patient has received 4 doses of dexamethasone, 2 doses IVIG  -Platelets are recovering  -No active signs of bleeding  -Continue dexamethasone 4mg  daily -appreciate Dr. Darnelle Catalan -Discussed case with Dr. Charline Bills with colchicine -pepcid while on steroids  Acute Pericarditis -EKG with ST elevation V3-V6 without reciprocal PR depression--> concerned about pericarditis versus early repolarization  -Cardiology, Dr. Herbie Baltimore was consulted whom confirmed diagnosis of acute pericarditis -Plan to discharge patient on dexamethasone 4 mg daily (her current ITP dosing) with colchicine 0.6 mg twice a day -Patient's chest discomfort gradually improving on day of discharge -Echocardiogram 06/11/2013--EF 55-60%, no wall motion and mild -troponins--negative x3  -Chest x-ray--borderline cardiomegaly without infiltrate  -consult cardiology  -D-Dimer negative x 2 this admission   History of iron deficiency anemia  -Hemoglobin is stable  Leukocytosis  -Likely due to steroids  Hyperglycemia  -Likely due to steroids  Hyponatremia  -Recheck BMP--improved with normal saline and remained stable  Hospital Course:  32 year old  female with history ITP presents to the hospital on 06/08/2013 with a few day history of nontraumatic bruising on her legs, bleeding gums. Labs in the emergency department revealed a platelet count less than 5000. Hematology, Dr. Darnelle Catalan, was consulted. The patient was started on intravenous dexamethasone and IVIG. Platelets counts have improved. The patient was weaned to oral dexamethasone. During admission, the patient endorsed a two-week history of right-sided and substernal chest discomfort. She stated that she has tried Tums in the past without much relief. The chest discomfort is elicited when the patient lies supine and relieved when the patient sits up. In addition, the patient also endorses a history of decreasing endurance with some dyspnea on exertion. There was no fevers, chills, recent viral illness. EKG showed ST elevation in V3 to V6 suggestive of pericarditis versus early repolarization. Troponins are negative x3. The patient has remained hemodynamically stable. Cardiology was consulted whom agreed with acute pericarditis. The patient was started on colchicine 0.6 mg twice a day. After discussion with Dr. Darnelle Catalan. She was continued on dexamethasone 4 mg daily. The patient will continue on colchicine for 2 weeks. She has a followup with cardiology in the office in 2 weeks.   Discharge Condition: Stable  Disposition:  Follow-up Information   Follow up with HARDING,Deneshia Zucker W, MD In 2 weeks.   Contact information:   [address] [place] [address] [phone]      discharge home  Diet: Regular diet Wt Readings from Last 3 Encounters:  06/12/13 118.706 kg (261 lb 11.2 oz)  12/16/12 111.812 kg (246 lb 8 oz)  07/15/12 115.032 kg (253 lb 9.6 oz)       Consultants: Cardiology--Dr. Herbie Baltimore Hematology--Dr. Magrinat  Discharge Exam: Filed Vitals:   06/12/13 0514  BP: 128/70  Pulse: 71  Temp: 98.1 F (36.7 C)  Resp: 18   Filed Vitals:   06/11/13 0514  06/11/13 1300 06/11/13 2110 06/12/13 0514  BP: 121/68 124/80 111/66 128/70  Pulse: 62 66 65 71  Temp: 99 F (37.2 C) 98.4 F (36.9 C) 98.2 F (36.8 C) 98.1 F (36.7 C)  TempSrc: Oral Oral Oral Oral  Resp: 18 20 18 18   Height:      Weight: 117.708 kg (259 lb 8 oz)   118.706 kg (261 lb 11.2 oz)  SpO2: 98% 99% 97% 97%   General: A&O x 3, NAD, pleasant, cooperative Cardiovascular: RRR,  no gallop, no S3 Respiratory: CTAB, no wheeze, no rhonchi Abdomen:soft, nontender, nondistended, positive bowel sounds Extremities: No edema, No lymphangitis, no petechiae  Discharge Instructions      Discharge Orders   Future Appointments Provider Department Dept Phone   06/12/2013 11:00 AM [facility] [place] [facility] [phone]   06/19/2013 8:30 AM Beverely Pace [facility] [phone]   06/19/2013 9:00 AM Keitha Butte, NP [place] [facility] [phone]   06/19/2013 11:00 AM [facility] [phone]   06/26/2013 8:30 AM Dava Najjar [facility] [phone]   06/26/2013 9:00 AM Keitha Butte, NP [facility] [phone]   06/26/2013 11:00 AM [facility] [place] [facility] [phone]   07/10/2013 8:30 AM Krista Blue [facility] [phone]   07/10/2013 9:00 AM Keitha Butte, NP [place] [facility] [phone]   07/10/2013 11:00 AM [facility] [place] [facility] [phone]   Future Orders Complete By Expires     Diet - low sodium heart healthy  As directed     Increase activity slowly  As directed         Medication List    TAKE these medications       acetaminophen 500 MG tablet  Commonly known as:  TYLENOL  Take 500 mg by mouth every 6 (six) hours as needed  for pain.     colchicine 0.6 MG tablet  Take 1 tablet (0.6 mg total) by mouth 2 (two) times daily.     dexamethasone 4 MG tablet  Commonly known as:  DECADRON  Take 1 tablet (4 mg total) by mouth daily with breakfast.     famotidine 20 MG tablet  Commonly known as:  PEPCID  Take 1 tablet (20 mg total) by mouth 2 (two) times daily.     traMADol 50 MG tablet  Commonly known as:  ULTRAM  Take 1 tablet (50 mg total) by mouth every 6 (six) hours as needed.         The results of significant diagnostics from this hospitalization (including imaging, microbiology, ancillary and laboratory) are listed below for reference.    Significant Diagnostic Studies: Dg Chest 2 View  06/10/2013   *RADIOLOGY REPORT*  Clinical Data: Shortness of breath, chest pain, history of ITP  CHEST - 2 VIEW  Comparison: None.  Findings: Borderline heart size but normal vascularity.  Negative for CHF, pneumonia, collapse, consolidation, effusion or pneumothorax.  Trachea midline.  Mildly prominent right paratracheal density could represent a vascular shadow.  Difficult to completely exclude paratracheal adenopathy.  Trachea is midline.  IMPRESSION: Borderline cardiac enlargement without CHF or pneumonia.  Prominent right paratracheal density may represent vascular shadow versus adenopathy.  Original Report Authenticated By: Judie Petit. Miles Costain, M.D.   Ct Head Wo Contrast  06/08/2013   *RADIOLOGY REPORT*  Clinical Data: Right frontal headache.  Dizziness. Thrombocytopenia.  CT HEAD WITHOUT CONTRAST  Technique:  Contiguous axial images were obtained from the base of the skull through the vertex without contrast.  Comparison: Head CT 02/20/2012  Findings: The brain has a normal appearance without evidence of malformation, atrophy, old or acute infarction, mass lesion, hemorrhage, hydrocephalus or extra-axial collection.  The calvarium is unremarkable.  Sinuses, middle ears and mastoids are clear.  IMPRESSION: Normal head CT   Original  Report Authenticated By: Paulina Fusi, M.D.     Microbiology: Recent Results (from the past 240 hour(s))  MRSA PCR SCREENING     Status: None   Collection Time    06/08/13  4:43 PM      Result Value Range Status   MRSA by PCR NEGATIVE  NEGATIVE Final   Comment:            The GeneXpert MRSA Assay (FDA     approved for NASAL specimens     only), is one component of a     comprehensive MRSA colonization     surveillance program. It is not     intended to diagnose MRSA     infection nor to guide or     monitor treatment for     MRSA infections.     Labs: Basic Metabolic Panel:  Recent Labs Lab 06/08/13 1315 06/09/13 0330 06/11/13 0354  NA 136 129* 133*  K 3.6 3.8 3.7  CL 101 99 101  CO2 26 22 28   GLUCOSE 90 242* 125*  BUN 5* 11 13  CREATININE 0.68 0.73 0.63  CALCIUM 9.4 9.2 8.4  MG  --   --  2.0   Liver Function Tests:  Recent Labs Lab 06/08/13 1315  AST 45*  ALT 35  ALKPHOS 67  BILITOT 0.9  PROT 7.2  ALBUMIN 3.9   No results found for this basename: LIPASE, AMYLASE,  in the last 168 hours No results found for this basename: AMMONIA,  in the last 168 hours CBC:  Recent Labs Lab 06/08/13 1031 06/08/13 1315 06/08/13 1710 06/09/13 0330 06/10/13 0353 06/11/13 0354  WBC 8.7 7.7  --  8.3 13.3* 12.0*  NEUTROABS 6.5 5.3  --   --   --   --   HGB 12.5 12.7  --  11.9* 11.1* 10.7*  HCT 36.3 36.2  --  34.1* 32.3* 30.5*  MCV 80.7 79.9  --  81.4 84.6 83.3  PLT 5* <5* <5* 13*  13* 81* 159   Cardiac Enzymes:  Recent Labs Lab 06/10/13 1530 06/10/13 2052 06/11/13 0354  TROPONINI <0.30 <0.30 <0.30   BNP: No components found with this basename: POCBNP,  CBG:  Recent Labs Lab 06/11/13 0726 06/11/13 1118 06/11/13 1638 06/11/13 2108 06/12/13 0706  GLUCAP 103* 135* 136* 124* 97    Time coordinating discharge:  Greater than 30 minutes  Signed:  Giavonna Pflum, DO [facility] Pager: [phone] 06/12/2013, 8:40 AM

## 2013-06-13 LAB — HEPATITIS B SURFACE ANTIGEN: Hepatitis B Surface Ag: NEGATIVE

## 2013-06-15 ENCOUNTER — Telehealth: Payer: Self-pay | Admitting: *Deleted

## 2013-06-15 NOTE — Telephone Encounter (Signed)
Patient is doing well.  Denies any symptoms.  Says she is drinking lots of water because she notices she has a headache if she isn't drinking enough.  Denies questions.  Next f/u is Friday, 06-19-2013.

## 2013-06-15 NOTE — Telephone Encounter (Signed)
Message copied by Augusto Garbe on Mon Jun 15, 2013  3:29 PM ------      Message from: Laroy Apple E      Created: Sat Jun 13, 2013  1:21 PM      Regarding: chemo follow up       First Rituxan ------

## 2013-06-19 ENCOUNTER — Ambulatory Visit: Payer: BC Managed Care – PPO | Admitting: Oncology

## 2013-06-19 ENCOUNTER — Other Ambulatory Visit (HOSPITAL_BASED_OUTPATIENT_CLINIC_OR_DEPARTMENT_OTHER): Payer: BC Managed Care – PPO | Admitting: Lab

## 2013-06-19 ENCOUNTER — Encounter: Payer: Self-pay | Admitting: Oncology

## 2013-06-19 ENCOUNTER — Ambulatory Visit (HOSPITAL_BASED_OUTPATIENT_CLINIC_OR_DEPARTMENT_OTHER): Payer: BC Managed Care – PPO | Admitting: Lab

## 2013-06-19 ENCOUNTER — Encounter: Payer: Self-pay | Admitting: Family

## 2013-06-19 ENCOUNTER — Ambulatory Visit (HOSPITAL_BASED_OUTPATIENT_CLINIC_OR_DEPARTMENT_OTHER): Payer: BC Managed Care – PPO | Admitting: Family

## 2013-06-19 ENCOUNTER — Telehealth: Payer: Self-pay | Admitting: Family

## 2013-06-19 ENCOUNTER — Ambulatory Visit (HOSPITAL_BASED_OUTPATIENT_CLINIC_OR_DEPARTMENT_OTHER): Payer: BC Managed Care – PPO

## 2013-06-19 ENCOUNTER — Telehealth: Payer: Self-pay | Admitting: Oncology

## 2013-06-19 VITALS — BP 117/72 | HR 71 | Temp 98.0°F | Resp 18

## 2013-06-19 VITALS — BP 129/88 | HR 69 | Temp 98.5°F | Resp 20 | Ht 70.0 in | Wt 261.8 lb

## 2013-06-19 DIAGNOSIS — R7309 Other abnormal glucose: Secondary | ICD-10-CM

## 2013-06-19 DIAGNOSIS — R300 Dysuria: Secondary | ICD-10-CM

## 2013-06-19 DIAGNOSIS — D693 Immune thrombocytopenic purpura: Secondary | ICD-10-CM

## 2013-06-19 DIAGNOSIS — Z5112 Encounter for antineoplastic immunotherapy: Secondary | ICD-10-CM

## 2013-06-19 DIAGNOSIS — D72829 Elevated white blood cell count, unspecified: Secondary | ICD-10-CM

## 2013-06-19 LAB — CBC WITH DIFFERENTIAL/PLATELET
Basophils Absolute: 0 10*3/uL (ref 0.0–0.1)
Eosinophils Absolute: 0.1 10*3/uL (ref 0.0–0.5)
HGB: 10.3 g/dL — ABNORMAL LOW (ref 11.6–15.9)
MCV: 86.1 fL (ref 79.5–101.0)
MONO%: 8.5 % (ref 0.0–14.0)
NEUT#: 9.2 10*3/uL — ABNORMAL HIGH (ref 1.5–6.5)
RDW: 18.1 % — ABNORMAL HIGH (ref 11.2–14.5)

## 2013-06-19 LAB — URINALYSIS, MICROSCOPIC - CHCC
Bilirubin (Urine): NEGATIVE
Glucose: NEGATIVE mg/dL
Nitrite: NEGATIVE

## 2013-06-19 LAB — COMPREHENSIVE METABOLIC PANEL (CC13)
Albumin: 3.3 g/dL — ABNORMAL LOW (ref 3.5–5.0)
CO2: 21 mEq/L — ABNORMAL LOW (ref 22–29)
Calcium: 9 mg/dL (ref 8.4–10.4)
Glucose: 97 mg/dl (ref 70–99)
Potassium: 3.9 mEq/L (ref 3.5–5.1)
Sodium: 136 mEq/L (ref 136–145)
Total Protein: 8.7 g/dL — ABNORMAL HIGH (ref 6.4–8.3)

## 2013-06-19 MED ORDER — ACETAMINOPHEN 325 MG PO TABS
650.0000 mg | ORAL_TABLET | Freq: Once | ORAL | Status: AC
  Administered 2013-06-19: 650 mg via ORAL

## 2013-06-19 MED ORDER — SODIUM CHLORIDE 0.9 % IV SOLN
Freq: Once | INTRAVENOUS | Status: AC
  Administered 2013-06-19: 11:00:00 via INTRAVENOUS

## 2013-06-19 MED ORDER — DIPHENHYDRAMINE HCL 25 MG PO CAPS
25.0000 mg | ORAL_CAPSULE | Freq: Once | ORAL | Status: AC
  Administered 2013-06-19: 25 mg via ORAL

## 2013-06-19 MED ORDER — SODIUM CHLORIDE 0.9 % IV SOLN
375.0000 mg/m2 | Freq: Once | INTRAVENOUS | Status: AC
  Administered 2013-06-19: 900 mg via INTRAVENOUS
  Filled 2013-06-19: qty 90

## 2013-06-19 NOTE — Telephone Encounter (Signed)
Let Bailey Sheppard know that her UA was normal.  Asked her to discuss her urinary symptoms with her PCP/GYN. The patient voiced understanding.

## 2013-06-19 NOTE — Patient Instructions (Addendum)
Please contact us at [phone] if you have any questions or concerns.  Please continue to do well and enjoy life!!!  Get plenty of rest, drink plenty of water, exercise daily (walking), eat a balanced diet.  Dexamethasone taper: Start Monday 06/22/2013 taking Dexamethasone 2 mg daily for 1 week (until 06/28/2013) Then on 06/29/2013 take Dexamethasone 2 mg every other day for 2 weeks then you may stop taking Dexamethasone.  Results for orders placed in visit on 06/19/13 (from the past 24 hour(s))  CBC WITH DIFFERENTIAL     Status: Abnormal   Collection Time    06/19/13  8:45 AM      Result Value Range   WBC 13.2 (*) 3.9 - 10.3 10e3/uL   NEUT# 9.2 (*) 1.5 - 6.5 10e3/uL   HGB 10.3 (*) 11.6 - 15.9 g/dL   HCT 40.9 (*) 81.1 - 91.4 %   Platelets 460 (*) 145 - 400 10e3/uL   MCV 86.1  79.5 - 101.0 fL   MCH 29.2  25.1 - 34.0 pg   MCHC 33.9  31.5 - 36.0 g/dL   RBC 7.82 (*) 9.56 - 2.13 10e6/uL   RDW 18.1 (*) 11.2 - 14.5 %   lymph# 2.7  0.9 - 3.3 10e3/uL   MONO# 1.1 (*) 0.1 - 0.9 10e3/uL   Eosinophils Absolute 0.1  0.0 - 0.5 10e3/uL   Basophils Absolute 0.0  0.0 - 0.1 10e3/uL   NEUT% 69.6  38.4 - 76.8 %   LYMPH% 20.7  14.0 - 49.7 %   MONO% 8.5  0.0 - 14.0 %   EOS% 1.0  0.0 - 7.0 %   BASO% 0.2  0.0 - 2.0 %   nRBC 1 (*) 0 - 0 %   Narrative:    Performed At:  [facility]               [address]               [place], [place] [address]

## 2013-06-19 NOTE — Progress Notes (Addendum)
[facility]  [phone] [phone]  OFFICE PROGRESS NOTE   ID: Bailey Sheppard   DOB: [birth year]  MR#: [id number]  ION#:[id number]   PCP: Hal Morales, MD   HISTORY OF PRESENT ILLNESS: [name] is a  [place], [name] who moved from [place] with a documented history of ITP.  She was first diagnosed with ITP by a hematologist in [place] in 2007.  At that time she had a history of cytopenia with associated heavy menstrual bleeding.  She presented to our ED in 02/2012 with an unusual headache and bruises on different areas of her body.  She was seen by Dr. 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 The patient stated while living in [place], her blood counts had been up and down but she only received one course of high-dose prednisone in the prior 6 years. When she had been treated with oral steroids, she responded to the treatment.  Dr. Donnie Coffin also noted absent iron stores. Her iron deficiency had been treated with IV iron multiple times.  She moved back to [place] in 2010,  but had not sought medical attention before presenting to the ED in 02/2012.   Dr. Donnie Coffin treated [name] with a slow Prednisone taper for relapsed ITP.  She had been off steroids since 04/2012 and was being observed. Her platelet counts had normalized. She has a history of having  heavy menses but her menses have been scant if any since having an IUD placed in 04/2012.  [name] called the office of the Cancer Center on 06/08/2013 reporting new areas of bruising in all extremities, especially in thighs, gum bleeding when brushing her teeth, and blood clots from the left nostril. CBC results obtained at the [facility] revealed a platelet count of 5000.  Further evaluation at the ED was recommended.  [name] was admitted to the hospital from 06/08/2013 - 06/12/2013 and was  diagnosed with relapsed ITP and acute pericarditis.  While hospitalized, the patient received intravenous Dexamethasone and IVIG.  Her platelet counts had normalized to 259,000 following hospitalization.   INTERVAL HISTORY: Dr. Darnelle Catalan and I saw [name] today for followup of ITP.  She was last seen by [place] on 06/12/2013 when she received her first Rituxan infusion.  Since her first infusion of Rituxan she has been doing relatively well.  The patient was asked to report any unusual signs or symptoms after her infusions including any unusual bleeding immediately.    REVIEW OF SYSTEMS: A 10 point review of systems was completed and is negative except for headaches following the infusion, that have since resolved.  The patient also reports having thick phlegm production 2 days after her Rituxan infusion, but her symptoms have improved.  The patient states her chest pain from pericarditis has also improved.  Ms. Uriarte also reports lower left back tenderness in addition to a numb feeling when urinating and lower mouth numbing.  Dr. Darnelle Catalan does not believe her symptoms are as result of Rituxan infusion.  The patient denies any other symptomatology.   PAST MEDICAL HISTORY: Past Medical History  Diagnosis Date  . ITP (idiopathic thrombocytopenic purpura)   . Fibroids     PAST SURGICAL HISTORY: Past Surgical History  Procedure Laterality Date  . Umbilical hernia repair    . Intrauterine device (iud) insertion  04/2012    FAMILY HISTORY Family History  Problem Relation Age of Onset  .  Hypertension Mother   . Diabetes Father   . Lupus Cousin     GYNECOLOGIC HISTORY:   Menarche age 89, she is GX P2.  She has an IUD in place.   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 The patient is in a stable relationship (12 years) with Bailey Sheppard, who works as a Production designer, theatre/television/film for [employer]. They have 2 sons, aged 23 and 3    ADVANCED  DIRECTIVES:  Not on file  HEALTH MAINTENANCE: History  Substance Use Topics  . Smoking status: Never Smoker   . Smokeless tobacco: Never Used  . Alcohol Use: Yes     Comment: Rare    Colonoscopy: N/A PAP:  03/2012 Bone density:  N/A Lipid panel:  Not on file  No Known Allergies  Current Outpatient Prescriptions  Medication Sig Dispense Refill  . acetaminophen (TYLENOL) 500 MG tablet Take 500 mg by mouth every 6 (six) hours as needed for pain.      Marland Kitchen colchicine 0.6 MG tablet Take 1 tablet (0.6 mg total) by mouth 2 (two) times daily.  60 tablet  0  . dexamethasone (DECADRON) 4 MG tablet Take 1 tablet (4 mg total) by mouth daily with breakfast.  30 tablet  0  . famotidine (PEPCID) 20 MG tablet Take 1 tablet (20 mg total) by mouth 2 (two) times daily.      . traMADol (ULTRAM) 50 MG tablet Take 1 tablet (50 mg total) by mouth every 6 (six) hours as needed.  30 tablet  0   No current facility-administered medications for this visit.    OBJECTIVE: Filed Vitals:   06/19/13 0914  BP: 129/88  Pulse: 69  Temp: 98.5 F (36.9 C)  Resp: 20     Body mass index is 37.56 kg/(m^2).      ECOG FS: 1 - Symptomatic but completely ambulatory  General appearance: Alert, cooperative, well nourished, no apparent distress Head: Normocephalic, without obvious abnormality, atraumatic Eyes: Conjunctivae/corneas clear, PERRLA, EOMI Nose: Nares, septum and mucosa are normal, no drainage or sinus tenderness Neck: No adenopathy, supple, symmetrical, trachea midline, thyroid not enlarged, no tenderness Resp: Clear to auscultation bilaterally Cardio: Regular rate and rhythm, S1, S2 normal, no murmur, click, rub or gallop GI: Soft, distended, non-tender, hypoactive bowel sounds, no organomegaly Extremities: Extremities normal, atraumatic, no cyanosis or edema M/S:  Left lower back tenderness Lymph nodes: Cervical, supraclavicular, and axillary nodes normal Neurologic: Grossly normal    LAB  RESULTS: Lab Results  Component Value Date   WBC 13.2* 06/19/2013   NEUTROABS 9.2* 06/19/2013   HGB 10.3* 06/19/2013   HCT 30.4* 06/19/2013   MCV 86.1 06/19/2013   PLT 460* 06/19/2013      Chemistry      Component Value Date/Time   NA 133* 06/11/2013 0354   K 3.7 06/11/2013 0354   CL 101 06/11/2013 0354   CO2 28 06/11/2013 0354   BUN 13 06/11/2013 0354   CREATININE 0.63 06/11/2013 0354      Component Value Date/Time   CALCIUM 8.4 06/11/2013 0354   ALKPHOS 67 06/08/2013 1315   AST 45* 06/08/2013 1315   ALT 35 06/08/2013 1315   BILITOT 0.9 06/08/2013 1315      Urinalysis    Component Value Date/Time   LABSPEC 1.015 01/17/2011 1121   PHURINE 6.5 01/17/2011 1121   HGBUR LARGE* 01/17/2011 1121   BILIRUBINUR NEGATIVE 01/17/2011 1121   KETONESUR NEGATIVE 01/17/2011 1121   PROTEINUR NEGATIVE  01/17/2011 1121   UROBILINOGEN 0.2 01/17/2011 1121   NITRITE NEGATIVE 01/17/2011 1121   LEUKOCYTESUR LARGE Biochemical Testing Only. Please order routine urinalysis from main lab if confirmatory testing is needed.* 01/17/2011 1121    STUDIES: No results found.   ASSESSMENT: 32 y.o. [place], [place]: 1.  ITP  2. History of iron deficiency anemia 3. Acute pericarditis 4. Steroid-induced leukocytosis and hyperglycemia 5. Dysuria    PLAN: She will be continuing Dexamethasone 4 mg by mouth daily until 06/21/2013.  On Monday 06/22/2013 she will begin a Dexamethasone taper.  She will take Dexamethasone 2 mg for one week (until 06/28/2013).  Then she will start taking Dexamethasone 2 mg every other day for 2 weeks.  She will stop taking Dexamethasone at that time (around 07/12/2013).   She is receiving outpatient chemotherapy with Rituxan weekly for 5 cycles (starting on 06/12/2013) (she has a week off of therapy during Fourth of July Holiday).  We will then continue Rituxan therapy for an additional 2 cycles every other week (on 07/31/2013 and 08/21/2013 respectively).  The patient was asked to  report any unusual bleeding or any unusual signs and/or symptoms immediately.    The patient's last dose of IV Feraheme 510 mg was on 02/29/2012.  We will continue to monitor her iron deficiency anemia.  Acute pericarditis is being followed by Cardiologist Bryan Lemma, and the patient has an appointment to see him on 07/02/2013.   The patient is currently taking Colchicine 0.6 mg by mouth twice a day and Famotidine 20 mg by mouth twice a day for acute pericarditis.  The patient had hyperglycemia and leukocytosis secondary to steroid therapy while hospitalized.  We will continue to monitor.   We will be ordering a UA and Urine culture today to evaluate the patient's urinary complaints.    We plan to see Mrs. Garr in one week for laboratories and a Rituxan infusion.  We will check a CBC and CMP at that time.   All questions were answered.  The patient was encouraged to contact us in the interim with any problems, questions or concerns.   Larina Bras, NP-C 06/19/2013, 11:31 AM   ADDENDUM: Antric is having a variety of symptoms that are really I don't think directly related to her ITP. Today I strongly encouraged her to continue the right toxin as scheduled, namely weekly x5, then every 2 weeks x2, and then perhaps one more month later. Her IVIG will be metabolized within the next 2-3 weeks and therefore its effect on her platelets will no longer be apparent. We are also decreasing her Decadron as of today to 2 mg daily. Therefore within 3-4 weeks we will know whether the right toxin is holding her platelet count up or whether she will need other interventions, possibly including splenectomy.  All this was discussed at length with the patient, who has a good understanding of her situation and agrees to proceed with the current plan.   I personally saw this patient and performed a substantive portion of this encounter with the listed APP documented above.   Lowella Dell,  MD

## 2013-06-19 NOTE — Patient Instructions (Addendum)
[facility] Discharge Instructions for Patients Receiving Chemotherapy  Today you received the following chemotherapy agents Rituxan.  To help prevent nausea and vomiting after your treatment, we encourage you to take your nausea medication as prescribed.   If you develop nausea and vomiting that is not controlled by your nausea medication, call the clinic.   BELOW ARE SYMPTOMS THAT SHOULD BE REPORTED IMMEDIATELY:  *FEVER GREATER THAN 100.5 F  *CHILLS WITH OR WITHOUT FEVER  NAUSEA AND VOMITING THAT IS NOT CONTROLLED WITH YOUR NAUSEA MEDICATION  *UNUSUAL SHORTNESS OF BREATH  *UNUSUAL BRUISING OR BLEEDING  TENDERNESS IN MOUTH AND THROAT WITH OR WITHOUT PRESENCE OF ULCERS  *URINARY PROBLEMS  *BOWEL PROBLEMS  UNUSUAL RASH Items with * indicate a potential emergency and should be followed up as soon as possible.  Feel free to call the clinic you have any questions or concerns. The clinic phone number is [phone].    

## 2013-06-22 ENCOUNTER — Ambulatory Visit: Payer: BC Managed Care – PPO | Admitting: Lab

## 2013-06-26 ENCOUNTER — Telehealth: Payer: Self-pay | Admitting: Oncology

## 2013-06-26 ENCOUNTER — Encounter: Payer: Self-pay | Admitting: Family

## 2013-06-26 ENCOUNTER — Ambulatory Visit (HOSPITAL_BASED_OUTPATIENT_CLINIC_OR_DEPARTMENT_OTHER): Payer: BC Managed Care – PPO

## 2013-06-26 ENCOUNTER — Ambulatory Visit (HOSPITAL_BASED_OUTPATIENT_CLINIC_OR_DEPARTMENT_OTHER): Payer: BC Managed Care – PPO | Admitting: Family

## 2013-06-26 ENCOUNTER — Other Ambulatory Visit (HOSPITAL_BASED_OUTPATIENT_CLINIC_OR_DEPARTMENT_OTHER): Payer: BC Managed Care – PPO | Admitting: Lab

## 2013-06-26 VITALS — BP 132/75 | HR 88 | Temp 97.9°F | Resp 20

## 2013-06-26 VITALS — BP 126/81 | HR 94 | Temp 98.5°F | Resp 20 | Ht 70.0 in | Wt 264.2 lb

## 2013-06-26 DIAGNOSIS — D509 Iron deficiency anemia, unspecified: Secondary | ICD-10-CM

## 2013-06-26 DIAGNOSIS — R300 Dysuria: Secondary | ICD-10-CM

## 2013-06-26 DIAGNOSIS — D693 Immune thrombocytopenic purpura: Secondary | ICD-10-CM

## 2013-06-26 DIAGNOSIS — Z5112 Encounter for antineoplastic immunotherapy: Secondary | ICD-10-CM

## 2013-06-26 LAB — CBC WITH DIFFERENTIAL/PLATELET
BASO%: 0.2 % (ref 0.0–2.0)
Basophils Absolute: 0 10*3/uL (ref 0.0–0.1)
EOS%: 1.6 % (ref 0.0–7.0)
HCT: 32.7 % — ABNORMAL LOW (ref 34.8–46.6)
LYMPH%: 24.9 % (ref 14.0–49.7)
MCH: 30 pg (ref 25.1–34.0)
MCHC: 33.3 g/dL (ref 31.5–36.0)
MONO#: 0.8 10*3/uL (ref 0.1–0.9)
NEUT%: 64.9 % (ref 38.4–76.8)
Platelets: 236 10*3/uL (ref 145–400)

## 2013-06-26 LAB — COMPREHENSIVE METABOLIC PANEL (CC13)
AST: 26 U/L (ref 5–34)
BUN: 13.7 mg/dL (ref 7.0–26.0)
Calcium: 8.9 mg/dL (ref 8.4–10.4)
Chloride: 104 mEq/L (ref 98–109)
Creatinine: 0.7 mg/dL (ref 0.6–1.1)
Glucose: 75 mg/dl (ref 70–140)

## 2013-06-26 MED ORDER — DIPHENHYDRAMINE HCL 25 MG PO CAPS
25.0000 mg | ORAL_CAPSULE | Freq: Once | ORAL | Status: AC
  Administered 2013-06-26: 25 mg via ORAL

## 2013-06-26 MED ORDER — SODIUM CHLORIDE 0.9 % IV SOLN
Freq: Once | INTRAVENOUS | Status: AC
  Administered 2013-06-26: 11:00:00 via INTRAVENOUS

## 2013-06-26 MED ORDER — SODIUM CHLORIDE 0.9 % IV SOLN
375.0000 mg/m2 | Freq: Once | INTRAVENOUS | Status: AC
  Administered 2013-06-26: 900 mg via INTRAVENOUS
  Filled 2013-06-26: qty 90

## 2013-06-26 MED ORDER — ACETAMINOPHEN 325 MG PO TABS
650.0000 mg | ORAL_TABLET | Freq: Once | ORAL | Status: AC
  Administered 2013-06-26: 650 mg via ORAL

## 2013-06-26 NOTE — Progress Notes (Addendum)
[facility]  Telephone:(336) [phone] Fax:(336) [phone]  OFFICE PROGRESS NOTE   ID: Bailey Sheppard   DOB: 06-18-1981  MR#: [id number]  JYN#:[id number]   PCP: Hal Morales, MD   HISTORY OF PRESENT ILLNESS: [name] is a  [place], [name] who moved from [place] with a documented history of ITP.  She was first diagnosed with ITP by a hematologist in [place] in 2007.  At that time she had a history of cytopenia with associated heavy menstrual bleeding.  She presented to our ED in 02/2012 with an unusual headache and bruises on different areas of her body.  She was seen by Dr. 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 The patient stated while living in [place], her blood counts had been up and down but she only received one course of high-dose prednisone in the prior 6 years. When she had been treated with oral steroids, she responded to the treatment.  Dr. Donnie Coffin also noted absent iron stores. Her iron deficiency had been treated with IV iron multiple times.  She moved back to [place] in 2010,  but had not sought medical attention before presenting to the ED in 02/2012.   Dr. Donnie Coffin treated [name] with a slow Prednisone taper for relapsed ITP.  She had been off steroids since 04/2012 and was being observed. Her platelet counts had normalized. She has a history of having  heavy menses but her menses have been scant if any since having an IUD placed in 04/2012.  Bailey Sheppard called the office of the Cancer Center on 06/08/2013 reporting new areas of bruising in all extremities, especially in thighs, gum bleeding when brushing her teeth, and blood clots from the left nostril. CBC results obtained at the [facility] revealed a platelet count of 5000.  Further evaluation at the ED was recommended.  Ms. Aughenbaugh was admitted to the hospital from 06/08/2013 - 06/12/2013 and was  diagnosed with relapsed ITP and acute pericarditis.  While hospitalized, the patient received intravenous Dexamethasone and IVIG.  Her platelet counts had normalized to 259,000 following hospitalization.   INTERVAL HISTORY: Dr. Darnelle Catalan and I saw [name] today for follow up of ITP.  She was last seen by [place] on 06/19/2013 when she received her second Rituxan infusion.  She has been tolerating the Rituxan infusions relatively well.  The patient has been encouraged to report any unusual signs or symptoms after her infusions including any unusual bleeding immediately.    REVIEW OF SYSTEMS: A 10 point review of systems was completed and is negative except for upper left abdominal pain.  The patient states her chest pain from pericarditis has also improved.  The urinary symptoms she reported last week have resolved.  The patient was informed her urinalysis and urine culture were negative.  Ms. Pecore denies any other symptomatology.   PAST MEDICAL HISTORY: Past Medical History  Diagnosis Date  . ITP (idiopathic thrombocytopenic purpura)   . Fibroids     PAST SURGICAL HISTORY: Past Surgical History  Procedure Laterality Date  . Umbilical hernia repair    . Intrauterine device (iud) insertion  04/2012    FAMILY HISTORY Family History  Problem Relation Age of Onset  . Hypertension Mother   . Diabetes Father   . Lupus Cousin     GYNECOLOGIC HISTORY:   Menarche age 32, she is GX P2.  She has an IUD in place.  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 They have 2 sons, aged 19 and 3    ADVANCED DIRECTIVES:  Not on file  HEALTH MAINTENANCE: History  Substance Use Topics  . Smoking status: Never Smoker   . Smokeless tobacco: Never Used  . Alcohol Use: Yes     Comment: Rare    Colonoscopy: N/A PAP:   03/2012 Bone density:  N/A Lipid panel:  Not on file  No Known Allergies  Current Outpatient Prescriptions  Medication Sig Dispense Refill  . acetaminophen (TYLENOL) 500 MG tablet Take 500 mg by mouth every 6 (six) hours as needed for pain.      Marland Kitchen colchicine 0.6 MG tablet Take 1 tablet (0.6 mg total) by mouth 2 (two) times daily.  60 tablet  0  . dexamethasone (DECADRON) 4 MG tablet Take 2 mg by mouth daily with breakfast.      . famotidine (PEPCID) 20 MG tablet Take 1 tablet (20 mg total) by mouth 2 (two) times daily.      . traMADol (ULTRAM) 50 MG tablet Take 1 tablet (50 mg total) by mouth every 6 (six) hours as needed.  30 tablet  0   No current facility-administered medications for this visit.    OBJECTIVE: Filed Vitals:   06/26/13 0934  BP: 126/81  Pulse: 94  Temp: 98.5 F (36.9 C)  Resp: 20     Body mass index is 37.91 kg/(m^2).      ECOG FS: 1 - Symptomatic but completely ambulatory  General appearance: Alert, cooperative, well nourished, no apparent distress Head: Normocephalic, without obvious abnormality, atraumatic Eyes: Conjunctivae/corneas clear, PERRLA, EOMI Nose: Nares, septum and mucosa are normal, no drainage or sinus tenderness Neck: No adenopathy, supple, symmetrical, trachea midline, thyroid not enlarged, no tenderness Resp: Clear to auscultation bilaterally Cardio: Regular rate and rhythm, S1, S2 normal, no murmur, click, rub or gallop GI: Soft, distended, upper left abdominal tenderness, hypoactive bowel sounds, no organomegaly Extremities: Extremities normal, atraumatic, no cyanosis or edema M/S:  No tenderness, full range of motion Lymph nodes: Cervical, supraclavicular, and axillary nodes normal Neurologic: Grossly normal    LAB RESULTS: Lab Results  Component Value Date   WBC 9.6 06/26/2013   NEUTROABS 6.2 06/26/2013   HGB 10.9* 06/26/2013   HCT 32.7* 06/26/2013   MCV 90.1 06/26/2013   PLT 236 06/26/2013      Chemistry      Component Value  Date/Time   NA 136 06/19/2013 0845   NA 133* 06/11/2013 0354   K 3.9 06/19/2013 0845   K 3.7 06/11/2013 0354   CL 105 06/19/2013 0845   CL 101 06/11/2013 0354   CO2 21* 06/19/2013 0845   CO2 28 06/11/2013 0354   BUN 13.7 06/19/2013 0845   BUN 13 06/11/2013 0354   CREATININE 0.7 06/19/2013 0845   CREATININE 0.63 06/11/2013 0354      Component Value Date/Time   CALCIUM 9.0 06/19/2013 0845   CALCIUM 8.4 06/11/2013 0354   ALKPHOS 61 06/19/2013 0845   ALKPHOS 67 06/08/2013 1315   AST 25 06/19/2013 0845   AST 45* 06/08/2013 1315   ALT 31 06/19/2013 0845   ALT 35 06/08/2013 1315   BILITOT 1.08 06/19/2013 0845   BILITOT 0.9 06/08/2013 1315      Urinalysis    Component Value Date/Time   LABSPEC  1.005 06/19/2013 1127   LABSPEC 1.015 01/17/2011 1121   PHURINE 6.5 01/17/2011 1121   GLUCOSEU Negative 06/19/2013 1127   HGBUR LARGE* 01/17/2011 1121   BILIRUBINUR NEGATIVE 01/17/2011 1121   KETONESUR NEGATIVE 01/17/2011 1121   PROTEINUR NEGATIVE 01/17/2011 1121   UROBILINOGEN 0.2 06/19/2013 1127   UROBILINOGEN 0.2 01/17/2011 1121   NITRITE NEGATIVE 01/17/2011 1121   LEUKOCYTESUR LARGE Biochemical Testing Only. Please order routine urinalysis from main lab if confirmatory testing is needed.* 01/17/2011 1121    STUDIES: No results found.   ASSESSMENT: 32 y.o. [place], [place]: 1.  ITP  2. History of iron deficiency anemia 3. Acute pericarditis   PLAN: On Monday 06/22/2013 she begin a Dexamethasone taper (from Dexamethasone 4 mg daily). She is taking Dexamethasone 2 mg for one week (until 06/28/2013).  Then she will start taking Dexamethasone 2 mg every other day for 2 weeks.  She will stop taking Dexamethasone at that time (around 07/12/2013).   Ms. Delange will receive her third weekly dose of Rituxan today.  Our treatment plan consists of her receiving outpatient chemotherapy with weekly Rituxan for 5 cycles (starting on 06/12/2013 - 07/17/2013) (she has a week off of therapy on 07/03/2013 for the  holiday).  We will then continue Rituxan therapy for an additional 2 cycles every other week (on 07/31/2013 and 08/21/2013 respectively).  The patient was asked to report any unusual bleeding or any unusual signs and/or symptoms immediately.    The patient's last dose of IV Feraheme 510 mg was on 02/29/2012.  We will continue to monitor her iron deficiency anemia.  Acute pericarditis is being followed by Cardiologist Bryan Lemma, and the patient has an appointment to see him on 07/02/2013.   The patient is currently taking Colchicine 0.6 mg by mouth twice a day and Famotidine 20 mg by mouth twice a day for acute pericarditis.  The patient had hyperglycemia and leukocytosis secondary to steroid therapy while hospitalized.  We will continue to monitor.    We plan to see Mrs. Olesky in one week for laboratories and a Rituxan infusion.  We will check a CBC and CMP at that time.   All questions were answered.  The patient was encouraged to contact us in the interim with any problems, questions or concerns.   Larina Bras, NP-C 06/26/2013, 10:31 AM   ADDENDUM: Torri is tolerating the rituximab without unusual side effects. Her platelet count is still in the normal range. However it has only been 2 weeks since she received the IVIG, so most of that medication is still in her system. We are continuing to taper the prednisone. The plan is to continue the rituxan weekly x4, then moved to every 2 weeks x2 and then as needed. By then she should be off steroids completely. All this was discussed with the patient and she is "on board" with this plan. The appropriate orders were entered   I personally saw this patient and performed a substantive portion of this encounter with the listed APP documented above.   Lowella Dell, MD

## 2013-06-26 NOTE — Patient Instructions (Addendum)
Please contact us at [phone] if you have any questions or concerns.  Please continue to do well and enjoy life!!!  Get plenty of rest, drink plenty of water, exercise daily (walking), eat a balanced diet.  Report fever, unusual bleeding and any other unusual symptoms immediately.  Find a primary care physician.  For stomach discomfort try Omeprazole (OTC) and/or Mylanta (OTC) as directed.

## 2013-07-02 ENCOUNTER — Ambulatory Visit (INDEPENDENT_AMBULATORY_CARE_PROVIDER_SITE_OTHER): Payer: BC Managed Care – PPO | Admitting: Cardiology

## 2013-07-02 ENCOUNTER — Encounter: Payer: Self-pay | Admitting: Cardiology

## 2013-07-02 VITALS — BP 120/80 | HR 101 | Ht 70.0 in | Wt 267.1 lb

## 2013-07-02 DIAGNOSIS — I309 Acute pericarditis, unspecified: Secondary | ICD-10-CM

## 2013-07-02 DIAGNOSIS — D693 Immune thrombocytopenic purpura: Secondary | ICD-10-CM

## 2013-07-02 NOTE — Assessment & Plan Note (Signed)
Platelets improved now on tapering dose of steroid.  I believe she has one more dose of Rituxan.

## 2013-07-02 NOTE — Patient Instructions (Addendum)
Your physician recommends that you schedule a follow-up appointment in: 2 weeks with Dr. Herbie Baltimore.  Continue medications as prescribed.

## 2013-07-02 NOTE — Assessment & Plan Note (Signed)
No symptoms of pericarditis, all resolved.  No chest pain.  On tapering dose of steroids.  Will continue colchicine for now and have her follow up with Dr. Herbie Baltimore in 2 weeks.  Her hospital echo revealed no abnormalities.

## 2013-07-02 NOTE — Progress Notes (Signed)
07/02/2013   PCP: Hal Morales, MD   Chief Complaint  Patient presents with  . Hospitalization Follow-up    3 weeks, acute pericarditis, abnormal EKG    Primary Cardiologist: Dr. Herbie Baltimore  HPI:  32 year old African American female with history of ITP that was diagnosed in 2009 was set with a long hospital secondary to spontaneous bruising and bleeding from her pounds from brush your teeth. Her platelet count was found to be less than 5000 she was admitted for acute ITP. She also complained of chest pain for 2 weeks prior to admission sharp piercing pain worse in the supine position and relief with sitting forward, she never had chest pain like that before. Cardiac enzymes were negative and her 2-D echo was normal. It was felt this was acute pericarditis secondary to ITP. She was on steroids already for that ITP and colchicine was added to her medical regimen after discussing with hematology. She is back today for followup.  She has no further chest pain and no shortness of breath she feels back to her normal baseline. She has been receiving medications steroids as well as the colchicine and IV Rituxan.  Her steroids have been decreased to 2 mg every other day.  No Known Allergies  Current Outpatient Prescriptions  Medication Sig Dispense Refill  . acetaminophen (TYLENOL) 500 MG tablet Take 500 mg by mouth every 6 (six) hours as needed for pain.      Marland Kitchen colchicine 0.6 MG tablet Take 1 tablet (0.6 mg total) by mouth 2 (two) times daily.  60 tablet  0  . dexamethasone (DECADRON) 4 MG tablet Take 2 mg by mouth every other day.       . famotidine (PEPCID) 20 MG tablet Take 1 tablet (20 mg total) by mouth 2 (two) times daily.      . RiTUXimab (RITUXAN IV) Inject into the vein.      Marland Kitchen traMADol (ULTRAM) 50 MG tablet Take 1 tablet (50 mg total) by mouth every 6 (six) hours as needed.  30 tablet  0   No current facility-administered medications for this visit.    Past Medical  History  Diagnosis Date  . ITP (idiopathic thrombocytopenic purpura)   . Fibroids   . Acute pericarditis, unspecified 06/11/2013    Past Surgical History  Procedure Laterality Date  . Umbilical hernia repair    . Intrauterine device (iud) insertion  04/2012    ZOX:WRUEAVW:UJ colds or fevers, no weight changes Skin:no rashes or ulcers HEENT:no blurred vision, no congestion CV:see HPI PUL:see HPI GI:no diarrhea constipation or melena, no indigestion GU:no hematuria, no dysuria MS:no joint pain, no claudication Neuro:no syncope, no lightheadedness Endo:no diabetes, no thyroid disease  PHYSICAL EXAM BP 120/80  Pulse 101  Ht 5\' 10"  (1.778 m)  Wt 267 lb 1.6 oz (121.156 kg)  BMI 38.32 kg/m2 General:Pleasant affect, NAD Skin:Warm and dry, brisk capillary refill HEENT:normocephalic, sclera clear, mucus membranes moist Neck:supple, no JVD, no bruits  Heart:S1S2 RRR without murmur, gallup, rub or click, no pain with lying flat. Lungs:clear without rales, rhonchi, or wheezes WJX:BJYN, non tender, + BS, do not palpate liver spleen or masses Ext:no lower ext edema, 2+ pedal pulses, 2+ radial pulses Neuro:alert and oriented, MAE, follows commands, + facial symmetry  EKG:ST, rate 101, early repol.  No acute changes,    ASSESSMENT AND PLAN Acute pericarditis, unspecified No symptoms of pericarditis, all resolved.  No chest pain.  On tapering dose of steroids.  Will  continue colchicine for now and have her follow up with Dr. Herbie Baltimore in 2 weeks.  Her hospital echo revealed no abnormalities.   ITP (idiopathic thrombocytopenic purpura) Platelets improved now on tapering dose of steroid.  I believe she has one more dose of Rituxan.    We discussed that she had this type of pain again she needed to call us or hematology it could be a warning of her platelets dropping.  Along with bruising.

## 2013-07-10 ENCOUNTER — Other Ambulatory Visit (HOSPITAL_BASED_OUTPATIENT_CLINIC_OR_DEPARTMENT_OTHER): Payer: BC Managed Care – PPO | Admitting: Lab

## 2013-07-10 ENCOUNTER — Ambulatory Visit (HOSPITAL_BASED_OUTPATIENT_CLINIC_OR_DEPARTMENT_OTHER): Payer: BC Managed Care – PPO

## 2013-07-10 ENCOUNTER — Ambulatory Visit (HOSPITAL_BASED_OUTPATIENT_CLINIC_OR_DEPARTMENT_OTHER): Payer: BC Managed Care – PPO | Admitting: Family

## 2013-07-10 ENCOUNTER — Encounter: Payer: Self-pay | Admitting: Family

## 2013-07-10 VITALS — BP 127/81 | HR 74 | Temp 98.6°F | Resp 20 | Ht 70.0 in | Wt 267.8 lb

## 2013-07-10 VITALS — BP 113/65 | HR 85 | Temp 98.4°F | Resp 20

## 2013-07-10 DIAGNOSIS — D509 Iron deficiency anemia, unspecified: Secondary | ICD-10-CM

## 2013-07-10 DIAGNOSIS — Z5112 Encounter for antineoplastic immunotherapy: Secondary | ICD-10-CM

## 2013-07-10 DIAGNOSIS — D693 Immune thrombocytopenic purpura: Secondary | ICD-10-CM

## 2013-07-10 DIAGNOSIS — E876 Hypokalemia: Secondary | ICD-10-CM

## 2013-07-10 LAB — CBC WITH DIFFERENTIAL/PLATELET
Basophils Absolute: 0 10*3/uL (ref 0.0–0.1)
Eosinophils Absolute: 0.1 10*3/uL (ref 0.0–0.5)
HCT: 36.1 % (ref 34.8–46.6)
HGB: 12 g/dL (ref 11.6–15.9)
MCH: 30.1 pg (ref 25.1–34.0)
MONO#: 0.7 10*3/uL (ref 0.1–0.9)
NEUT#: 3.6 10*3/uL (ref 1.5–6.5)
NEUT%: 65.4 % (ref 38.4–76.8)
RDW: 17.8 % — ABNORMAL HIGH (ref 11.2–14.5)
lymph#: 1.1 10*3/uL (ref 0.9–3.3)

## 2013-07-10 LAB — COMPREHENSIVE METABOLIC PANEL (CC13)
BUN: 7.9 mg/dL (ref 7.0–26.0)
CO2: 23 mEq/L (ref 22–29)
Calcium: 9.1 mg/dL (ref 8.4–10.4)
Chloride: 105 mEq/L (ref 98–109)
Creatinine: 0.8 mg/dL (ref 0.6–1.1)

## 2013-07-10 MED ORDER — SODIUM CHLORIDE 0.9 % IV SOLN
Freq: Once | INTRAVENOUS | Status: AC
  Administered 2013-07-10: 11:00:00 via INTRAVENOUS

## 2013-07-10 MED ORDER — SODIUM CHLORIDE 0.9 % IJ SOLN
10.0000 mL | INTRAMUSCULAR | Status: DC | PRN
  Filled 2013-07-10: qty 10

## 2013-07-10 MED ORDER — DIPHENHYDRAMINE HCL 25 MG PO CAPS
25.0000 mg | ORAL_CAPSULE | Freq: Once | ORAL | Status: AC
  Administered 2013-07-10: 25 mg via ORAL

## 2013-07-10 MED ORDER — SODIUM CHLORIDE 0.9 % IV SOLN
375.0000 mg/m2 | Freq: Once | INTRAVENOUS | Status: AC
  Administered 2013-07-10: 900 mg via INTRAVENOUS
  Filled 2013-07-10: qty 90

## 2013-07-10 MED ORDER — HEPARIN SOD (PORK) LOCK FLUSH 100 UNIT/ML IV SOLN
500.0000 [IU] | Freq: Once | INTRAVENOUS | Status: DC | PRN
  Filled 2013-07-10: qty 5

## 2013-07-10 MED ORDER — ACETAMINOPHEN 325 MG PO TABS
650.0000 mg | ORAL_TABLET | Freq: Once | ORAL | Status: AC
  Administered 2013-07-10: 650 mg via ORAL

## 2013-07-10 NOTE — Progress Notes (Signed)
[facility]  [phone] [phone]  OFFICE PROGRESS NOTE   ID: Bailey Sheppard   DOB: 1981-06-19  MR#: [id number]  AOZ#:[id number]   PCP: Hal Morales, MD   HISTORY OF PRESENT ILLNESS: [name] is a  [place], [name] who moved from [place] with a documented history of ITP.  She was first diagnosed with ITP by a hematologist in [place] in 2007.  At that time she had a history of cytopenia with associated heavy menstrual bleeding.  She presented to our ED in 02/2012 with an unusual headache and bruises on different areas of her body.  She was seen by Dr. 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 The patient stated while living in [place], her blood counts had been up and down but she only received one course of high-dose prednisone in the prior 6 years. When she had been treated with oral steroids, she responded to the treatment.  Dr. Donnie Coffin also noted absent iron stores. Her iron deficiency had been treated with IV iron multiple times.  She moved back to [place] in 2010,  but had not sought medical attention before presenting to the ED in 02/2012.   Dr. Donnie Coffin treated [name] with a slow Prednisone taper for relapsed ITP.  She had been off steroids since 04/2012 and was being observed. Her platelet counts had normalized. She has a history of having  heavy menses but her menses have been scant if any since having an IUD placed in 04/2012.  [name] called the office of the Cancer Center on 06/08/2013 reporting new areas of bruising in all extremities, especially in thighs, gum bleeding when brushing her teeth, and blood clots from the left nostril. CBC results obtained at the [facility] revealed a platelet count of 5000.  Further evaluation at the ED was recommended.  Ms. Marini was admitted to the hospital from 06/08/2013 - 06/12/2013 and was  diagnosed with relapsed ITP and acute pericarditis.  While hospitalized, the patient received intravenous Dexamethasone and IVIG.  Her platelet counts had normalized to 259,000 following hospitalization.   INTERVAL HISTORY: Bailey Sheppard was seen today for follow up of ITP and prior to Rituxan infusion.  She was last seen by [place] on 06/26/2013 when she received her third Rituxan infusion.  She has been tolerating the Rituxan infusions relatively well. The patient has been encouraged to report any unusual signs or symptoms after her infusions including any unusual bleeding immediately.  Her interval history is significant for being evaluated by Dr. Elissa Hefty office on 07/02/2013.  Her cardiology office evaluation note states that her pericarditis is resolved, but she will continue on current medication of Colchicine for the next 2 weeks until her next office appointment.     REVIEW OF SYSTEMS: A 10 point review of systems was completed and is negative except for sinus pressure with limited epistaxis confined to nare canals.  The patient said she had nosebleeds 3 or 4 times this week but she did not actually see any blood until she blew her nose.  [name] denies any other symptomatology.   PAST MEDICAL HISTORY: Past Medical History  Diagnosis Date  . ITP (idiopathic thrombocytopenic purpura)   . Fibroids   . Acute pericarditis, unspecified 06/11/2013    PAST SURGICAL HISTORY: Past Surgical History  Procedure Laterality Date  . Umbilical hernia repair    . Intrauterine device (iud) insertion  04/2012  FAMILY HISTORY Family History  Problem Relation Age of Onset  . Hypertension Mother   . Diabetes Father   . Lupus Cousin     GYNECOLOGIC HISTORY:   Menarche age 83, she is G3 P2 A1, parity age 66, she has an IUD in place.   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 They have 2 sons, aged 23 and 3.    ADVANCED DIRECTIVES:  Not on file  HEALTH MAINTENANCE: History  Substance Use Topics  . Smoking status: Never Smoker   . Smokeless tobacco: Never Used  . Alcohol Use: Yes     Comment: Rare    Colonoscopy: N/A PAP:  03/2012 Bone density:  N/A Lipid panel:  Not on file  No Known Allergies  Current Outpatient Prescriptions  Medication Sig Dispense Refill  . acetaminophen (TYLENOL) 500 MG tablet Take 500 mg by mouth every 6 (six) hours as needed for pain.      Marland Kitchen colchicine 0.6 MG tablet Take 1 tablet (0.6 mg total) by mouth 2 (two) times daily.  60 tablet  0  . dexamethasone (DECADRON) 4 MG tablet Take 2 mg by mouth every other day.       . famotidine (PEPCID) 20 MG tablet Take 1 tablet (20 mg total) by mouth 2 (two) times daily.      . RiTUXimab (RITUXAN IV) Inject into the vein.      Marland Kitchen traMADol (ULTRAM) 50 MG tablet Take 1 tablet (50 mg total) by mouth every 6 (six) hours as needed.  30 tablet  0   No current facility-administered medications for this visit.   Facility-Administered Medications Ordered in Other Visits  Medication Dose Route Frequency Provider Last Rate Last Dose  . heparin lock flush 100 unit/mL  500 Units Intracatheter Once PRN Lowella Dell, MD      . sodium chloride 0.9 % injection 10 mL  10 mL Intracatheter PRN Lowella Dell, MD        OBJECTIVE: Filed Vitals:   07/10/13 0919  BP: 127/81  Pulse: 74  Temp: 98.6 F (37 C)  Resp: 20     Body mass index is 38.43 kg/(m^2).      ECOG FS: 1 - Symptomatic but completely ambulatory  General appearance: Alert, cooperative, well nourished, no apparent distress Head: Normocephalic, without obvious abnormality, atraumatic Eyes: Conjunctivae/corneas clear, PERRLA, EOMI Nose: Nares, septum and mucosa are normal, no drainage or sinus tenderness Neck: No adenopathy, supple, symmetrical, trachea midline, thyroid not  enlarged, no tenderness Resp: Clear to auscultation bilaterally Cardio: Regular rate and rhythm, S1, S2 normal, no murmur, click, rub or gallop GI: Soft, distended, no tenderness, hypoactive bowel sounds, no organomegaly Extremities: Extremities normal, atraumatic, no cyanosis or edema M/S:  No tenderness, full range of motion Lymph nodes: Cervical, supraclavicular, and axillary nodes normal Neurologic: Grossly normal    LAB RESULTS: Lab Results  Component Value Date   WBC 5.5 07/10/2013   NEUTROABS 3.6 07/10/2013   HGB 12.0 07/10/2013   HCT 36.1 07/10/2013   MCV 90.5 07/10/2013   PLT 170 07/10/2013      Chemistry      Component Value Date/Time   NA 137 07/10/2013 0859   NA 133* 06/11/2013 0354   K 3.4* 07/10/2013 0859   K 3.7 06/11/2013 0354   CL 105 06/19/2013 0845  CL 101 06/11/2013 0354   CO2 23 07/10/2013 0859   CO2 28 06/11/2013 0354   BUN 7.9 07/10/2013 0859   BUN 13 06/11/2013 0354   CREATININE 0.8 07/10/2013 0859   CREATININE 0.63 06/11/2013 0354      Component Value Date/Time   CALCIUM 9.1 07/10/2013 0859   CALCIUM 8.4 06/11/2013 0354   ALKPHOS 48 07/10/2013 0859   ALKPHOS 67 06/08/2013 1315   AST 16 07/10/2013 0859   AST 45* 06/08/2013 1315   ALT 21 07/10/2013 0859   ALT 35 06/08/2013 1315   BILITOT 0.64 07/10/2013 0859   BILITOT 0.9 06/08/2013 1315      Urinalysis    Component Value Date/Time   LABSPEC 1.005 06/19/2013 1127   LABSPEC 1.015 01/17/2011 1121   PHURINE 6.5 01/17/2011 1121   GLUCOSEU Negative 06/19/2013 1127   HGBUR LARGE* 01/17/2011 1121   BILIRUBINUR NEGATIVE 01/17/2011 1121   KETONESUR NEGATIVE 01/17/2011 1121   PROTEINUR NEGATIVE 01/17/2011 1121   UROBILINOGEN 0.2 06/19/2013 1127   UROBILINOGEN 0.2 01/17/2011 1121   NITRITE NEGATIVE 01/17/2011 1121   LEUKOCYTESUR LARGE Biochemical Testing Only. Please order routine urinalysis from main lab if confirmatory testing is needed.* 01/17/2011 1121    STUDIES: No results found.   ASSESSMENT: 32 y.o. [place], [place]: 1.  ITP  2. History of iron deficiency anemia 3. Acute pericarditis - resolved (but the patient still taking Colchicine daily) 4. Hypokalemia    PLAN: On Monday 06/22/2013 she began a Dexamethasone taper (from Dexamethasone 4 mg daily). She completed taking Dexamethasone 2 mg for one week on 06/28/2013.  She then started taking Dexamethasone 2 mg every other day for 2 weeks.  She will stop taking Dexamethasone on 07/12/2013 when her steroid taper will be completed.  Ms. Gambrill had hyperglycemia and leukocytosis secondary to steroid therapy while hospitalized.  We will continue to monitor.   Ms. Weddington will receive her fourth weekly dose of Rituxan today.  Our treatment plan consists of her receiving outpatient chemotherapy with weekly Rituxan for 5 cycles (starting on 06/12/2013 - 07/17/2013) (she had a week off of therapy on 07/03/2013 for the holiday).  We will then continue Rituxan therapy for an additional 2 cycles every other week (on 07/31/2013 and 08/21/2013 respectively).  The patient was asked to report any unusual bleeding or any unusual signs and/or symptoms immediately.    The patient's last dose of IV Feraheme 510 mg was on 02/29/2012.  We will continue to monitor her iron deficiency anemia.  Acute pericarditis is being followed by Cardiologist Bryan Lemma, and the patient has an appointment to see him again on 07/22/2013.   The patient is currently taking Colchicine 0.6 mg by mouth twice a day and Famotidine 20 mg by mouth twice a day for acute pericarditis.  Her hypokalemia is very mild and we will monitor her laboratory values (potassium level) next week.   We plan to see Mrs. Albano in one week for laboratories and a Rituxan infusion.  We will check a CBC and CMP at that time.   All questions were answered.  The patient was encouraged to contact us in the interim with any problems, questions or concerns.   Larina Bras, NP-C 07/10/2013, 1:13 PM

## 2013-07-10 NOTE — Patient Instructions (Addendum)
Please contact us at [phone] if you have any questions or concerns.  Please continue to do well and enjoy life!!!  Get plenty of rest, drink plenty of water, exercise daily (walking), eat a balanced diet.  Mylanta or Maalox for stomach discomfort.  Loratadine daily for allergy symptoms as needed.  Results for orders placed in visit on 07/10/13 (from the past 24 hour(s))  CBC WITH DIFFERENTIAL     Status: Abnormal   Collection Time    07/10/13  8:58 AM      Result Value Range   WBC 5.5  3.9 - 10.3 10e3/uL   NEUT# 3.6  1.5 - 6.5 10e3/uL   HGB 12.0  11.6 - 15.9 g/dL   HCT 16.1  09.6 - 04.5 %   Platelets 170  145 - 400 10e3/uL   MCV 90.5  79.5 - 101.0 fL   MCH 30.1  25.1 - 34.0 pg   MCHC 33.2  31.5 - 36.0 g/dL   RBC 4.09  8.11 - 9.14 10e6/uL   RDW 17.8 (*) 11.2 - 14.5 %   lymph# 1.1  0.9 - 3.3 10e3/uL   MONO# 0.7  0.1 - 0.9 10e3/uL   Eosinophils Absolute 0.1  0.0 - 0.5 10e3/uL   Basophils Absolute 0.0  0.0 - 0.1 10e3/uL   NEUT% 65.4  38.4 - 76.8 %   LYMPH% 20.1  14.0 - 49.7 %   MONO% 12.1  0.0 - 14.0 %   EOS% 2.2  0.0 - 7.0 %   BASO% 0.2  0.0 - 2.0 %   Narrative:    Performed At:  [facility]               [address]               [place], [place] [address]

## 2013-07-10 NOTE — Patient Instructions (Signed)
[facility] Discharge Instructions for Patients Receiving Chemotherapy  Today you received the following chemotherapy agents Rituxan.  To help prevent nausea and vomiting after your treatment, we encourage you to take your nausea medication as prescribed.   If you develop nausea and vomiting that is not controlled by your nausea medication, call the clinic.   BELOW ARE SYMPTOMS THAT SHOULD BE REPORTED IMMEDIATELY:  *FEVER GREATER THAN 100.5 F  *CHILLS WITH OR WITHOUT FEVER  NAUSEA AND VOMITING THAT IS NOT CONTROLLED WITH YOUR NAUSEA MEDICATION  *UNUSUAL SHORTNESS OF BREATH  *UNUSUAL BRUISING OR BLEEDING  TENDERNESS IN MOUTH AND THROAT WITH OR WITHOUT PRESENCE OF ULCERS  *URINARY PROBLEMS  *BOWEL PROBLEMS  UNUSUAL RASH Items with * indicate a potential emergency and should be followed up as soon as possible.  Feel free to call the clinic you have any questions or concerns. The clinic phone number is [phone].    

## 2013-07-16 ENCOUNTER — Ambulatory Visit: Payer: BC Managed Care – PPO | Admitting: Cardiology

## 2013-07-17 ENCOUNTER — Ambulatory Visit (HOSPITAL_BASED_OUTPATIENT_CLINIC_OR_DEPARTMENT_OTHER): Payer: BC Managed Care – PPO | Admitting: Family

## 2013-07-17 ENCOUNTER — Encounter: Payer: Self-pay | Admitting: Family

## 2013-07-17 ENCOUNTER — Ambulatory Visit (HOSPITAL_BASED_OUTPATIENT_CLINIC_OR_DEPARTMENT_OTHER): Payer: BC Managed Care – PPO

## 2013-07-17 ENCOUNTER — Other Ambulatory Visit (HOSPITAL_BASED_OUTPATIENT_CLINIC_OR_DEPARTMENT_OTHER): Payer: BC Managed Care – PPO | Admitting: Lab

## 2013-07-17 VITALS — BP 103/81 | HR 64 | Temp 98.2°F | Resp 16

## 2013-07-17 VITALS — BP 125/82 | HR 74 | Temp 98.6°F | Resp 20 | Ht 70.0 in | Wt 269.6 lb

## 2013-07-17 DIAGNOSIS — D693 Immune thrombocytopenic purpura: Secondary | ICD-10-CM

## 2013-07-17 DIAGNOSIS — I309 Acute pericarditis, unspecified: Secondary | ICD-10-CM

## 2013-07-17 DIAGNOSIS — E875 Hyperkalemia: Secondary | ICD-10-CM

## 2013-07-17 DIAGNOSIS — D509 Iron deficiency anemia, unspecified: Secondary | ICD-10-CM

## 2013-07-17 DIAGNOSIS — Z5112 Encounter for antineoplastic immunotherapy: Secondary | ICD-10-CM

## 2013-07-17 LAB — CBC WITH DIFFERENTIAL/PLATELET
BASO%: 0.2 % (ref 0.0–2.0)
Basophils Absolute: 0 10*3/uL (ref 0.0–0.1)
HCT: 36.2 % (ref 34.8–46.6)
HGB: 12.3 g/dL (ref 11.6–15.9)
MONO#: 0.6 10*3/uL (ref 0.1–0.9)
NEUT#: 3.4 10*3/uL (ref 1.5–6.5)
NEUT%: 66.3 % (ref 38.4–76.8)
WBC: 5.1 10*3/uL (ref 3.9–10.3)
lymph#: 1 10*3/uL (ref 0.9–3.3)

## 2013-07-17 LAB — COMPREHENSIVE METABOLIC PANEL (CC13)
AST: 24 U/L (ref 5–34)
Albumin: 3.3 g/dL — ABNORMAL LOW (ref 3.5–5.0)
BUN: 9.2 mg/dL (ref 7.0–26.0)
Calcium: 9.2 mg/dL (ref 8.4–10.4)
Chloride: 104 mEq/L (ref 98–109)
Glucose: 91 mg/dl (ref 70–140)
Potassium: 4 mEq/L (ref 3.5–5.1)

## 2013-07-17 MED ORDER — SODIUM CHLORIDE 0.9 % IV SOLN
Freq: Once | INTRAVENOUS | Status: AC
  Administered 2013-07-17: 10:00:00 via INTRAVENOUS

## 2013-07-17 MED ORDER — ACETAMINOPHEN 325 MG PO TABS
650.0000 mg | ORAL_TABLET | Freq: Once | ORAL | Status: AC
  Administered 2013-07-17: 650 mg via ORAL

## 2013-07-17 MED ORDER — DIPHENHYDRAMINE HCL 25 MG PO CAPS
25.0000 mg | ORAL_CAPSULE | Freq: Once | ORAL | Status: AC
  Administered 2013-07-17: 25 mg via ORAL

## 2013-07-17 MED ORDER — SODIUM CHLORIDE 0.9 % IV SOLN
375.0000 mg/m2 | Freq: Once | INTRAVENOUS | Status: AC
  Administered 2013-07-17: 900 mg via INTRAVENOUS
  Filled 2013-07-17: qty 90

## 2013-07-17 NOTE — Patient Instructions (Addendum)
Please contact us at [phone] if you have any questions or concerns.  Please continue to do well and enjoy life!!!  Get plenty of rest, drink plenty of water, exercise daily, eat a balanced diet.   Potassium Rich Foods List - Foods High in Potassium Your potassium is low, try to include as many potassium rich foods in your diet as as possible, some foods high in potassium are various fruits, vegetables, dairy foods, and fish.       List of Foods High in Potassium: Foods with Potassium  Serving Size Potassium (mg)  Apricots, dried 10 halves 407  Avocados, raw 1 ounce 180  Bananas, raw 1 cup 594  Beets, cooked 1 cup 519  Brussel sprouts, cooked 1 cup 504  Cantaloupe 1 cup 494  Dates, dry 5 dates 271  Figs, dry 2 figs 271  Kiwi fruit, raw 1 medium 252  Lima beans 1 cup 955  Melons, honeydew 1 cup 461  Milk, fat free or skim 1 cup 407  Nectarines 1 nectarine 288  Orange juice 1 cup 496  Oranges 1 orange 237  Pears (fresh) 1 pear 208  Peanuts dry roasted, unsalted 1 ounce 187  Potatoes, baked, 1 potato 1081  Prune juice 1 cup 707  Prunes, dried 1 cup 828  Raisins 1 cup 1089  Spinach, cooked 1 cup 839  Tomato products, canned sauce 1 cup 909  Winter squash 1 cup 896  Yogurt plain, skim milk 8 ounces 579  USDA Nutrient Database for Standard References, Release 15 for Potassium, K (mg)   Results for orders placed in visit on 07/17/13 (from the past 24 hour(s))  CBC WITH DIFFERENTIAL     Status: Abnormal   Collection Time    07/17/13  8:44 AM      Result Value Range   WBC 5.1  3.9 - 10.3 10e3/uL   NEUT# 3.4  1.5 - 6.5 10e3/uL   HGB 12.3  11.6 - 15.9 g/dL   HCT 56.2  13.0 - 86.5 %   Platelets 273  145 - 400 10e3/uL   MCV 89.6  79.5 - 101.0 fL   MCH 30.4  25.1 - 34.0 pg   MCHC 34.0  31.5 - 36.0 g/dL   RBC 7.84  6.96 - 2.95 10e6/uL   RDW 16.2 (*) 11.2 - 14.5 %   lymph# 1.0  0.9 - 3.3 10e3/uL   MONO# 0.6  0.1 - 0.9 10e3/uL   Eosinophils Absolute 0.1  0.0 - 0.5 10e3/uL    Basophils Absolute 0.0  0.0 - 0.1 10e3/uL   NEUT% 66.3  38.4 - 76.8 %   LYMPH% 19.4  14.0 - 49.7 %   MONO% 12.1  0.0 - 14.0 %   EOS% 2.0  0.0 - 7.0 %   BASO% 0.2  0.0 - 2.0 %   nRBC 0  0 - 0 %   Narrative:    Performed At:  [facility]               [address]               [place], [place] [address]

## 2013-07-17 NOTE — Patient Instructions (Addendum)
[facility] Discharge Instructions for Patients Receiving Chemotherapy  Today you received the following chemotherapy agents Rituxan.  To help prevent nausea and vomiting after your treatment, we encourage you to take your nausea medication as prescribed.   If you develop nausea and vomiting that is not controlled by your nausea medication, call the clinic.   BELOW ARE SYMPTOMS THAT SHOULD BE REPORTED IMMEDIATELY:  *FEVER GREATER THAN 100.5 F  *CHILLS WITH OR WITHOUT FEVER  NAUSEA AND VOMITING THAT IS NOT CONTROLLED WITH YOUR NAUSEA MEDICATION  *UNUSUAL SHORTNESS OF BREATH  *UNUSUAL BRUISING OR BLEEDING  TENDERNESS IN MOUTH AND THROAT WITH OR WITHOUT PRESENCE OF ULCERS  *URINARY PROBLEMS  *BOWEL PROBLEMS  UNUSUAL RASH Items with * indicate a potential emergency and should be followed up as soon as possible.  Feel free to call the clinic you have any questions or concerns. The clinic phone number is [phone].    

## 2013-07-17 NOTE — Progress Notes (Signed)
[facility]  Telephone:(336) [phone] [phone]  OFFICE PROGRESS NOTE   ID: Bailey Sheppard   DOB: Nov 17, 1981  MR#: [id number]  ZHY#:[id number]  PCP:  Anna Genre, M.D. GYN: Hal Morales, MD   HISTORY OF PRESENT ILLNESS: Bailey Sheppard is a  [place], [name] who moved from [place] with a documented history of ITP.  She was first diagnosed with ITP by a hematologist in [place] in 2007.  At that time she had a history of cytopenia with associated heavy menstrual bleeding.  She presented to our ED in 02/2012 with an unusual headache and bruises on different areas of her body.  She was seen by Dr. 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 The patient stated while living in [place], her blood counts had been up and down but she only received one course of high-dose prednisone in the prior 6 years. When she had been treated with oral steroids, she responded to the treatment.  Dr. Donnie Coffin also noted absent iron stores. Her iron deficiency had been treated with IV iron multiple times.  She moved back to [place] in 2010,  but had not sought medical attention before presenting to the ED in 02/2012.   Dr. Donnie Coffin treated Bailey Sheppard with a slow Prednisone taper for relapsed ITP.  She had been off steroids since 04/2012 and was being observed. Her platelet counts had normalized. She has a history of having  heavy menses but her menses have been scant if any since having an IUD placed in 04/2012.  Bailey Sheppard called the office of the Cancer Center on 06/08/2013 reporting new areas of bruising in all extremities, especially in thighs, gum bleeding when brushing her teeth, and blood clots from the left nostril. CBC results obtained at the [facility] revealed a platelet count of 5000.  Further evaluation at the ED was recommended.  Bailey Sheppard was admitted to the hospital from 06/08/2013  - 06/12/2013 and was diagnosed with relapsed ITP and acute pericarditis.  While hospitalized, the patient received intravenous Dexamethasone and IVIG.  Her platelet counts had normalized to 259,000 following hospitalization.   INTERVAL HISTORY: Bailey Sheppard was seen today for follow up of ITP and prior to Rituxan infusion.  She was last seen by [place] on 07/17/2013 when she received her fourth Rituxan infusion.  She has been tolerating the Rituxan infusions relatively well. The patient has been encouraged to report any unusual signs or symptoms after her infusions including any unusual bleeding immediately.    REVIEW OF SYSTEMS: A 10 point review of systems was completed and is negative except for having what appears to be a migraine headache 2 days ago with associated nausea.  She also reported feeling achy the day prior. Bailey Sheppard denies any other symptomatology.   PAST MEDICAL HISTORY: Past Medical History  Diagnosis Date  . ITP (idiopathic thrombocytopenic purpura)   . Fibroids   . Acute pericarditis, unspecified 06/11/2013    PAST SURGICAL HISTORY: Past Surgical History  Procedure Laterality Date  . Umbilical hernia repair    . Intrauterine device (iud) insertion  04/2012    FAMILY HISTORY Family History  Problem Relation Age of Onset  . Hypertension Mother   . Diabetes Father   . Lupus Cousin     GYNECOLOGIC HISTORY:   G3 P2 A1, menarche age 51,  parity age 25, she has an IUD in place.   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 The patient is in a stable relationship (12 years) with Erie Noe, who works as a Production designer, theatre/television/film for [employer]. They have 2 sons, aged 26 and 3.    ADVANCED DIRECTIVES:  Not on file  HEALTH MAINTENANCE: History  Substance Use Topics  . Smoking status: Never Smoker   . Smokeless tobacco: Never Used  . Alcohol Use: Yes     Comment: Rare    Colonoscopy: N/A PAP:  03/2012 Bone density:   N/A Lipid panel:  Not on file  No Known Allergies  Current Outpatient Prescriptions  Medication Sig Dispense Refill  . acetaminophen (TYLENOL) 500 MG tablet Take 500 mg by mouth every 6 (six) hours as needed for pain.      Marland Kitchen colchicine 0.6 MG tablet Take 1 tablet (0.6 mg total) by mouth 2 (two) times daily.  60 tablet  0  . omeprazole (PRILOSEC) 20 MG capsule Take 20 mg by mouth daily.      . RiTUXimab (RITUXAN IV) Inject into the vein.      Marland Kitchen traMADol (ULTRAM) 50 MG tablet Take 1 tablet (50 mg total) by mouth every 6 (six) hours as needed.  30 tablet  0   No current facility-administered medications for this visit.    OBJECTIVE: Filed Vitals:   07/17/13 0910  BP: 125/82  Pulse: 74  Temp: 98.6 F (37 C)  Resp: 20     Body mass index is 38.68 kg/(m^2).      ECOG FS: 1 - Symptomatic but completely ambulatory  General appearance: Alert, cooperative, well nourished, no apparent distress Head: Normocephalic, without obvious abnormality, atraumatic Eyes: Conjunctivae/corneas clear, PERRLA, EOMI Nose: Nares, septum and mucosa are normal, no drainage or sinus tenderness Neck: No adenopathy, supple, symmetrical, trachea midline, thyroid not enlarged, no tenderness Resp: Clear to auscultation bilaterally Cardio: Regular rate and rhythm, S1, S2 normal, no murmur, click, rub or gallop GI: Soft, distended, no tenderness, hypoactive bowel sounds, no organomegaly Extremities: Extremities normal, atraumatic, no cyanosis or edema M/S:  No tenderness, full range of motion Lymph nodes: Cervical, supraclavicular, and axillary nodes normal Neurologic: Grossly normal    LAB RESULTS: Lab Results  Component Value Date   WBC 5.1 07/17/2013   NEUTROABS 3.4 07/17/2013   HGB 12.3 07/17/2013   HCT 36.2 07/17/2013   MCV 89.6 07/17/2013   PLT 273 07/17/2013      Chemistry      Component Value Date/Time   NA 138 07/17/2013 0844   NA 133* 06/11/2013 0354   K 4.0 07/17/2013 0844   K 3.7 06/11/2013  0354   CL 105 06/19/2013 0845   CL 101 06/11/2013 0354   CO2 25 07/17/2013 0844   CO2 28 06/11/2013 0354   BUN 9.2 07/17/2013 0844   BUN 13 06/11/2013 0354   CREATININE 0.7 07/17/2013 0844   CREATININE 0.63 06/11/2013 0354      Component Value Date/Time   CALCIUM 9.2 07/17/2013 0844   CALCIUM 8.4 06/11/2013 0354   ALKPHOS 51 07/17/2013 0844   ALKPHOS 67 06/08/2013 1315   AST 24 07/17/2013 0844   AST 45* 06/08/2013 1315   ALT 21 07/17/2013 0844   ALT 35 06/08/2013 1315   BILITOT 0.57 07/17/2013 0844   BILITOT 0.9 06/08/2013 1315      Urinalysis    Component Value Date/Time   LABSPEC 1.005 06/19/2013 1127   LABSPEC 1.015 01/17/2011 1121   PHURINE 6.5 01/17/2011 1121  GLUCOSEU Negative 06/19/2013 1127   HGBUR LARGE* 01/17/2011 1121   BILIRUBINUR NEGATIVE 01/17/2011 1121   KETONESUR NEGATIVE 01/17/2011 1121   PROTEINUR NEGATIVE 01/17/2011 1121   UROBILINOGEN 0.2 06/19/2013 1127   UROBILINOGEN 0.2 01/17/2011 1121   NITRITE NEGATIVE 01/17/2011 1121   LEUKOCYTESUR LARGE Biochemical Testing Only. Please order routine urinalysis from main lab if confirmatory testing is needed.* 01/17/2011 1121    STUDIES: No results found.   ASSESSMENT: 32 y.o. [place], [place] woman: 1.  ITP  2. History of iron deficiency anemia 3. Acute pericarditis - resolved (but the patient is still taking Colchicine daily)   PLAN: On Monday 06/22/2013 she began a Dexamethasone taper (from Dexamethasone 4 mg daily). She completed taking Dexamethasone 2 mg for one week on 06/28/2013.  She then started taking Dexamethasone 2 mg every other day for 2 weeks.  She will stopped taking Dexamethasone on 07/12/2013 when her steroid taper was completed.  Bailey Sheppard had hyperglycemia and leukocytosis secondary to steroid therapy while hospitalized.    Bailey Sheppard will receive her fifth weekly dose of Rituxan today.  Our treatment plan consists of her receiving outpatient chemotherapy with weekly Rituxan for 5 cycles (starting on  06/12/2013 - 07/17/2013) (she had a week off of therapy on 07/03/2013 for the holiday).  We will then continue Rituxan therapy for an additional 2 cycles every other week (on 07/31/2013 and 08/21/2013 respectively).  The patient was asked to report any unusual bleeding or any unusual signs and/or symptoms immediately.    The patient's last dose of IV Feraheme 510 mg was on 02/29/2012.  We will continue to monitor her iron deficiency anemia.  Acute pericarditis is being followed by Cardiologist Bryan Lemma, and the patient has an appointment to see him again on 07/22/2013.   The patient is currently taking Colchicine 0.6 mg by mouth twice a day and Prilosec 20 mg by mouth daily for acute pericarditis.  Her hypokalemia has resolved and she is sterile encouraged to eat foods rich in potassium.  The patient was provided a list.  An appointment was made today for Bailey Sheppard with her new primary care physician and she has an appointment to establish care with Dr. Anna Genre on 07/30/2013.   We plan to see Bailey Sheppard again on 07/31/2013 for laboratories and a Rituxan infusion.  We will check a CBC and CMP at that time.   All questions were answered.  The patient was encouraged to contact us in the interim with any problems, questions or concerns.   Larina Bras, NP-C 07/17/2013, 5:39 PM

## 2013-07-22 ENCOUNTER — Ambulatory Visit: Payer: BC Managed Care – PPO | Admitting: Cardiology

## 2013-07-24 ENCOUNTER — Encounter: Payer: Self-pay | Admitting: Oncology

## 2013-07-24 NOTE — Progress Notes (Signed)
Put fmla form on nurse's desk °

## 2013-07-31 ENCOUNTER — Ambulatory Visit (HOSPITAL_BASED_OUTPATIENT_CLINIC_OR_DEPARTMENT_OTHER): Payer: BC Managed Care – PPO

## 2013-07-31 ENCOUNTER — Encounter: Payer: Self-pay | Admitting: Family

## 2013-07-31 ENCOUNTER — Other Ambulatory Visit (HOSPITAL_BASED_OUTPATIENT_CLINIC_OR_DEPARTMENT_OTHER): Payer: BC Managed Care – PPO | Admitting: Lab

## 2013-07-31 ENCOUNTER — Ambulatory Visit (HOSPITAL_BASED_OUTPATIENT_CLINIC_OR_DEPARTMENT_OTHER): Payer: BC Managed Care – PPO | Admitting: Family

## 2013-07-31 VITALS — BP 129/79 | HR 77 | Temp 98.3°F | Resp 20 | Ht 70.0 in | Wt 270.9 lb

## 2013-07-31 VITALS — BP 125/78 | HR 77 | Temp 97.1°F | Resp 18

## 2013-07-31 DIAGNOSIS — Z5112 Encounter for antineoplastic immunotherapy: Secondary | ICD-10-CM

## 2013-07-31 DIAGNOSIS — D693 Immune thrombocytopenic purpura: Secondary | ICD-10-CM

## 2013-07-31 DIAGNOSIS — D509 Iron deficiency anemia, unspecified: Secondary | ICD-10-CM

## 2013-07-31 DIAGNOSIS — M549 Dorsalgia, unspecified: Secondary | ICD-10-CM

## 2013-07-31 LAB — COMPREHENSIVE METABOLIC PANEL (CC13)
ALT: 27 U/L (ref 0–55)
AST: 25 U/L (ref 5–34)
CO2: 23 mEq/L (ref 22–29)
Creatinine: 0.7 mg/dL (ref 0.6–1.1)
Sodium: 139 mEq/L (ref 136–145)
Total Bilirubin: 0.63 mg/dL (ref 0.20–1.20)
Total Protein: 7.1 g/dL (ref 6.4–8.3)

## 2013-07-31 LAB — CBC WITH DIFFERENTIAL/PLATELET
BASO%: 0.2 % (ref 0.0–2.0)
EOS%: 4.5 % (ref 0.0–7.0)
LYMPH%: 33.5 % (ref 14.0–49.7)
MCH: 29.4 pg (ref 25.1–34.0)
MCHC: 33.7 g/dL (ref 31.5–36.0)
MONO#: 0.5 10*3/uL (ref 0.1–0.9)
RBC: 4.29 10*6/uL (ref 3.70–5.45)
WBC: 4.9 10*3/uL (ref 3.9–10.3)
lymph#: 1.6 10*3/uL (ref 0.9–3.3)

## 2013-07-31 MED ORDER — ACETAMINOPHEN 325 MG PO TABS
650.0000 mg | ORAL_TABLET | Freq: Once | ORAL | Status: AC
  Administered 2013-07-31: 650 mg via ORAL

## 2013-07-31 MED ORDER — SODIUM CHLORIDE 0.9 % IV SOLN
Freq: Once | INTRAVENOUS | Status: AC
  Administered 2013-07-31: 11:00:00 via INTRAVENOUS

## 2013-07-31 MED ORDER — RITUXIMAB CHEMO INJECTION 10 MG/ML
375.0000 mg/m2 | Freq: Once | INTRAVENOUS | Status: AC
  Administered 2013-07-31: 900 mg via INTRAVENOUS
  Filled 2013-07-31: qty 90

## 2013-07-31 MED ORDER — DIPHENHYDRAMINE HCL 25 MG PO CAPS
25.0000 mg | ORAL_CAPSULE | Freq: Once | ORAL | Status: AC
  Administered 2013-07-31: 25 mg via ORAL

## 2013-07-31 NOTE — Patient Instructions (Addendum)
Please contact us at [phone] if you have any questions or concerns.  Please continue to do well and enjoy life!!!  Get plenty of rest, drink plenty of water, exercise daily, eat a balanced diet.  Results for orders placed in visit on 07/31/13 (from the past 24 hour(s))  CBC WITH DIFFERENTIAL     Status: Abnormal   Collection Time    07/31/13  9:32 AM      Result Value Range   WBC 4.9  3.9 - 10.3 10e3/uL   NEUT# 2.5  1.5 - 6.5 10e3/uL   HGB 12.6  11.6 - 15.9 g/dL   HCT 16.1  09.6 - 04.5 %   Platelets 200  145 - 400 10e3/uL   MCV 87.2  79.5 - 101.0 fL   MCH 29.4  25.1 - 34.0 pg   MCHC 33.7  31.5 - 36.0 g/dL   RBC 4.09  8.11 - 9.14 10e6/uL   RDW 15.0 (*) 11.2 - 14.5 %   lymph# 1.6  0.9 - 3.3 10e3/uL   MONO# 0.5  0.1 - 0.9 10e3/uL   Eosinophils Absolute 0.2  0.0 - 0.5 10e3/uL   Basophils Absolute 0.0  0.0 - 0.1 10e3/uL   NEUT% 51.2  38.4 - 76.8 %   LYMPH% 33.5  14.0 - 49.7 %   MONO% 10.6  0.0 - 14.0 %   EOS% 4.5  0.0 - 7.0 %   BASO% 0.2  0.0 - 2.0 %   Narrative:    Performed At:  [facility]               [address]               [place], [place] [address]

## 2013-07-31 NOTE — Progress Notes (Signed)
[facility]  Telephone:([phone] Fax:(336) [phone]  OFFICE PROGRESS NOTE   ID: Bailey Sheppard   DOB: 04-19-1981  MR#: [id number]  ION#:[id number]  PCP:  Bailey Sheppard, M.D. GYN: Bailey Morales, MD   HISTORY OF PRESENT ILLNESS: [name] is a  [place], [place] woman who moved from [place] with a documented history of ITP.  She was first diagnosed with ITP by a hematologist in [place] in 2007.  At that time she had a history of cytopenia with associated heavy menstrual bleeding.  She presented to our ED in 02/2012 with an unusual headache and bruises on different areas of her body.  She was seen by Dr. 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 The patient stated while living in [place], her blood counts had been up and down but she only received one course of high-dose prednisone in the prior 6 years. When she had been treated with oral steroids, she responded to the treatment.  Dr. Donnie Sheppard also noted absent iron stores. Her iron deficiency had been treated with IV iron multiple times.  She moved back to [place] in 2010,  but had not sought medical attention before presenting to the ED in 02/2012.   Dr. Donnie Sheppard treated [name] with a slow Prednisone taper for relapsed ITP.  She had been off steroids since 04/2012 and was being observed. Her platelet counts had normalized. She has a history of having  heavy menses but her menses have been scant if any since having an IUD placed in 04/2012.  [name] called the office of the Cancer Center on 06/08/2013 reporting new areas of bruising in all extremities, especially in thighs, gum bleeding when brushing her teeth, and blood clots from the left nostril. CBC laboratory results obtained at the [facility] revealed a platelet count of 5000.  Further evaluation at the ED was recommended.  [name] was admitted to the hospital from  06/08/2013 - 06/12/2013 and was diagnosed with relapsed ITP and acute pericarditis.  While hospitalized, the patient received intravenous Dexamethasone and IVIG.  Her platelet counts had normalized to 259,000 following hospitalization.   INTERVAL HISTORY: [name] was seen today for follow up of ITP and prior to Rituxan infusion.  She was last seen by [place] on 07/17/2013 when she received her fifth Rituxan infusion.  She has been tolerating the Rituxan infusions relatively well. The patient has been encouraged to report any unusual signs or symptoms after her infusions including any unusual bleeding immediately.  Her interval history is also significant for establishing care with a primary care physician Dr. Anna Sheppard, M.D.  REVIEW OF SYSTEMS: A 10 point review of systems was completed and is negative except for having mild back pain which is now being followed by Dr. Lorenso Sheppard.  [name] denies any other symptomatology.   PAST MEDICAL HISTORY: Past Medical History  Diagnosis Date  . ITP (idiopathic thrombocytopenic purpura)   . Fibroids   . Acute pericarditis, unspecified 06/11/2013    PAST SURGICAL HISTORY: Past Surgical History  Procedure Laterality Date  . Umbilical hernia repair    . Intrauterine device (iud) insertion  04/2012    FAMILY HISTORY Family History  Problem Relation Age of Onset  . Hypertension Mother   . Diabetes Father   . Lupus Cousin     GYNECOLOGIC HISTORY:   G3 P2 A1, menarche age 53,  parity age 29, she has a  Mirena IUD in place.   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 The patient is in a stable relationship (12 years) with Erie Noe, who works as a Production designer, theatre/television/film for [employer]. They have 2 sons, aged 54 and 3.    ADVANCED DIRECTIVES:  Not on file  HEALTH MAINTENANCE: History  Substance Use Topics  . Smoking status: Never Smoker   . Smokeless tobacco: Never Used  . Alcohol Use: Yes      Comment: Rare    Colonoscopy: N/A PAP:  03/2012 Bone density:  N/A Lipid panel:  Not on file  No Known Allergies  Current Outpatient Prescriptions  Medication Sig Dispense Refill  . acetaminophen (TYLENOL) 500 MG tablet Take 500 mg by mouth every 6 (six) hours as needed for pain.      Marland Kitchen colchicine 0.6 MG tablet Take 1 tablet (0.6 mg total) by mouth 2 (two) times daily.  60 tablet  0  . omeprazole (PRILOSEC) 20 MG capsule Take 20 mg by mouth daily.      . RiTUXimab (RITUXAN IV) Inject into the vein.      Marland Kitchen traMADol (ULTRAM) 50 MG tablet Take 1 tablet (50 mg total) by mouth every 6 (six) hours as needed.  30 tablet  0   No current facility-administered medications for this visit.    OBJECTIVE: Filed Vitals:   07/31/13 0945  BP: 129/79  Pulse: 77  Temp: 98.3 F (36.8 C)  Resp: 20     Body mass index is 38.87 kg/(m^2).      ECOG FS: 1 - Symptomatic but completely ambulatory  General appearance: Alert, cooperative, well nourished, no apparent distress Head: Normocephalic, without obvious abnormality, atraumatic Eyes: Conjunctivae/corneas clear, PERRLA, EOMI Nose: Nares, septum and mucosa are normal, no drainage or sinus tenderness Neck: No adenopathy, supple, symmetrical, trachea midline, thyroid not enlarged, no tenderness Resp: Clear to auscultation bilaterally Cardio: Regular rate and rhythm, S1, S2 normal, no murmur, click, rub or gallop GI: Soft, distended, no tenderness, hypoactive bowel sounds, no organomegaly Extremities: Extremities normal, atraumatic, no cyanosis or edema M/S:  No tenderness, full range of motion Lymph nodes: Cervical, supraclavicular, and axillary nodes normal Neurologic: Grossly normal    LAB RESULTS: Lab Results  Component Value Date   WBC 4.9 07/31/2013   NEUTROABS 2.5 07/31/2013   HGB 12.6 07/31/2013   HCT 37.4 07/31/2013   MCV 87.2 07/31/2013   PLT 200 07/31/2013      Chemistry      Component Value Date/Time   NA 139 07/31/2013 0932    NA 133* 06/11/2013 0354   K 3.7 07/31/2013 0932   K 3.7 06/11/2013 0354   CL 105 06/19/2013 0845   CL 101 06/11/2013 0354   CO2 23 07/31/2013 0932   CO2 28 06/11/2013 0354   BUN 7.5 07/31/2013 0932   BUN 13 06/11/2013 0354   CREATININE 0.7 07/31/2013 0932   CREATININE 0.63 06/11/2013 0354      Component Value Date/Time   CALCIUM 9.2 07/31/2013 0932   CALCIUM 8.4 06/11/2013 0354   ALKPHOS 56 07/31/2013 0932   ALKPHOS 67 06/08/2013 1315   AST 25 07/31/2013 0932   AST 45* 06/08/2013 1315   ALT 27 07/31/2013 0932   ALT 35 06/08/2013 1315   BILITOT 0.63 07/31/2013 0932   BILITOT 0.9 06/08/2013 1315      Urinalysis    Component Value Date/Time   LABSPEC 1.005 06/19/2013 1127   LABSPEC 1.015 01/17/2011  1121   PHURINE 6.5 01/17/2011 1121   GLUCOSEU Negative 06/19/2013 1127   HGBUR LARGE* 01/17/2011 1121   BILIRUBINUR NEGATIVE 01/17/2011 1121   KETONESUR NEGATIVE 01/17/2011 1121   PROTEINUR NEGATIVE 01/17/2011 1121   UROBILINOGEN 0.2 06/19/2013 1127   UROBILINOGEN 0.2 01/17/2011 1121   NITRITE NEGATIVE 01/17/2011 1121   LEUKOCYTESUR LARGE Biochemical Testing Only. Please order routine urinalysis from main lab if confirmatory testing is needed.* 01/17/2011 1121    STUDIES: No results found.   ASSESSMENT: 32 y.o. [place], [place] woman: 1.  ITP  2. History of iron deficiency anemia 3. Acute pericarditis - resolved (the patient is still taking Colchicine daily)   PLAN: On Monday 06/22/2013 she began a Dexamethasone taper (from Dexamethasone 4 mg daily). She completed taking Dexamethasone 2 mg for one week on 06/28/2013.  She then started taking Dexamethasone 2 mg every other day for 2 weeks.  She stopped taking Dexamethasone on 07/12/2013 when her steroid taper was completed.  Ms. Patch had hyperglycemia and leukocytosis secondary to steroid therapy while hospitalized.    Ms. Baria will receive her sixth weekly dose of Rituxan today.  Our treatment plan consists of her receiving outpatient chemotherapy  with weekly Rituxan for 5 cycles (starting on 06/12/2013 - 07/17/2013) (she had a week off of therapy on 07/03/2013 for the holiday).  We will then continue Rituxan therapy for an additional 2 cycles (on 07/31/2013 and 08/21/2013 respectively).  The patient was asked to report any unusual bleeding or any unusual signs and/or symptoms immediately.    The patient's last dose of IV Feraheme 510 mg was on 02/29/2012.  We will continue to monitor her iron deficiency anemia.  Acute pericarditis is being followed by Cardiologist Bryan Lemma, and the patient has an appointment to see him again on 08/03/2013.   The patient is currently taking Colchicine 0.6 mg by mouth twice a day and Prilosec 20 mg by mouth daily for acute pericarditis.   We plan to see Ms. Bigford again on 08/21/2013 for laboratories and a Rituxan infusion.  We will check a CBC, CMP and Ferritin level at that time.   All questions were answered.  The patient was encouraged to contact us in the interim with any problems, questions or concerns.   Larina Bras, NP-C 07/31/2013, 5:10 PM

## 2013-08-01 DIAGNOSIS — Z975 Presence of (intrauterine) contraceptive device: Secondary | ICD-10-CM | POA: Insufficient documentation

## 2013-08-01 DIAGNOSIS — E669 Obesity, unspecified: Secondary | ICD-10-CM | POA: Insufficient documentation

## 2013-08-03 ENCOUNTER — Ambulatory Visit: Payer: BC Managed Care – PPO | Admitting: Cardiology

## 2013-08-11 ENCOUNTER — Telehealth: Payer: Self-pay | Admitting: *Deleted

## 2013-08-11 NOTE — Telephone Encounter (Signed)
Called and spoke with patient and confirmed new time for her appt on 08/21/13 and informed her she would see Zollie Scale, PA.

## 2013-08-20 ENCOUNTER — Other Ambulatory Visit: Payer: Self-pay | Admitting: Physician Assistant

## 2013-08-21 ENCOUNTER — Encounter: Payer: Self-pay | Admitting: Physician Assistant

## 2013-08-21 ENCOUNTER — Other Ambulatory Visit (HOSPITAL_BASED_OUTPATIENT_CLINIC_OR_DEPARTMENT_OTHER): Payer: BC Managed Care – PPO | Admitting: Lab

## 2013-08-21 ENCOUNTER — Ambulatory Visit: Payer: BC Managed Care – PPO | Admitting: Family

## 2013-08-21 ENCOUNTER — Ambulatory Visit (HOSPITAL_BASED_OUTPATIENT_CLINIC_OR_DEPARTMENT_OTHER): Payer: BC Managed Care – PPO

## 2013-08-21 ENCOUNTER — Ambulatory Visit (HOSPITAL_BASED_OUTPATIENT_CLINIC_OR_DEPARTMENT_OTHER): Payer: BC Managed Care – PPO | Admitting: Physician Assistant

## 2013-08-21 ENCOUNTER — Telehealth: Payer: Self-pay | Admitting: Oncology

## 2013-08-21 VITALS — BP 121/76 | HR 53 | Temp 98.3°F | Resp 18

## 2013-08-21 VITALS — BP 105/72 | HR 60 | Temp 98.6°F | Resp 20 | Ht 70.0 in | Wt 270.3 lb

## 2013-08-21 DIAGNOSIS — Z5112 Encounter for antineoplastic immunotherapy: Secondary | ICD-10-CM

## 2013-08-21 DIAGNOSIS — D693 Immune thrombocytopenic purpura: Secondary | ICD-10-CM

## 2013-08-21 DIAGNOSIS — D509 Iron deficiency anemia, unspecified: Secondary | ICD-10-CM

## 2013-08-21 DIAGNOSIS — Z862 Personal history of diseases of the blood and blood-forming organs and certain disorders involving the immune mechanism: Secondary | ICD-10-CM

## 2013-08-21 LAB — CBC WITH DIFFERENTIAL/PLATELET
Basophils Absolute: 0 10*3/uL (ref 0.0–0.1)
Eosinophils Absolute: 0.3 10*3/uL (ref 0.0–0.5)
HGB: 12.2 g/dL (ref 11.6–15.9)
MONO#: 0.5 10*3/uL (ref 0.1–0.9)
NEUT#: 3.2 10*3/uL (ref 1.5–6.5)
RBC: 4.25 10*6/uL (ref 3.70–5.45)
RDW: 13.9 % (ref 11.2–14.5)
WBC: 5.9 10*3/uL (ref 3.9–10.3)

## 2013-08-21 LAB — COMPREHENSIVE METABOLIC PANEL (CC13)
ALT: 25 U/L (ref 0–55)
Albumin: 3.6 g/dL (ref 3.5–5.0)
CO2: 22 mEq/L (ref 22–29)
Calcium: 9.2 mg/dL (ref 8.4–10.4)
Chloride: 109 mEq/L (ref 98–109)
Potassium: 3.7 mEq/L (ref 3.5–5.1)
Sodium: 139 mEq/L (ref 136–145)
Total Protein: 7 g/dL (ref 6.4–8.3)

## 2013-08-21 LAB — FERRITIN CHCC: Ferritin: 13 ng/ml (ref 9–269)

## 2013-08-21 MED ORDER — SODIUM CHLORIDE 0.9 % IV SOLN
375.0000 mg/m2 | Freq: Once | INTRAVENOUS | Status: AC
  Administered 2013-08-21: 900 mg via INTRAVENOUS
  Filled 2013-08-21: qty 90

## 2013-08-21 MED ORDER — DIPHENHYDRAMINE HCL 25 MG PO CAPS
25.0000 mg | ORAL_CAPSULE | Freq: Once | ORAL | Status: AC
  Administered 2013-08-21: 25 mg via ORAL

## 2013-08-21 MED ORDER — ACETAMINOPHEN 325 MG PO TABS
650.0000 mg | ORAL_TABLET | Freq: Once | ORAL | Status: AC
  Administered 2013-08-21: 650 mg via ORAL

## 2013-08-21 MED ORDER — SODIUM CHLORIDE 0.9 % IV SOLN
Freq: Once | INTRAVENOUS | Status: AC
  Administered 2013-08-21: 10:00:00 via INTRAVENOUS

## 2013-08-21 NOTE — Patient Instructions (Addendum)
[facility] Discharge Instructions for Patients Receiving Chemotherapy  Today you received the following chemotherapy agents Rituxan.  To help prevent nausea and vomiting after your treatment, we encourage you to take your nausea medication  As prescribed.   If you develop nausea and vomiting that is not controlled by your nausea medication, call the clinic.   BELOW ARE SYMPTOMS THAT SHOULD BE REPORTED IMMEDIATELY:  *FEVER GREATER THAN 100.5 F  *CHILLS WITH OR WITHOUT FEVER  NAUSEA AND VOMITING THAT IS NOT CONTROLLED WITH YOUR NAUSEA MEDICATION  *UNUSUAL SHORTNESS OF BREATH  *UNUSUAL BRUISING OR BLEEDING  TENDERNESS IN MOUTH AND THROAT WITH OR WITHOUT PRESENCE OF ULCERS  *URINARY PROBLEMS  *BOWEL PROBLEMS  UNUSUAL RASH Items with * indicate a potential emergency and should be followed up as soon as possible.  Feel free to call the clinic you have any questions or concerns. The clinic phone number is [phone].

## 2013-08-21 NOTE — Progress Notes (Signed)
[facility]  Telephone:([phone] Fax:([phone]  OFFICE PROGRESS NOTE   ID: Ezekiel Ina   DOB: [birth year]  MR#: [id number]  ION#:[id number]  PCP:  Anna Genre, M.D. GYN: Hal Morales, MD   HISTORY OF PRESENT ILLNESS: Ms. Goldberger is a  [place], [name] who moved from [place] with a documented history of ITP.  She was first diagnosed with ITP by a hematologist in [place] in 2007.  At that time she had a history of cytopenia with associated heavy menstrual bleeding.  She presented to our ED in 02/2012 with an unusual headache and bruises on different areas of her body.  She was seen by Dr. 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 The patient stated while living in [place], her blood counts had been up and down but she only received one course of high-dose prednisone in the prior 6 years. When she had been treated with oral steroids, she responded to the treatment.  Dr. Donnie Coffin also noted absent iron stores. Her iron deficiency had been treated with IV iron multiple times.  She moved back to [place] in 2010,  but had not sought medical attention before presenting to the ED in 02/2012.   Dr. Donnie Coffin treated [name] with a slow Prednisone taper for relapsed ITP.  She had been off steroids since 04/2012 and was being observed. Her platelet counts had normalized. She has a history of having  heavy menses but her menses have been scant if any since having an IUD placed in 04/2012.  Ms. Krontz called the office of the Cancer Center on 06/08/2013 reporting new areas of bruising in all extremities, especially in thighs, gum bleeding when brushing her teeth, and blood clots from the left nostril. CBC laboratory results obtained at the [facility] revealed a platelet count of 5000.  Further evaluation at the ED was recommended.  Ms. Ruzich was admitted to the hospital from  06/08/2013 - 06/12/2013 and was diagnosed with relapsed ITP and acute pericarditis.  While hospitalized, the patient received intravenous Dexamethasone and IVIG.  Her platelet counts had normalized to 259,000 following hospitalization.   INTERVAL HISTORY: Ms. Vennessa Affinito was seen today for follow up of ITP, prior to her final scheduled Rituxan infusion today.  She has tolerated the rituximab very well, with no infusion reactions. Her platelets have normalized. She completed her dexamethasone taper several weeks ago. Overall, interval history is unremarkable.  REVIEW OF SYSTEMS: Ardell denies any recent illnesses. She's had no fevers or chills. She denies any signs whatsoever of abnormal bruising or bleeding. Her energy level is good, as is her appetite. She's had no nausea or change in bowel or bladder habits. She denies any cough, increased shortness of breath, or chest pain. She's had no abnormal headaches or dizziness, and currently denies any unusual myalgias, arthralgias, or bony pain.  A detailed review of systems is otherwise stable and noncontributory.   PAST MEDICAL HISTORY: Past Medical History  Diagnosis Date  . ITP (idiopathic thrombocytopenic purpura)   . Fibroids   . Acute pericarditis, unspecified 06/11/2013    PAST SURGICAL HISTORY: Past Surgical History  Procedure Laterality Date  . Umbilical hernia repair    . Intrauterine device (iud) insertion  04/2012    FAMILY HISTORY Family History  Problem Relation Age of Onset  . Hypertension Mother   . Diabetes Father   . Lupus Cousin     GYNECOLOGIC HISTORY:  G3 P2 A1, menarche age 23,  parity age 54, she has a Mirena IUD in place.   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 The patient is in a stable relationship (12 years) with Erie Noe, who works as a Production designer, theatre/television/film for [employer]. They have 2 sons, aged 4 and 3.    ADVANCED DIRECTIVES:  Not on file  HEALTH  MAINTENANCE: History  Substance Use Topics  . Smoking status: Never Smoker   . Smokeless tobacco: Never Used  . Alcohol Use: Yes     Comment: Rare    Colonoscopy: N/A PAP:  03/2012 Bone density:  N/A Lipid panel:  Not on file  No Known Allergies  Current Outpatient Prescriptions  Medication Sig Dispense Refill  . acetaminophen (TYLENOL) 500 MG tablet Take 500 mg by mouth every 6 (six) hours as needed for pain.      Marland Kitchen colchicine 0.6 MG tablet Take 1 tablet (0.6 mg total) by mouth 2 (two) times daily.  60 tablet  0  . omeprazole (PRILOSEC) 20 MG capsule Take 20 mg by mouth daily.      . traMADol (ULTRAM) 50 MG tablet Take 1 tablet (50 mg total) by mouth every 6 (six) hours as needed.  30 tablet  0  . RiTUXimab (RITUXAN IV) Inject into the vein.       No current facility-administered medications for this visit.    OBJECTIVE: Filed Vitals:   08/21/13 0857  BP: 105/72  Pulse: 60  Temp: 98.6 F (37 C)  Resp: 20     Body mass index is 38.78 kg/(m^2).     Filed Weights   08/21/13 0857  Weight: 270 lb 4.8 oz (122.607 kg)   young [place] American female who appears comfortable and is in no acute distress  ECOG FS: 1 - Symptomatic but completely ambulatory    Physical Exam: HEENT:  Sclerae anicteric.  Oropharynx clear.  Nodes:  No cervical, supraclavicular or axillary lymphadenopathy palpated.  Breast Exam:  Deferred Lungs:  Clear to auscultation bilaterally. No rhonchi or wheezes  Heart:  Regular rate and rhythm.   Abdomen:  Soft, obese, nontender.  Positive bowel sounds.  Musculoskeletal:  No focal spinal tenderness to palpation.  Extremities:  No peripheral edema Neuro:  Nonfocal. Well oriented, friendly affect    LAB RESULTS: Lab Results  Component Value Date   WBC 5.9 08/21/2013   NEUTROABS 3.2 08/21/2013   HGB 12.2 08/21/2013   HCT 35.8 08/21/2013   MCV 84.2 08/21/2013   PLT 224 08/21/2013      Chemistry      Component Value Date/Time   NA 139 08/21/2013 0831    NA 133* 06/11/2013 0354   K 3.7 08/21/2013 0831   K 3.7 06/11/2013 0354   CL 105 06/19/2013 0845   CL 101 06/11/2013 0354   CO2 22 08/21/2013 0831   CO2 28 06/11/2013 0354   BUN 9.2 08/21/2013 0831   BUN 13 06/11/2013 0354   CREATININE 0.7 08/21/2013 0831   CREATININE 0.63 06/11/2013 0354      Component Value Date/Time   CALCIUM 9.2 08/21/2013 0831   CALCIUM 8.4 06/11/2013 0354   ALKPHOS 54 08/21/2013 0831   ALKPHOS 67 06/08/2013 1315   AST 21 08/21/2013 0831   AST 45* 06/08/2013 1315   ALT 25 08/21/2013 0831   ALT 35 06/08/2013 1315   BILITOT 0.77 08/21/2013 0831   BILITOT 0.9 06/08/2013 1315  STUDIES: No results found.   ASSESSMENT: 32 y.o. [place], [place]:  1.  ITP, requiring recent hospitalization in June 2014. Patient was treated with IV dexamethasone and IVIG. She was then on a dexamethasone taper between 06/22/2013 and 07/12/2013. She received 6 weekly doses of rituximab, followed by 2 q. three-week doses, with the final dose given on 08/21/2013.  2. History of iron deficiency anemia, most recent dose of IV Feraheme was given in March 2013.  3. Acute pericarditis - resolved (the patient is still taking Colchicine daily)   PLAN: Veryl will receive her last scheduled dose of rituximab today as scheduled. She'll return for repeat labs and physical exam with Dr. Darnelle Catalan in approximately one month, and they will review her long-term followup plan at that time. In the meanwhile, of course she knows to call with any signs of abnormal bruising or bleeding.  She'll continue to be followed by Dr. Bryan Lemma for her recent history of acute pericarditis for which she continues on colchicine.  [place]anna voice understanding and agreement with the above plan. She'll call the problems prior to her next appointment.  Zollie Scale, PA-C 08/21/2013, 12:38 PM

## 2013-09-16 ENCOUNTER — Other Ambulatory Visit (HOSPITAL_BASED_OUTPATIENT_CLINIC_OR_DEPARTMENT_OTHER): Payer: BC Managed Care – PPO | Admitting: Lab

## 2013-09-16 ENCOUNTER — Telehealth: Payer: Self-pay | Admitting: Oncology

## 2013-09-16 ENCOUNTER — Ambulatory Visit (HOSPITAL_BASED_OUTPATIENT_CLINIC_OR_DEPARTMENT_OTHER): Payer: BC Managed Care – PPO | Admitting: Oncology

## 2013-09-16 VITALS — BP 121/82 | HR 62 | Temp 97.6°F | Resp 18 | Ht 70.0 in | Wt 271.4 lb

## 2013-09-16 DIAGNOSIS — D693 Immune thrombocytopenic purpura: Secondary | ICD-10-CM

## 2013-09-16 LAB — COMPREHENSIVE METABOLIC PANEL (CC13)
ALT: 22 U/L (ref 0–55)
AST: 18 U/L (ref 5–34)
Calcium: 9 mg/dL (ref 8.4–10.4)
Chloride: 107 mEq/L (ref 98–109)
Creatinine: 0.9 mg/dL (ref 0.6–1.1)
Sodium: 138 mEq/L (ref 136–145)
Total Bilirubin: 0.76 mg/dL (ref 0.20–1.20)
Total Protein: 6.8 g/dL (ref 6.4–8.3)

## 2013-09-16 LAB — CBC WITH DIFFERENTIAL/PLATELET
BASO%: 0.7 % (ref 0.0–2.0)
EOS%: 5.3 % (ref 0.0–7.0)
HCT: 37.3 % (ref 34.8–46.6)
MCH: 28 pg (ref 25.1–34.0)
MCHC: 33.4 g/dL (ref 31.5–36.0)
MONO#: 0.7 10*3/uL (ref 0.1–0.9)
NEUT%: 53.6 % (ref 38.4–76.8)
RBC: 4.44 10*6/uL (ref 3.70–5.45)
WBC: 6 10*3/uL (ref 3.9–10.3)
lymph#: 1.8 10*3/uL (ref 0.9–3.3)

## 2013-09-16 NOTE — Progress Notes (Signed)
Patient ID: Bailey Sheppard, female   DOB: 1981-03-05, 32 y.o.   MRN: [id number] [facility]  [phone]) [phone] [phone]  OFFICE PROGRESS NOTE   ID: Bailey Sheppard   DOB: 1981/01/17  MR#: [id number]  ZHY#:[id number]  PCP:  Bailey Sheppard, M.D. GYN: Bailey Morales, MD   HISTORY OF PRESENT ILLNESS: [name] is a  [place], [name] who moved from [place] with a documented history of ITP.  She was first diagnosed with ITP by a hematologist in [place] in 2007.  At that time she had a history of cytopenia with associated heavy menstrual bleeding.  She presented to our ED in 02/2012 with an unusual headache and bruises on different areas of her body.  She was seen by Dr. 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 The patient stated while living in [place], her blood counts had been up and down but she only received one course of high-dose prednisone in the prior 6 years. When she had been treated with oral steroids, she responded to the treatment.  Dr. Donnie Sheppard also noted absent iron stores. Her iron deficiency had been treated with IV iron multiple times.  She moved back to [place] in 2010,  but had not sought medical attention before presenting to the ED in 02/2012.   Dr. Donnie Sheppard treated Bailey Sheppard with a slow Prednisone taper for relapsed ITP.  She had been off steroids since 04/2012 and was being observed. Her platelet counts had normalized. She has a history of having  heavy menses but her menses have been scant if any since having an IUD placed in 04/2012.  Bailey Sheppard called the office of the Cancer Center on 06/08/2013 reporting new areas of bruising in all extremities, especially in thighs, gum bleeding when brushing her teeth, and blood clots from the left nostril. CBC laboratory results obtained at the [facility] revealed a platelet count of 5000.  Further  evaluation at the ED was recommended.  Bailey Sheppard was admitted to the hospital from 06/08/2013 - 06/12/2013 and was diagnosed with relapsed ITP and acute pericarditis.  While hospitalized, the patient received intravenous Dexamethasone and IVIG.  Her platelet counts had normalized to 259,000 following hospitalization.   INTERVAL HISTORY: Bailey Sheppard was seen today for follow up of ITP, prior to her final scheduled Rituxan infusion today.  She has tolerated the rituximab very well, with no infusion reactions. Her platelets have normalized. She completed her dexamethasone taper several weeks ago. Overall, interval history is unremarkable.  REVIEW OF SYSTEMS: Bailey Sheppard denies any recent illnesses. She's had no fevers or chills. She denies any signs whatsoever of abnormal bruising or bleeding. Her energy level is good, as is her appetite. She's had no nausea or change in bowel or bladder habits. She denies any cough, increased shortness of breath, or chest pain. She's had no abnormal headaches or dizziness, and currently denies any unusual myalgias, arthralgias, or bony pain.  A detailed review of systems is otherwise stable and noncontributory.   PAST MEDICAL HISTORY: Past Medical History  Diagnosis Date  . ITP (idiopathic thrombocytopenic purpura)   . Fibroids   . Acute pericarditis, unspecified 06/11/2013    PAST SURGICAL HISTORY: Past Surgical History  Procedure Laterality Date  . Umbilical hernia repair    . Intrauterine device (iud) insertion  04/2012    FAMILY HISTORY Family History  Problem Relation Age of Onset  . Hypertension Mother   .  Diabetes Father   . Lupus Cousin     GYNECOLOGIC HISTORY:   G3 P2 A1, menarche age 2,  parity age 12, she has a Mirena IUD in place.   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. They have 2 sons, aged 11 and 3.    ADVANCED DIRECTIVES:  Not on file  HEALTH MAINTENANCE: History  Substance Use Topics  . Smoking status: Never Smoker   . Smokeless tobacco: Never Used  . Alcohol Use: Yes     Comment: Rare    Colonoscopy: N/A PAP:  03/2012 Bone density:  N/A Lipid panel:  Not on file  No Known Allergies  Current Outpatient Prescriptions  Medication Sig Dispense Refill  . acetaminophen (TYLENOL) 500 MG tablet Take 500 mg by mouth every 6 (six) hours as needed for pain.      Marland Kitchen colchicine 0.6 MG tablet Take 1 tablet (0.6 mg total) by mouth 2 (two) times daily.  60 tablet  0  . omeprazole (PRILOSEC) 20 MG capsule Take 20 mg by mouth daily.      . RiTUXimab (RITUXAN IV) Inject into the vein.      Marland Kitchen traMADol (ULTRAM) 50 MG tablet Take 1 tablet (50 mg total) by mouth every 6 (six) hours as needed.  30 tablet  0   No current facility-administered medications for this visit.    OBJECTIVE: Filed Vitals:   09/16/13 1354  BP: 121/82  Pulse: 62  Temp: 97.6 F (36.4 C)  Resp: 18     Body mass index is 38.94 kg/(m^2).     Filed Weights   09/16/13 1354  Weight: 123.106 kg (271 lb 6.4 oz)   young African American female who appears comfortable and is in no acute distress  ECOG FS: 1 - Symptomatic but completely ambulatory    Physical Exam: HEENT:  Sclerae anicteric.  Oropharynx clear.  Nodes:  No cervical, supraclavicular or axillary lymphadenopathy palpated.  Breast Exam:  Deferred Lungs:  Clear to auscultation bilaterally. No rhonchi or wheezes  Heart:  Regular rate and rhythm.   Abdomen:  Soft, obese, nontender.  Positive bowel sounds.  Musculoskeletal:  No focal spinal tenderness to palpation.  Extremities:  No peripheral edema Neuro:  Nonfocal. Well oriented, friendly affect    LAB RESULTS: Lab Results  Component Value Date   WBC 6.0 09/16/2013   NEUTROABS 3.2 09/16/2013   HGB 12.5 09/16/2013   HCT 37.3 09/16/2013   MCV 84.0 09/16/2013    PLT 211 09/16/2013      Chemistry      Component Value Date/Time   NA 139 08/21/2013 0831   NA 133* 06/11/2013 0354   K 3.7 08/21/2013 0831   K 3.7 06/11/2013 0354   CL 105 06/19/2013 0845   CL 101 06/11/2013 0354   CO2 22 08/21/2013 0831   CO2 28 06/11/2013 0354   BUN 9.2 08/21/2013 0831   BUN 13 06/11/2013 0354   CREATININE 0.7 08/21/2013 0831   CREATININE 0.63 06/11/2013 0354      Component Value Date/Time   CALCIUM 9.2 08/21/2013 0831   CALCIUM 8.4 06/11/2013 0354   ALKPHOS 54 08/21/2013 0831   ALKPHOS 67 06/08/2013 1315   AST 21 08/21/2013 0831   AST 45* 06/08/2013 1315   ALT 25 08/21/2013 0831   ALT 35 06/08/2013 1315  BILITOT 0.77 08/21/2013 0831   BILITOT 0.9 06/08/2013 1315        STUDIES: No results found.   ASSESSMENT: 32 y.o. [place], [place]:  1.  ITP, requiring recent hospitalization in June 2014. Patient was treated with IV dexamethasone and IVIG. She was then on a dexamethasone taper between 06/22/2013 and 07/12/2013. She received 6 weekly doses of rituximab, followed by 2 q. three-week doses, with the final dose given on 08/21/2013.  2. History of iron deficiency anemia, most recent dose of IV Feraheme was given in March 2013.  3. Acute pericarditis - resolved (the patient is still taking Colchicine daily)   PLAN: Shreya will receive her last scheduled dose of rituximab today as scheduled. She'll return for repeat labs and physical exam with Dr. Darnelle Catalan in approximately one month, and they will review her long-term followup plan at that time. In the meanwhile, of course she knows to call with any signs of abnormal bruising or bleeding.  She'll continue to be followed by Dr. Bryan Lemma for her recent history of acute pericarditis for which she continues on colchicine.  Bailey Sheppard voice understanding and agreement with the above plan. She'll call the problems prior to her next appointment.  Bailey Scale, PA-C 09/16/2013, 2:17 PM

## 2013-09-16 NOTE — Progress Notes (Signed)
Patient ID: Bailey Sheppard, female   DOB: 02/13/1981, 32 y.o.   MRN: [id number] [facility]  Telephone:(336) [phone] [phone]  OFFICE PROGRESS NOTE   ID: Bailey Sheppard   DOB: February 11, 1981  MR#: [id number]  ZHY#:[id number]  PCP:  Anna Genre, M.D. GYN: Hal Morales, MD   HISTORY OF PRESENT ILLNESS: Bailey Sheppard is a  [place], [name] who moved from [place] with a documented history of ITP.  She was first diagnosed with ITP by a hematologist in [place] in 2007.  At that time she had a history of cytopenia with associated heavy menstrual bleeding.  She presented to our ED in 02/2012 with an unusual headache and bruises on different areas of her body.  She was seen by Dr. 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 The patient stated while living in [place], her blood counts had been up and down but she only received one course of high-dose prednisone in the prior 6 years. When she had been treated with oral steroids, she responded to the treatment.  Dr. Donnie Coffin also noted absent iron stores. Her iron deficiency had been treated with IV iron multiple times.  She moved back to [place] in 2010,  but had not sought medical attention before presenting to the ED in 02/2012.   Dr. Donnie Coffin treated Bailey Sheppard with a slow Prednisone taper for relapsed ITP.  She had been off steroids since 04/2012 and was being observed. Her platelet counts had normalized. She has a history of having  heavy menses but her menses have been scant if any since having an IUD placed in 04/2012.  Bailey Sheppard called the office of the Cancer Center on 06/08/2013 reporting new areas of bruising in all extremities, especially in thighs, gum bleeding when brushing her teeth, and blood clots from the left nostril. CBC laboratory results obtained at the [facility] revealed a platelet count of 5000.  Further  evaluation at the ED was recommended.  Bailey Sheppard was admitted to the hospital from 06/08/2013 - 06/12/2013 and was diagnosed with relapsed ITP and acute pericarditis.  While hospitalized, the patient received intravenous Dexamethasone and IVIG.  Her platelet counts had normalized to 259,000 following hospitalization.   INTERVAL HISTORY: Bailey Sheppard returns today for followup of her immune system is cytopenic purpura. The interval history is unremarkable. "Same job, same family".  REVIEW OF SYSTEMS: Bailey Sheppard specifically denies any fever, rash, or bleeding. She's not having periods since her IUD was placed. There have not been any arthralgias/myalgias, change in bowel or bladder habits, visual changes, nausea, or vomiting. Her headaches have resolved. A detailed review of systems today was otherwise noncontributory   PAST MEDICAL HISTORY: Past Medical History  Diagnosis Date  . ITP (idiopathic thrombocytopenic purpura)   . Fibroids   . Acute pericarditis, unspecified 06/11/2013    PAST SURGICAL HISTORY: Past Surgical History  Procedure Laterality Date  . Umbilical hernia repair    . Intrauterine device (iud) insertion  04/2012    FAMILY HISTORY Family History  Problem Relation Age of Onset  . Hypertension Mother   . Diabetes Father   . Lupus Cousin     GYNECOLOGIC HISTORY:   G3 P2 A1, menarche age 43,  parity age 19, she has a Mirena IUD in place.   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 They have 2 sons, aged 68 and 3.    ADVANCED DIRECTIVES:  Not on file  HEALTH MAINTENANCE: History  Substance Use Topics  . Smoking status: Never Smoker   . Smokeless tobacco: Never Used  . Alcohol Use: Yes     Comment: Rare    Colonoscopy: N/A PAP:  03/2012 Bone density:  N/A Lipid panel:  Not on file  No Known  Allergies  Current Outpatient Prescriptions  Medication Sig Dispense Refill  . acetaminophen (TYLENOL) 500 MG tablet Take 500 mg by mouth every 6 (six) hours as needed for pain.      Marland Kitchen colchicine 0.6 MG tablet Take 1 tablet (0.6 mg total) by mouth 2 (two) times daily.  60 tablet  0  . omeprazole (PRILOSEC) 20 MG capsule Take 20 mg by mouth daily.      . RiTUXimab (RITUXAN IV) Inject into the vein.      Marland Kitchen traMADol (ULTRAM) 50 MG tablet Take 1 tablet (50 mg total) by mouth every 6 (six) hours as needed.  30 tablet  0   No current facility-administered medications for this visit.    OBJECTIVE: Young African American woman who looks well Filed Vitals:   09/16/13 1354  BP: 121/82  Pulse: 62  Temp: 97.6 F (36.4 C)  Resp: 18     Body mass index is 38.94 kg/(m^2).     Filed Weights   09/16/13 1354  Weight: 123.106 kg (271 lb 6.4 oz)   young African American female who appears comfortable and is in no acute distress  ECOG FS: 0 - Asymptomatic    Physical Exam: HEENT:  Sclerae anicteric.  Oropharynx clear.  Nodes:  No cervical, supraclavicular or axillary lymphadenopathy palpated.  Breast Exam:  Deferred Lungs:  Clear to auscultation bilaterally. No rhonchi or wheezes  Heart:  Regular rate and rhythm.   Abdomen:  Soft, obese, nontender.  Positive bowel sounds. No splenomegaly Musculoskeletal:  No focal spinal tenderness to palpation.  Extremities:  No peripheral edema Neuro:  Nonfocal. Well oriented, friendly affect    LAB RESULTS: Lab Results  Component Value Date   WBC 6.0 09/16/2013   NEUTROABS 3.2 09/16/2013   HGB 12.5 09/16/2013   HCT 37.3 09/16/2013   MCV 84.0 09/16/2013   PLT 211 09/16/2013      Chemistry      Component Value Date/Time   NA 139 08/21/2013 0831   NA 133* 06/11/2013 0354   K 3.7 08/21/2013 0831   K 3.7 06/11/2013 0354   CL 105 06/19/2013 0845   CL 101 06/11/2013 0354   CO2 22 08/21/2013 0831   CO2 28 06/11/2013 0354   BUN 9.2 08/21/2013 0831   BUN 13  06/11/2013 0354   CREATININE 0.7 08/21/2013 0831   CREATININE 0.63 06/11/2013 0354      Component Value Date/Time   CALCIUM 9.2 08/21/2013 0831   CALCIUM 8.4 06/11/2013 0354   ALKPHOS 54 08/21/2013 0831   ALKPHOS 67 06/08/2013 1315   AST 21 08/21/2013 0831   AST 45* 06/08/2013 1315   ALT 25 08/21/2013 0831   ALT 35 06/08/2013 1315   BILITOT 0.77 08/21/2013 0831   BILITOT 0.9 06/08/2013 1315        STUDIES: No results found.   ASSESSMENT: 32 y.o. [place], [place]:  1.  ITP, requiring recent hospitalization in June 2014. Patient was treated with IV dexamethasone and IVIG. She was then  on a dexamethasone taper between 06/22/2013 and 07/12/2013. She received 6 weekly doses of rituximab, followed by 2 q. three-week doses, with the final dose given on 08/21/2013.  2. History of iron deficiency anemia, most recent dose of IV Feraheme was given in March 2013.  3. Acute pericarditis - resolved (the patient is still taking Colchicine daily)   PLAN: Bailey Sheppard is doing fine, with no evidence of either worsening anemia or recurrent ITP. We are going to start monitoring her labs every 3 months, and I will see her again in one year. If everything continues well I will probably release her from followup at that time.  I have explained that it is fine for her to receive a flu shot, but since she got right toxin she probably should not receive any live vaccines. She should just call us if she has any questions regarding that.  Lowella Dell, MD   09/16/2013, 2:14 PM

## 2013-09-16 NOTE — Telephone Encounter (Signed)
, °

## 2013-10-16 DIAGNOSIS — R200 Anesthesia of skin: Secondary | ICD-10-CM | POA: Insufficient documentation

## 2013-11-05 ENCOUNTER — Other Ambulatory Visit: Payer: Self-pay

## 2013-12-11 ENCOUNTER — Other Ambulatory Visit: Payer: Self-pay | Admitting: Physician Assistant

## 2013-12-11 DIAGNOSIS — D693 Immune thrombocytopenic purpura: Secondary | ICD-10-CM

## 2013-12-11 DIAGNOSIS — E871 Hypo-osmolality and hyponatremia: Secondary | ICD-10-CM

## 2013-12-14 ENCOUNTER — Other Ambulatory Visit (HOSPITAL_BASED_OUTPATIENT_CLINIC_OR_DEPARTMENT_OTHER): Payer: BC Managed Care – PPO

## 2013-12-14 DIAGNOSIS — E871 Hypo-osmolality and hyponatremia: Secondary | ICD-10-CM

## 2013-12-14 DIAGNOSIS — D693 Immune thrombocytopenic purpura: Secondary | ICD-10-CM

## 2013-12-14 LAB — COMPREHENSIVE METABOLIC PANEL (CC13)
ALT: 33 U/L (ref 0–55)
AST: 24 U/L (ref 5–34)
Alkaline Phosphatase: 77 U/L (ref 40–150)
Anion Gap: 8 mEq/L (ref 3–11)
Creatinine: 0.7 mg/dL (ref 0.6–1.1)
Total Bilirubin: 0.75 mg/dL (ref 0.20–1.20)

## 2013-12-14 LAB — CBC WITH DIFFERENTIAL/PLATELET
BASO%: 0.3 % (ref 0.0–2.0)
EOS%: 5.4 % (ref 0.0–7.0)
HCT: 38.1 % (ref 34.8–46.6)
LYMPH%: 27.1 % (ref 14.0–49.7)
MCH: 27.7 pg (ref 25.1–34.0)
MCHC: 33.4 g/dL (ref 31.5–36.0)
NEUT%: 53.9 % (ref 38.4–76.8)
Platelets: 209 10*3/uL (ref 145–400)
RBC: 4.6 10*6/uL (ref 3.70–5.45)

## 2013-12-15 ENCOUNTER — Telehealth: Payer: Self-pay | Admitting: *Deleted

## 2013-12-15 NOTE — Telephone Encounter (Signed)
Returned patient's call, Wanted to know platelet results.Left message and told her to call back if she has questions.

## 2014-01-01 ENCOUNTER — Other Ambulatory Visit: Payer: Self-pay | Admitting: *Deleted

## 2014-01-01 DIAGNOSIS — D693 Immune thrombocytopenic purpura: Secondary | ICD-10-CM

## 2014-01-04 ENCOUNTER — Other Ambulatory Visit (HOSPITAL_BASED_OUTPATIENT_CLINIC_OR_DEPARTMENT_OTHER): Payer: BC Managed Care – PPO

## 2014-01-04 DIAGNOSIS — D693 Immune thrombocytopenic purpura: Secondary | ICD-10-CM

## 2014-01-04 LAB — CBC WITH DIFFERENTIAL/PLATELET
BASO%: 0.4 % (ref 0.0–2.0)
Basophils Absolute: 0 10*3/uL (ref 0.0–0.1)
EOS ABS: 0.4 10*3/uL (ref 0.0–0.5)
EOS%: 4.9 % (ref 0.0–7.0)
HCT: 38.6 % (ref 34.8–46.6)
HGB: 12.9 g/dL (ref 11.6–15.9)
LYMPH%: 22 % (ref 14.0–49.7)
MCH: 27.8 pg (ref 25.1–34.0)
MCHC: 33.5 g/dL (ref 31.5–36.0)
MCV: 82.9 fL (ref 79.5–101.0)
MONO#: 0.6 10*3/uL (ref 0.1–0.9)
MONO%: 8.2 % (ref 0.0–14.0)
NEUT%: 64.5 % (ref 38.4–76.8)
NEUTROS ABS: 4.8 10*3/uL (ref 1.5–6.5)
Platelets: 197 10*3/uL (ref 145–400)
RBC: 4.65 10*6/uL (ref 3.70–5.45)
RDW: 15.8 % — AB (ref 11.2–14.5)
WBC: 7.4 10*3/uL (ref 3.9–10.3)
lymph#: 1.6 10*3/uL (ref 0.9–3.3)

## 2014-01-21 ENCOUNTER — Encounter (HOSPITAL_COMMUNITY): Payer: Self-pay | Admitting: Emergency Medicine

## 2014-01-21 ENCOUNTER — Emergency Department (HOSPITAL_COMMUNITY): Payer: BC Managed Care – PPO

## 2014-01-21 ENCOUNTER — Emergency Department (HOSPITAL_COMMUNITY)
Admission: EM | Admit: 2014-01-21 | Discharge: 2014-01-22 | Disposition: A | Discharge status: Home / Self Care | Payer: BC Managed Care – PPO | Attending: Emergency Medicine | Admitting: Emergency Medicine

## 2014-01-21 DIAGNOSIS — Z3202 Encounter for pregnancy test, result negative: Secondary | ICD-10-CM | POA: Insufficient documentation

## 2014-01-21 DIAGNOSIS — D219 Benign neoplasm of connective and other soft tissue, unspecified: Secondary | ICD-10-CM

## 2014-01-21 DIAGNOSIS — Z862 Personal history of diseases of the blood and blood-forming organs and certain disorders involving the immune mechanism: Secondary | ICD-10-CM | POA: Insufficient documentation

## 2014-01-21 DIAGNOSIS — N949 Unspecified condition associated with female genital organs and menstrual cycle: Secondary | ICD-10-CM | POA: Insufficient documentation

## 2014-01-21 DIAGNOSIS — R102 Pelvic and perineal pain: Secondary | ICD-10-CM

## 2014-01-21 DIAGNOSIS — D259 Leiomyoma of uterus, unspecified: Secondary | ICD-10-CM | POA: Insufficient documentation

## 2014-01-21 DIAGNOSIS — Z8679 Personal history of other diseases of the circulatory system: Secondary | ICD-10-CM | POA: Insufficient documentation

## 2014-01-21 LAB — CBC WITH DIFFERENTIAL/PLATELET
BASOS ABS: 0 10*3/uL (ref 0.0–0.1)
BASOS PCT: 0 % (ref 0–1)
EOS PCT: 3 % (ref 0–5)
Eosinophils Absolute: 0.3 10*3/uL (ref 0.0–0.7)
HCT: 36.2 % (ref 36.0–46.0)
Hemoglobin: 12.3 g/dL (ref 12.0–15.0)
Lymphocytes Relative: 17 % (ref 12–46)
Lymphs Abs: 1.8 10*3/uL (ref 0.7–4.0)
MCH: 27.7 pg (ref 26.0–34.0)
MCHC: 34 g/dL (ref 30.0–36.0)
MCV: 81.5 fL (ref 78.0–100.0)
Monocytes Absolute: 0.9 10*3/uL (ref 0.1–1.0)
Monocytes Relative: 9 % (ref 3–12)
Neutro Abs: 7.6 10*3/uL (ref 1.7–7.7)
Neutrophils Relative %: 72 % (ref 43–77)
Platelets: 205 10*3/uL (ref 150–400)
RBC: 4.44 MIL/uL (ref 3.87–5.11)
RDW: 15.2 % (ref 11.5–15.5)
WBC: 10.6 10*3/uL — ABNORMAL HIGH (ref 4.0–10.5)

## 2014-01-21 LAB — URINALYSIS, ROUTINE W REFLEX MICROSCOPIC
Bilirubin Urine: NEGATIVE
GLUCOSE, UA: NEGATIVE mg/dL
HGB URINE DIPSTICK: NEGATIVE
Ketones, ur: NEGATIVE mg/dL
LEUKOCYTES UA: NEGATIVE
Nitrite: NEGATIVE
Protein, ur: NEGATIVE mg/dL
SPECIFIC GRAVITY, URINE: 1.022 (ref 1.005–1.030)
Urobilinogen, UA: 0.2 mg/dL (ref 0.0–1.0)
pH: 6 (ref 5.0–8.0)

## 2014-01-21 LAB — COMPREHENSIVE METABOLIC PANEL
ALBUMIN: 4 g/dL (ref 3.5–5.2)
ALT: 12 U/L (ref 0–35)
AST: 12 U/L (ref 0–37)
Alkaline Phosphatase: 67 U/L (ref 39–117)
BUN: 10 mg/dL (ref 6–23)
CO2: 24 mEq/L (ref 19–32)
CREATININE: 0.7 mg/dL (ref 0.50–1.10)
Calcium: 9 mg/dL (ref 8.4–10.5)
Chloride: 101 mEq/L (ref 96–112)
GFR calc Af Amer: >90 mL/min (ref >90)
GFR calc non Af Amer: >90 mL/min (ref >90)
Glucose, Bld: 90 mg/dL (ref 70–99)
Potassium: 3.7 mEq/L (ref 3.7–5.3)
Sodium: 138 mEq/L (ref 137–147)
Total Bilirubin: 0.6 mg/dL (ref 0.3–1.2)
Total Protein: 7.2 g/dL (ref 6.0–8.3)

## 2014-01-21 LAB — POCT PREGNANCY, URINE: Preg Test, Ur: NEGATIVE

## 2014-01-21 MED ORDER — IBUPROFEN 800 MG PO TABS: 800.0000 mg | ORAL_TABLET | Freq: Three times a day (TID) | ORAL | Status: DC | PRN

## 2014-01-21 NOTE — Discharge Instructions (Signed)
Your IUD is still in place but may be not in the appropriate position. Follow up with your doctor or the clinic provided. You have a fibroid on your uterus as well.

## 2014-01-21 NOTE — ED Provider Notes (Signed)
CSN: [id number]     Arrival date & time 01/21/14  1824 History   First MD Initiated Contact with Patient 01/21/14 2015     Chief Complaint  Patient presents with  . Abdominal Pain   (Consider location/radiation/quality/duration/timing/severity/associated sxs/prior Treatment) HPI Patient presents to the emergency department with lower abdominal pain.  Patient, states she has an IUD in place, but started having spotting 2 days, ago, states she's not had a normal menstrual cycle since her IUD insertion.  Patient, states, that she is no longer bleeding, but having lower pelvic pain.  Patient, states she did not have any vaginal discharge, chest pain, shortness of breath, nausea, vomiting, diarrhea, headache, blurred vision, back pain, dysuria, weakness, dizziness, fever, or syncope.  Patient, states, that nothing seems to make her condition, better or worse.  Patient, states she did not take any medications prior to arrival other than Tylenol. Past Medical History  Diagnosis Date  . ITP (idiopathic thrombocytopenic purpura)   . Fibroids   . Acute pericarditis, unspecified 06/11/2013   Past Surgical History  Procedure Laterality Date  . Umbilical hernia repair    . Intrauterine device (iud) insertion  04/2012   Family History  Problem Relation Age of Onset  . Hypertension Mother   . Diabetes Father   . Lupus Cousin    History  Substance Use Topics  . Smoking status: Never Smoker   . Smokeless tobacco: Never Used  . Alcohol Use: Yes     Comment: Rare   OB History   Grav Para Term Preterm Abortions TAB SAB Ect Mult Living                 Review of Systems All other systems negative except as documented in the HPI. All pertinent positives and negatives as reviewed in the HPI. Allergies  Review of patient's allergies indicates no known allergies.  Home Medications   Current Outpatient Rx  Name  Route  Sig  Dispense  Refill  . acetaminophen (TYLENOL) 500 MG tablet   Oral   Take  1,000 mg by mouth every 6 (six) hours as needed for pain.          Marland Kitchen levonorgestrel (MIRENA) 20 MCG/24HR IUD   Intrauterine   1 each by Intrauterine route once.          BP 138/85  Pulse 93  Temp(Src) 98.3 F (36.8 C) (Oral)  Resp 18  Ht 5\' 10"  (1.778 m)  Wt 260 lb (117.935 kg)  BMI 37.31 kg/m2  SpO2 99% Physical Exam  Nursing note and vitals reviewed. Constitutional: She is oriented to person, place, and time. She appears well-developed and well-nourished. No distress.  HENT:  Head: Normocephalic and atraumatic.  Mouth/Throat: Oropharynx is clear and moist.  Eyes: Pupils are equal, round, and reactive to light.  Neck: Normal range of motion. Neck supple.  Cardiovascular: Normal rate, regular rhythm and normal heart sounds.  Exam reveals no gallop and no friction rub.   No murmur heard. Pulmonary/Chest: Effort normal and breath sounds normal. No respiratory distress.  Abdominal: Soft. Normal appearance and bowel sounds are normal. She exhibits no distension. There is no rigidity, no rebound, no guarding and negative Murphy's sign.    Genitourinary: Vagina normal. No vaginal discharge found.  Neurological: She is alert and oriented to person, place, and time.  Skin: Skin is warm and dry.    ED Course  Procedures (including critical care time) Labs Review Labs Reviewed  CBC WITH DIFFERENTIAL -  Abnormal; Notable for the following:    WBC 10.6 (*)    All other components within normal limits  COMPREHENSIVE METABOLIC PANEL  URINALYSIS, ROUTINE W REFLEX MICROSCOPIC  POCT PREGNANCY, URINE   Imaging Review US Transvaginal Non-ob  01/21/2014   CLINICAL DATA:  Right lower quadrant pain. Rule out torsion. Intrauterine device.  EXAM: TRANSABDOMINAL AND TRANSVAGINAL ULTRASOUND OF PELVIS  DOPPLER ULTRASOUND OF OVARIES  TECHNIQUE: Both transabdominal and transvaginal ultrasound examinations of the pelvis were performed. Transabdominal technique was performed for global imaging of  the pelvis including uterus, ovaries, adnexal regions, and pelvic cul-de-sac.  It was necessary to proceed with endovaginal exam following the transabdominal exam to visualize the uterus, ovaries, and adnexa. Color and duplex Doppler ultrasound was utilized to evaluate blood flow to the ovaries.  COMPARISON:  No comparisons  FINDINGS: Uterus: 10.0 X 5.8 X 8.4 CM. A right-sided uterine fibroid measures 4.8 X 4.2 X 6.6 CM.  Endometrium: Up to 14 mm. Endometrial echogenicity without vascularity centrally, likely due to blood products. Example 1.7 cm on image 36. Probable intrauterine device in the lower uterine segment, including on image 30. Not definitely identified more superiorly.  Right Ovary: 5.1 x 2.6 x 2.9 cm. Normal in morphology. Normal color Doppler signal.  Left Ovary: 3.4 x 1.9 x 2.0 cm. Normal in morphology. Normal color Doppler signal.  Pulsed Doppler evaluation of both ovaries demonstrates normal low-resistance arterial and venous waveforms.  Other findings  Trace free pelvic fluid is likely physiologic.  IMPRESSION: 1. No evidence of ovarian or adnexal torsion. 2. Right-sided uterine body fibroid. 3. Mildly prominent endometrium with central irregular echogenicity. Favor blood products/clot. Consider followup in the week following the patient's menses to confirm resolution. 4. The clinical history describes an IUD. There may be intrauterine device in the lower uterine segment. No IUD seen more superiorly. Findings are suspicious for in appropriate position.   Electronically Signed   By: Abigail Miyamoto M.D.   On: 01/21/2014 22:46   US Pelvis Complete  01/21/2014   CLINICAL DATA:  Right lower quadrant pain. Rule out torsion. Intrauterine device.  EXAM: TRANSABDOMINAL AND TRANSVAGINAL ULTRASOUND OF PELVIS  DOPPLER ULTRASOUND OF OVARIES  TECHNIQUE: Both transabdominal and transvaginal ultrasound examinations of the pelvis were performed. Transabdominal technique was performed for global imaging of the  pelvis including uterus, ovaries, adnexal regions, and pelvic cul-de-sac.  It was necessary to proceed with endovaginal exam following the transabdominal exam to visualize the uterus, ovaries, and adnexa. Color and duplex Doppler ultrasound was utilized to evaluate blood flow to the ovaries.  COMPARISON:  No comparisons  FINDINGS: Uterus: 10.0 X 5.8 X 8.4 CM. A right-sided uterine fibroid measures 4.8 X 4.2 X 6.6 CM.  Endometrium: Up to 14 mm. Endometrial echogenicity without vascularity centrally, likely due to blood products. Example 1.7 cm on image 36. Probable intrauterine device in the lower uterine segment, including on image 30. Not definitely identified more superiorly.  Right Ovary: 5.1 x 2.6 x 2.9 cm. Normal in morphology. Normal color Doppler signal.  Left Ovary: 3.4 x 1.9 x 2.0 cm. Normal in morphology. Normal color Doppler signal.  Pulsed Doppler evaluation of both ovaries demonstrates normal low-resistance arterial and venous waveforms.  Other findings  Trace free pelvic fluid is likely physiologic.  IMPRESSION: 1. No evidence of ovarian or adnexal torsion. 2. Right-sided uterine body fibroid. 3. Mildly prominent endometrium with central irregular echogenicity. Favor blood products/clot. Consider followup in the week following the patient's menses to confirm resolution. 4.  The clinical history describes an IUD. There may be intrauterine device in the lower uterine segment. No IUD seen more superiorly. Findings are suspicious for in appropriate position.   Electronically Signed   By: Abigail Miyamoto M.D.   On: 01/21/2014 22:46   [place] Art/ven Flow Abd Pelv Doppler  01/21/2014   CLINICAL DATA:  Right lower quadrant pain. Rule out torsion. Intrauterine device.  EXAM: TRANSABDOMINAL AND TRANSVAGINAL ULTRASOUND OF PELVIS  DOPPLER ULTRASOUND OF OVARIES  TECHNIQUE: Both transabdominal and transvaginal ultrasound examinations of the pelvis were performed. Transabdominal technique was performed for global imaging  of the pelvis including uterus, ovaries, adnexal regions, and pelvic cul-de-sac.  It was necessary to proceed with endovaginal exam following the transabdominal exam to visualize the uterus, ovaries, and adnexa. Color and duplex Doppler ultrasound was utilized to evaluate blood flow to the ovaries.  COMPARISON:  No comparisons  FINDINGS: Uterus: 10.0 X 5.8 X 8.4 CM. A right-sided uterine fibroid measures 4.8 X 4.2 X 6.6 CM.  Endometrium: Up to 14 mm. Endometrial echogenicity without vascularity centrally, likely due to blood products. Example 1.7 cm on image 36. Probable intrauterine device in the lower uterine segment, including on image 30. Not definitely identified more superiorly.  Right Ovary: 5.1 x 2.6 x 2.9 cm. Normal in morphology. Normal color Doppler signal.  Left Ovary: 3.4 x 1.9 x 2.0 cm. Normal in morphology. Normal color Doppler signal.  Pulsed Doppler evaluation of both ovaries demonstrates normal low-resistance arterial and venous waveforms.  Other findings  Trace free pelvic fluid is likely physiologic.  IMPRESSION: 1. No evidence of ovarian or adnexal torsion. 2. Right-sided uterine body fibroid. 3. Mildly prominent endometrium with central irregular echogenicity. Favor blood products/clot. Consider followup in the week following the patient's menses to confirm resolution. 4. The clinical history describes an IUD. There may be intrauterine device in the lower uterine segment. No IUD seen more superiorly. Findings are suspicious for in appropriate position.   Electronically Signed   By: Abigail Miyamoto M.D.   On: 01/21/2014 22:46   Return here as needed. Follow up with your GYN or the one provided.  The patient has what appears to be a fibroid in the region where she is having her pain. The patient has no history of such. The patient is stable and agrees to the [place], PA-C 01/25/14 [phone]

## 2014-01-21 NOTE — ED Notes (Signed)
Pt c/o rt lower abd pain x 2 days; pt reports that she has an IUD in place and had spotting when pain began; denies spotting currently; denies nausea or vomiting; pt denies vaginal discharge; pt has an appt with her physician on Monday but feels like she cannot wait until then.

## 2014-01-22 ENCOUNTER — Other Ambulatory Visit: Payer: Self-pay | Admitting: Physician Assistant

## 2014-01-22 DIAGNOSIS — D693 Immune thrombocytopenic purpura: Secondary | ICD-10-CM

## 2014-01-22 DIAGNOSIS — D509 Iron deficiency anemia, unspecified: Secondary | ICD-10-CM

## 2014-01-22 DIAGNOSIS — E871 Hypo-osmolality and hyponatremia: Secondary | ICD-10-CM

## 2014-01-22 DIAGNOSIS — R748 Abnormal levels of other serum enzymes: Secondary | ICD-10-CM

## 2014-01-22 DIAGNOSIS — R739 Hyperglycemia, unspecified: Secondary | ICD-10-CM

## 2014-01-25 ENCOUNTER — Other Ambulatory Visit (HOSPITAL_BASED_OUTPATIENT_CLINIC_OR_DEPARTMENT_OTHER): Payer: BC Managed Care – PPO

## 2014-01-25 DIAGNOSIS — D509 Iron deficiency anemia, unspecified: Secondary | ICD-10-CM

## 2014-01-25 DIAGNOSIS — D693 Immune thrombocytopenic purpura: Secondary | ICD-10-CM

## 2014-01-25 DIAGNOSIS — E871 Hypo-osmolality and hyponatremia: Secondary | ICD-10-CM

## 2014-01-25 DIAGNOSIS — R739 Hyperglycemia, unspecified: Secondary | ICD-10-CM

## 2014-01-25 LAB — COMPREHENSIVE METABOLIC PANEL (CC13)
ALBUMIN: 3.7 g/dL (ref 3.5–5.0)
ALT: 12 U/L (ref 0–55)
ANION GAP: 9 meq/L (ref 3–11)
AST: 10 U/L (ref 5–34)
Alkaline Phosphatase: 64 U/L (ref 40–150)
BUN: 6.1 mg/dL — ABNORMAL LOW (ref 7.0–26.0)
CHLORIDE: 103 meq/L (ref 98–109)
CO2: 26 mEq/L (ref 22–29)
Calcium: 9.4 mg/dL (ref 8.4–10.4)
Creatinine: 0.7 mg/dL (ref 0.6–1.1)
Glucose: 95 mg/dl (ref 70–140)
POTASSIUM: 3.7 meq/L (ref 3.5–5.1)
SODIUM: 138 meq/L (ref 136–145)
TOTAL PROTEIN: 7.1 g/dL (ref 6.4–8.3)
Total Bilirubin: 0.88 mg/dL (ref 0.20–1.20)

## 2014-01-25 LAB — CBC WITH DIFFERENTIAL/PLATELET
BASO%: 0.4 % (ref 0.0–2.0)
Basophils Absolute: 0 10*3/uL (ref 0.0–0.1)
EOS%: 2.1 % (ref 0.0–7.0)
Eosinophils Absolute: 0.2 10*3/uL (ref 0.0–0.5)
HEMATOCRIT: 37.5 % (ref 34.8–46.6)
HGB: 12.6 g/dL (ref 11.6–15.9)
LYMPH#: 1.7 10*3/uL (ref 0.9–3.3)
LYMPH%: 18.4 % (ref 14.0–49.7)
MCH: 28 pg (ref 25.1–34.0)
MCHC: 33.4 g/dL (ref 31.5–36.0)
MCV: 83.6 fL (ref 79.5–101.0)
MONO#: 0.9 10*3/uL (ref 0.1–0.9)
MONO%: 9.9 % (ref 0.0–14.0)
NEUT#: 6.4 10*3/uL (ref 1.5–6.5)
NEUT%: 69.2 % (ref 38.4–76.8)
Platelets: 242 10*3/uL (ref 145–400)
RBC: 4.49 10*6/uL (ref 3.70–5.45)
RDW: 15.1 % — AB (ref 11.2–14.5)
WBC: 9.2 10*3/uL (ref 3.9–10.3)

## 2014-01-25 NOTE — ED Provider Notes (Signed)
Medical screening examination/treatment/procedure(s) were performed by non-physician practitioner and as supervising physician I was immediately available for consultation/collaboration.  EKG Interpretation   None        Jasper Riling. Alvino Chapel, MD 01/25/14 [phone]

## 2014-01-26 ENCOUNTER — Telehealth: Payer: Self-pay | Admitting: *Deleted

## 2014-01-26 NOTE — Telephone Encounter (Signed)
Faxed labs to Dr Hamdi Vari Sou office. Dr Jana Hakim states pt is ok for dental extractions-platelets and wbc are normal.

## 2014-03-15 ENCOUNTER — Other Ambulatory Visit (HOSPITAL_BASED_OUTPATIENT_CLINIC_OR_DEPARTMENT_OTHER): Payer: BC Managed Care – PPO

## 2014-03-15 DIAGNOSIS — R739 Hyperglycemia, unspecified: Secondary | ICD-10-CM

## 2014-03-15 DIAGNOSIS — D509 Iron deficiency anemia, unspecified: Secondary | ICD-10-CM

## 2014-03-15 DIAGNOSIS — E871 Hypo-osmolality and hyponatremia: Secondary | ICD-10-CM

## 2014-03-15 DIAGNOSIS — D693 Immune thrombocytopenic purpura: Secondary | ICD-10-CM

## 2014-03-15 LAB — CBC WITH DIFFERENTIAL/PLATELET
BASO%: 0.2 % (ref 0.0–2.0)
BASOS ABS: 0 10*3/uL (ref 0.0–0.1)
EOS%: 5.2 % (ref 0.0–7.0)
Eosinophils Absolute: 0.4 10*3/uL (ref 0.0–0.5)
HCT: 37.6 % (ref 34.8–46.6)
HEMOGLOBIN: 12.2 g/dL (ref 11.6–15.9)
LYMPH%: 24.9 % (ref 14.0–49.7)
MCH: 26.8 pg (ref 25.1–34.0)
MCHC: 32.5 g/dL (ref 31.5–36.0)
MCV: 82.6 fL (ref 79.5–101.0)
MONO#: 0.6 10*3/uL (ref 0.1–0.9)
MONO%: 8.9 % (ref 0.0–14.0)
NEUT#: 4.2 10*3/uL (ref 1.5–6.5)
NEUT%: 60.8 % (ref 38.4–76.8)
Platelets: 203 10*3/uL (ref 145–400)
RBC: 4.55 10*6/uL (ref 3.70–5.45)
RDW: 16.2 % — ABNORMAL HIGH (ref 11.2–14.5)
WBC: 6.9 10*3/uL (ref 3.9–10.3)
lymph#: 1.7 10*3/uL (ref 0.9–3.3)

## 2014-03-15 LAB — COMPREHENSIVE METABOLIC PANEL (CC13)
ALK PHOS: 70 U/L (ref 40–150)
ALT: 18 U/L (ref 0–55)
AST: 15 U/L (ref 5–34)
Albumin: 4.1 g/dL (ref 3.5–5.0)
Anion Gap: 9 mEq/L (ref 3–11)
BUN: 9.8 mg/dL (ref 7.0–26.0)
CALCIUM: 9.7 mg/dL (ref 8.4–10.4)
CO2: 25 mEq/L (ref 22–29)
CREATININE: 0.8 mg/dL (ref 0.6–1.1)
Chloride: 106 mEq/L (ref 98–109)
Glucose: 97 mg/dl (ref 70–140)
POTASSIUM: 3.8 meq/L (ref 3.5–5.1)
Sodium: 141 mEq/L (ref 136–145)
Total Bilirubin: 0.69 mg/dL (ref 0.20–1.20)
Total Protein: 7.2 g/dL (ref 6.4–8.3)

## 2014-04-12 ENCOUNTER — Telehealth: Payer: Self-pay | Admitting: Oncology

## 2014-04-12 ENCOUNTER — Other Ambulatory Visit: Payer: Self-pay | Admitting: *Deleted

## 2014-04-12 DIAGNOSIS — D693 Immune thrombocytopenic purpura: Secondary | ICD-10-CM

## 2014-04-12 DIAGNOSIS — H3509 Other intraretinal microvascular abnormalities: Secondary | ICD-10-CM

## 2014-04-12 NOTE — Telephone Encounter (Signed)
s/w pt re appt w/dr christine mccuen 04/14/14 ! 9:15am - [phone] - [address] no other orders per 4/13 pof.

## 2014-04-12 NOTE — Progress Notes (Signed)
Pt called to this RN to state she has " an area of blood in my left eye that I have had for almost 3 weeks ". " I went to my primary MD when I first noticed this and she said she thought it was pink eye and gave me medication- but it hasn't gotten any better and it doesn't itch ".  Pt is concerned about history of ITP- " but my platelets were good last month " ( greater then 200).  Per call Kayleena would like to see an opthamologist and is asking for a referral.  Per MD referral placed for Dr Luberta Mutter and POF sent to scheduling.

## 2014-06-14 ENCOUNTER — Other Ambulatory Visit (HOSPITAL_BASED_OUTPATIENT_CLINIC_OR_DEPARTMENT_OTHER): Payer: BC Managed Care – PPO

## 2014-06-14 DIAGNOSIS — E871 Hypo-osmolality and hyponatremia: Secondary | ICD-10-CM

## 2014-06-14 DIAGNOSIS — D693 Immune thrombocytopenic purpura: Secondary | ICD-10-CM

## 2014-06-14 DIAGNOSIS — D509 Iron deficiency anemia, unspecified: Secondary | ICD-10-CM

## 2014-06-14 DIAGNOSIS — R739 Hyperglycemia, unspecified: Secondary | ICD-10-CM

## 2014-06-14 LAB — CBC WITH DIFFERENTIAL/PLATELET
BASO%: 0.1 % (ref 0.0–2.0)
BASOS ABS: 0 10*3/uL (ref 0.0–0.1)
EOS%: 2.5 % (ref 0.0–7.0)
Eosinophils Absolute: 0.2 10*3/uL (ref 0.0–0.5)
HCT: 37.2 % (ref 34.8–46.6)
HEMOGLOBIN: 12.6 g/dL (ref 11.6–15.9)
LYMPH%: 21.4 % (ref 14.0–49.7)
MCH: 27 pg (ref 25.1–34.0)
MCHC: 33.9 g/dL (ref 31.5–36.0)
MCV: 79.8 fL (ref 79.5–101.0)
MONO#: 0.7 10*3/uL (ref 0.1–0.9)
MONO%: 10.1 % (ref 0.0–14.0)
NEUT#: 4.4 10*3/uL (ref 1.5–6.5)
NEUT%: 65.9 % (ref 38.4–76.8)
PLATELETS: 225 10*3/uL (ref 145–400)
RBC: 4.66 10*6/uL (ref 3.70–5.45)
RDW: 15.5 % — ABNORMAL HIGH (ref 11.2–14.5)
WBC: 6.7 10*3/uL (ref 3.9–10.3)
lymph#: 1.4 10*3/uL (ref 0.9–3.3)

## 2014-06-14 LAB — COMPREHENSIVE METABOLIC PANEL (CC13)
ALK PHOS: 66 U/L (ref 40–150)
ALT: 16 U/L (ref 0–55)
AST: 16 U/L (ref 5–34)
Albumin: 3.9 g/dL (ref 3.5–5.0)
Anion Gap: 8 mEq/L (ref 3–11)
BILIRUBIN TOTAL: 0.68 mg/dL (ref 0.20–1.20)
BUN: 11.6 mg/dL (ref 7.0–26.0)
CO2: 23 mEq/L (ref 22–29)
CREATININE: 0.8 mg/dL (ref 0.6–1.1)
Calcium: 9 mg/dL (ref 8.4–10.4)
Chloride: 106 mEq/L (ref 98–109)
GLUCOSE: 118 mg/dL (ref 70–140)
Potassium: 3.7 mEq/L (ref 3.5–5.1)
Sodium: 137 mEq/L (ref 136–145)
Total Protein: 7 g/dL (ref 6.4–8.3)

## 2014-09-10 ENCOUNTER — Other Ambulatory Visit: Payer: Self-pay | Admitting: *Deleted

## 2014-09-10 DIAGNOSIS — D509 Iron deficiency anemia, unspecified: Secondary | ICD-10-CM

## 2014-09-10 DIAGNOSIS — D693 Immune thrombocytopenic purpura: Secondary | ICD-10-CM

## 2014-09-13 ENCOUNTER — Other Ambulatory Visit (HOSPITAL_BASED_OUTPATIENT_CLINIC_OR_DEPARTMENT_OTHER): Payer: BC Managed Care – PPO

## 2014-09-13 ENCOUNTER — Ambulatory Visit (HOSPITAL_BASED_OUTPATIENT_CLINIC_OR_DEPARTMENT_OTHER): Payer: BC Managed Care – PPO | Admitting: Oncology

## 2014-09-13 VITALS — BP 119/91 | HR 89 | Temp 97.7°F | Resp 20 | Ht 70.0 in | Wt 269.3 lb

## 2014-09-13 DIAGNOSIS — R209 Unspecified disturbances of skin sensation: Secondary | ICD-10-CM

## 2014-09-13 DIAGNOSIS — D693 Immune thrombocytopenic purpura: Secondary | ICD-10-CM

## 2014-09-13 DIAGNOSIS — I309 Acute pericarditis, unspecified: Secondary | ICD-10-CM

## 2014-09-13 DIAGNOSIS — D509 Iron deficiency anemia, unspecified: Secondary | ICD-10-CM

## 2014-09-13 DIAGNOSIS — Z862 Personal history of diseases of the blood and blood-forming organs and certain disorders involving the immune mechanism: Secondary | ICD-10-CM

## 2014-09-13 LAB — CBC WITH DIFFERENTIAL/PLATELET
BASO%: 0.3 % (ref 0.0–2.0)
Basophils Absolute: 0 10*3/uL (ref 0.0–0.1)
EOS%: 3.6 % (ref 0.0–7.0)
Eosinophils Absolute: 0.3 10*3/uL (ref 0.0–0.5)
HEMATOCRIT: 37 % (ref 34.8–46.6)
HEMOGLOBIN: 12.6 g/dL (ref 11.6–15.9)
LYMPH%: 21.8 % (ref 14.0–49.7)
MCH: 28.4 pg (ref 25.1–34.0)
MCHC: 34.1 g/dL (ref 31.5–36.0)
MCV: 83.3 fL (ref 79.5–101.0)
MONO#: 0.6 10*3/uL (ref 0.1–0.9)
MONO%: 8.5 % (ref 0.0–14.0)
NEUT#: 4.7 10*3/uL (ref 1.5–6.5)
NEUT%: 65.8 % (ref 38.4–76.8)
Platelets: 215 10*3/uL (ref 145–400)
RBC: 4.44 10*6/uL (ref 3.70–5.45)
RDW: 15.4 % — ABNORMAL HIGH (ref 11.2–14.5)
WBC: 7.2 10*3/uL (ref 3.9–10.3)
lymph#: 1.6 10*3/uL (ref 0.9–3.3)

## 2014-09-13 LAB — COMPREHENSIVE METABOLIC PANEL (CC13)
ALBUMIN: 4 g/dL (ref 3.5–5.0)
ALT: 13 U/L (ref 0–55)
ANION GAP: 8 meq/L (ref 3–11)
AST: 14 U/L (ref 5–34)
Alkaline Phosphatase: 53 U/L (ref 40–150)
BILIRUBIN TOTAL: 0.95 mg/dL (ref 0.20–1.20)
BUN: 9.7 mg/dL (ref 7.0–26.0)
CO2: 24 mEq/L (ref 22–29)
Calcium: 9.1 mg/dL (ref 8.4–10.4)
Chloride: 107 mEq/L (ref 98–109)
Creatinine: 0.8 mg/dL (ref 0.6–1.1)
GLUCOSE: 110 mg/dL (ref 70–140)
Potassium: 3.7 mEq/L (ref 3.5–5.1)
SODIUM: 140 meq/L (ref 136–145)
TOTAL PROTEIN: 7.1 g/dL (ref 6.4–8.3)

## 2014-09-13 LAB — FERRITIN CHCC: Ferritin: 15 ng/ml (ref 9–269)

## 2014-09-13 NOTE — Addendum Note (Signed)
Addended by: Renford Dills on: 09/13/2014 03:51 PM   Modules accepted: Medications

## 2014-09-13 NOTE — Progress Notes (Signed)
Patient ID: Bailey Sheppard, female   DOB: 12/11/1981, 33 y.o.   MRN: [id number] [place]  [phone] [phone]  OFFICE PROGRESS NOTE   ID: Bailey Sheppard   DOB: 1981/04/05  MR#: [id number]  XFG#:[id number]  PCP:  Joneen Caraway, M.D. GYN: Saralyn Pilar   HISTORY OF PRESENT ILLNESS: Bailey Sheppard is a  [place], [place] woman who moved from [place] with a documented history of ITP.  She was first diagnosed with ITP by a hematologist in [place] in 2007.  At that time she had a history of cytopenia with associated heavy menstrual bleeding.  She presented to our ED in 02/2012 with an unusual headache and bruises on different areas of her body.  She was seen by Dr. 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 The patient stated while living in [place], her blood counts had been up and down but she only received one course of high-dose prednisone in the prior 6 years. When she had been treated with oral steroids, she responded to the treatment.  Dr. Truddie Coco also noted absent iron stores. Her iron deficiency had been treated with IV iron multiple times.  She moved back to [place] in 2010,  but had not sought medical attention before presenting to the ED in 02/2012.   Dr. Truddie Coco treated [name] with a slow Prednisone taper for relapsed ITP.  She had been off steroids since 04/2012 and was being observed. Her platelet counts had normalized. She has a history of having  heavy menses but her menses have been scant if any since having an IUD placed in 04/2012.  Bailey Sheppard called the office of the [facility] on 06/08/2013 reporting new areas of bruising in all extremities, especially in thighs, gum bleeding when brushing her teeth, and blood clots from the left nostril. CBC laboratory results obtained at the [facility] revealed a platelet count of 5000.  Further  evaluation at the ED was recommended.  Ms. Ayotte was admitted to the hospital from 06/08/2013 - 06/12/2013 and was diagnosed with relapsed ITP and acute pericarditis.  While hospitalized, the patient received intravenous Dexamethasone and IVIG.  Her platelet counts had normalized to 259,000 following hospitalization.   INTERVAL HISTORY: Bailey Sheppard Returns today for followup of her immune thrombocytopenia. The interval history is generally unremarkable. She is doing "good" and has had no bruising or bleeding episodes since her last visit here. She's also not having periods, does have an IUD in place  REVIEW OF SYSTEMS: Bailey Sheppard is not exercising regularly. Sometimes she develops numbness in the tips of her second and third fingers in both hands. This comes and goes. It doesn't seem to be associated with any time of day or activity. Rarely she has a severe headache. These tend to onset gradually, beginning from the back and reaching to the front of her head. They may have been 3 or 4 times a month. It is not clear whether they represent migraines, stress headaches, or other form of. A detailed review of systems today was otherwise stable   PAST MEDICAL HISTORY: Past Medical History  Diagnosis Date  . ITP (idiopathic thrombocytopenic purpura)   . Fibroids   . Acute pericarditis, unspecified 06/11/2013    PAST SURGICAL HISTORY: Past Surgical History  Procedure Laterality Date  . Umbilical hernia repair    . Intrauterine device (iud) insertion  04/2012    FAMILY HISTORY Family History  Problem  Relation Age of Onset  . Hypertension Mother   . Diabetes Father   . Lupus Cousin     GYNECOLOGIC HISTORY:   G3 P2 A1, menarche age 36,  parity age 27, she has a Mirena IUD in place.   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 The patient is in a stable relationship (12 years) with Reyne Dumas, who works as a Freight forwarder for [employer]. They have 2  sons, aged 5 and 39.    ADVANCED DIRECTIVES:  Not on file  HEALTH MAINTENANCE: History  Substance Use Topics  . Smoking status: Never Smoker   . Smokeless tobacco: Never Used  . Alcohol Use: Yes     Comment: Rare    Colonoscopy: N/A PAP:  03/2012 Bone density:  N/A Lipid panel:  Not on file  Allergies  Allergen Reactions  . Nsaids     ITP    Current Outpatient Prescriptions  Medication Sig Dispense Refill  . acetaminophen (TYLENOL) 500 MG tablet Take 1,000 mg by mouth every 6 (six) hours as needed for pain.       Marland Kitchen ibuprofen (ADVIL,MOTRIN) 800 MG tablet Take 1 tablet (800 mg total) by mouth every 8 (eight) hours as needed.  21 tablet  0  . levonorgestrel (MIRENA) 20 MCG/24HR IUD 1 each by Intrauterine route once.       No current facility-administered medications for this visit.    OBJECTIVE: Young African American woman in no acute distress Filed Vitals:   09/13/14 1411  BP: 119/91  Pulse: 89  Temp: 97.7 F (36.5 C)  Resp: 20     Body mass index is 38.64 kg/(m^2).     Filed Weights   09/13/14 1411  Weight: 269 lb 4.8 oz (122.154 kg)   young [place] American female who appears comfortable and is in no acute distress  ECOG FS: 0 - Asymptomatic   Sclerae unicteric, pupils equal and reactive Oropharynx clear and moist-- no thrush No cervical or supraclavicular adenopathy Lungs no rales or rhonchi Heart regular rate and rhythm Abd soft, nontender, positive bowel sounds MSK no focal spinal tenderness, no joint edema Neuro: nonfocal, well oriented, appropriate affect Breasts: Deferred Skin: No petechiae or bruises noted     LAB RESULTS: Lab Results  Component Value Date   WBC 7.2 09/13/2014   NEUTROABS 4.7 09/13/2014   HGB 12.6 09/13/2014   HCT 37.0 09/13/2014   MCV 83.3 09/13/2014   PLT 215 09/13/2014      Chemistry      Component Value Date/Time   NA 137 06/14/2014 1421   NA 138 01/21/2014 1949   K 3.7 06/14/2014 1421   K 3.7 01/21/2014 1949   CL 101  01/21/2014 1949   CL 105 06/19/2013 0845   CO2 23 06/14/2014 1421   CO2 24 01/21/2014 1949   BUN 11.6 06/14/2014 1421   BUN 10 01/21/2014 1949   CREATININE 0.8 06/14/2014 1421   CREATININE 0.70 01/21/2014 1949      Component Value Date/Time   CALCIUM 9.0 06/14/2014 1421   CALCIUM 9.0 01/21/2014 1949   ALKPHOS 66 06/14/2014 1421   ALKPHOS 67 01/21/2014 1949   AST 16 06/14/2014 1421   AST 12 01/21/2014 1949   ALT 16 06/14/2014 1421   ALT 12 01/21/2014 1949   BILITOT 0.68 06/14/2014 1421   BILITOT 0.6 01/21/2014 1949        STUDIES: No results found.  ASSESSMENT: 33 y.o. [place], [place]  [place] woman:  1.  ITP, requiring hospitalization in June 2014. Patient was treated with IV dexamethasone and IVIG. She was then on a dexamethasone taper between 06/22/2013 and 07/12/2013. She received 6 weekly doses of rituximab, followed by 2 q. three-week doses, with the final dose given on 08/21/2013.  2. History of iron deficiency anemia, most recent dose of IV Feraheme was given in March 2013.  3. Acute pericarditis - resolved (the patient is still taking Colchicine daily)   PLAN: Odesser is doing fine as far as her immune thrombocytopenia is concerned. It is very possible that she is cured. On the other hand she understands she may have a relapse at any point.  I don't have a simple explanation for the numbness she sometimes feels in the second and third digits of both hands. Possibly this is a form of her early carpal tunnel, or it may be related to her work, which involves a great deal of typing. She can discuss this further as well as issues relating to possible migraines with Dr. Prince Rome.  I would be very comfortable releasing her primary care physician. The patient herself would prefer that. If she develops any petechiae or bruises of course she will call and we will be glad to check her CBC on an urgent basis. I will be glad to see her again at any point as the need may arise, but as of now we are  making no further routine appointment for her here.   Chauncey Cruel, MD  09/13/2014, 2:29 PM

## 2015-02-18 NOTE — Telephone Encounter (Signed)
none

## 2016-12-18 ENCOUNTER — Telehealth: Payer: Self-pay

## 2016-12-18 NOTE — Telephone Encounter (Signed)
SENT NOTES TO SCHEDULING 

## 2016-12-20 DIAGNOSIS — I10 Essential (primary) hypertension: Secondary | ICD-10-CM | POA: Insufficient documentation

## 2016-12-31 DIAGNOSIS — R7309 Other abnormal glucose: Secondary | ICD-10-CM

## 2016-12-31 HISTORY — DX: Other abnormal glucose: R73.09

## 2017-01-07 ENCOUNTER — Ambulatory Visit: Payer: Self-pay | Admitting: Cardiovascular Disease

## 2017-03-11 ENCOUNTER — Ambulatory Visit (INDEPENDENT_AMBULATORY_CARE_PROVIDER_SITE_OTHER): Payer: 59 | Admitting: Psychology

## 2017-03-11 DIAGNOSIS — F331 Major depressive disorder, recurrent, moderate: Secondary | ICD-10-CM | POA: Diagnosis not present

## 2017-03-21 ENCOUNTER — Ambulatory Visit (INDEPENDENT_AMBULATORY_CARE_PROVIDER_SITE_OTHER): Payer: 59 | Admitting: Psychology

## 2017-03-21 DIAGNOSIS — F331 Major depressive disorder, recurrent, moderate: Secondary | ICD-10-CM

## 2017-04-18 ENCOUNTER — Ambulatory Visit (INDEPENDENT_AMBULATORY_CARE_PROVIDER_SITE_OTHER): Payer: 59 | Admitting: Psychology

## 2017-04-18 DIAGNOSIS — F331 Major depressive disorder, recurrent, moderate: Secondary | ICD-10-CM | POA: Diagnosis not present

## 2017-04-25 ENCOUNTER — Ambulatory Visit (INDEPENDENT_AMBULATORY_CARE_PROVIDER_SITE_OTHER): Payer: 59 | Admitting: Psychology

## 2017-04-25 DIAGNOSIS — F331 Major depressive disorder, recurrent, moderate: Secondary | ICD-10-CM | POA: Diagnosis not present

## 2017-05-02 ENCOUNTER — Ambulatory Visit (INDEPENDENT_AMBULATORY_CARE_PROVIDER_SITE_OTHER): Payer: 59 | Admitting: Psychology

## 2017-05-02 DIAGNOSIS — F331 Major depressive disorder, recurrent, moderate: Secondary | ICD-10-CM

## 2017-05-16 ENCOUNTER — Ambulatory Visit (INDEPENDENT_AMBULATORY_CARE_PROVIDER_SITE_OTHER): Payer: 59 | Admitting: Psychology

## 2017-05-16 DIAGNOSIS — F331 Major depressive disorder, recurrent, moderate: Secondary | ICD-10-CM

## 2017-05-23 ENCOUNTER — Ambulatory Visit (INDEPENDENT_AMBULATORY_CARE_PROVIDER_SITE_OTHER): Payer: 59 | Admitting: Psychology

## 2017-05-23 DIAGNOSIS — F331 Major depressive disorder, recurrent, moderate: Secondary | ICD-10-CM

## 2017-05-30 ENCOUNTER — Ambulatory Visit: Payer: 59 | Admitting: Psychology

## 2017-06-06 ENCOUNTER — Ambulatory Visit: Payer: Self-pay | Admitting: Psychology

## 2017-06-13 ENCOUNTER — Ambulatory Visit (INDEPENDENT_AMBULATORY_CARE_PROVIDER_SITE_OTHER): Payer: 59 | Admitting: Psychology

## 2017-06-13 DIAGNOSIS — F331 Major depressive disorder, recurrent, moderate: Secondary | ICD-10-CM | POA: Diagnosis not present

## 2017-06-20 ENCOUNTER — Ambulatory Visit (INDEPENDENT_AMBULATORY_CARE_PROVIDER_SITE_OTHER): Payer: 59 | Admitting: Psychology

## 2017-06-20 DIAGNOSIS — F331 Major depressive disorder, recurrent, moderate: Secondary | ICD-10-CM | POA: Diagnosis not present

## 2017-06-27 ENCOUNTER — Ambulatory Visit (INDEPENDENT_AMBULATORY_CARE_PROVIDER_SITE_OTHER): Payer: 59 | Admitting: Psychology

## 2017-06-27 DIAGNOSIS — F331 Major depressive disorder, recurrent, moderate: Secondary | ICD-10-CM

## 2017-07-11 ENCOUNTER — Ambulatory Visit: Payer: 59 | Admitting: Psychology

## 2017-07-25 ENCOUNTER — Ambulatory Visit: Payer: Self-pay | Admitting: Psychology

## 2017-07-29 ENCOUNTER — Ambulatory Visit (INDEPENDENT_AMBULATORY_CARE_PROVIDER_SITE_OTHER): Payer: Managed Care, Other (non HMO) | Admitting: Family Medicine

## 2017-07-29 ENCOUNTER — Encounter: Payer: Self-pay | Admitting: Family Medicine

## 2017-07-29 VITALS — BP 154/100 | HR 98 | Temp 97.9°F | Ht 69.5 in | Wt 300.4 lb

## 2017-07-29 DIAGNOSIS — I10 Essential (primary) hypertension: Secondary | ICD-10-CM | POA: Diagnosis not present

## 2017-07-29 DIAGNOSIS — H6501 Acute serous otitis media, right ear: Secondary | ICD-10-CM

## 2017-07-29 DIAGNOSIS — Z23 Encounter for immunization: Secondary | ICD-10-CM | POA: Diagnosis not present

## 2017-07-29 DIAGNOSIS — Z30431 Encounter for routine checking of intrauterine contraceptive device: Secondary | ICD-10-CM

## 2017-07-29 DIAGNOSIS — Z6841 Body Mass Index (BMI) 40.0 and over, adult: Secondary | ICD-10-CM

## 2017-07-29 DIAGNOSIS — Z7689 Persons encountering health services in other specified circumstances: Secondary | ICD-10-CM | POA: Diagnosis not present

## 2017-07-29 MED ORDER — HYDROCHLOROTHIAZIDE 25 MG PO TABS: 25.0000 mg | ORAL_TABLET | Freq: Every day | ORAL | 1 refills | Status: DC

## 2017-07-29 MED ORDER — AMOXICILLIN 875 MG PO TABS: 875.0000 mg | ORAL_TABLET | Freq: Two times a day (BID) | ORAL | 0 refills | Status: DC

## 2017-07-29 NOTE — Progress Notes (Signed)
Subjective:    Patient ID: Bailey Sheppard, female    DOB: November 18, 1981, 36 y.o.   MRN: [id number]  HPI This is a 36 yo female who presents today to establish care and discuss right ear pain x 1 week.  She has 2 boys, 12, 7. 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 She enjoys spending time with her family.   Hypertension- was on HCTZ, stopped because blood pressure was good. Tolerated well without side effects.   Ear pain- right ear x 1 week, with sore throat, nasal congestion, dry cough. Ear pain getting worse, kept her up last night. No seasonal allergies. No asthma history. No SOB, no wheeze, subjective fever.   Weight gain- was on chronic steroids for 3 months several years ago for ITP, has gained 100 pounds. Diet recall: Breakfast- doesn't usually eat during the week, has coffee with cream x 3and sugar x3. Lunch- eats out, [employer] or [employer], Holiday representative- eats out, usually fast food. Snacks- rare. No soda, occasional sweet tea, no juice. Drinks water. No exercise.   IUD- placed 2012, her ob/gyn moved. Thinking about another baby. Requests referral to ob/gyn.      Past Medical History:  Diagnosis Date  . Acute pericarditis, unspecified 06/11/2013  . Anxiety   . Clotting disorder ([place])   . Depression   . Fibroids   . Hypertension   . ITP (idiopathic thrombocytopenic purpura)    Past Surgical History:  Procedure Laterality Date  . INTRAUTERINE DEVICE (IUD) INSERTION  04/2012  . UMBILICAL HERNIA REPAIR     Family History  Problem Relation Age of Onset  . Hypertension Mother   . Diabetes Father   . Lupus Cousin    Social History  Substance Use Topics  . Smoking status: Never Smoker  . Smokeless tobacco: Never Used  . Alcohol use Yes     Comment: Rare      Review of Systems  Constitutional: Negative for fever.  HENT: Positive for ear pain, rhinorrhea and sore throat.   Respiratory: Positive for cough. Negative for chest tightness, shortness of breath and  wheezing.   Cardiovascular: Negative for chest pain, palpitations and leg swelling.       Objective:   Physical Exam  Constitutional: She is oriented to person, place, and time. She appears well-developed and well-nourished. No distress.  Obese.   HENT:  Head: Normocephalic and atraumatic.  Right Ear: Ear canal normal. There is tenderness. No drainage. Tympanic membrane is injected.  Left Ear: Tympanic membrane, external ear and ear canal normal.  Nose: Mucosal edema and rhinorrhea present.  Mouth/Throat: Oropharynx is clear and moist.  Eyes: Conjunctivae are normal.  Neck: Normal range of motion. Neck supple.  Cardiovascular: Normal rate, regular rhythm and normal heart sounds.   Pulmonary/Chest: Effort normal and breath sounds normal.  Musculoskeletal: She exhibits no edema.  Neurological: She is alert and oriented to person, place, and time.  Skin: Skin is warm and dry. She is not diaphoretic.  Psychiatric: She has a normal mood and affect. Her behavior is normal. Judgment and thought content normal.  Vitals reviewed.        BP (!) 154/100 (BP Location: Right Arm, Patient Position: Sitting, Cuff Size: Large)   Pulse 98   Temp 97.9 F (36.6 C) (Oral)   Ht 5' 9.5" (1.765 m)   Wt (!) 300 lb 6.4 oz (136.3 kg)   SpO2 97%   BMI 43.73 kg/m  Wt Readings from Last 3 Encounters:  07/29/17 (!) 300 lb 6.4 oz (136.3 kg)  09/13/14 269 lb 4.8 oz (122.2 kg)  01/21/14 260 lb (117.9 kg)   Recheck blood pressure- 148/92  BMP from 03/01/17- all values WNL  Assessment & Plan:  1. Encounter to establish care  2. Right acute serous otitis media, recurrence not specified - Provided written and verbal information regarding diagnosis and treatment. - amoxicillin (AMOXIL) 875 MG tablet; Take 1 tablet (875 mg total) by mouth 2 (two) times daily.  Dispense: 20 tablet; Refill: 0 - RTC precautions reviewed.   3. Essential hypertension - recheck in office improved, she will monitor at  home - hydrochlorothiazide (HYDRODIURIL) 25 MG tablet; Take 1 tablet (25 mg total) by mouth daily.  Dispense: 90 tablet; Refill: 1 - follow up in 3 months, check BMET - discussed role of diet and weight loss, encouraged her to reduce sodium intake and exercise regularly  4. Need for Tdap vaccination - Tdap vaccine greater than or equal to 7yo IM  5. Encounter for routine checking of intrauterine contraceptive device (IUD) - Ambulatory referral to Obstetrics / Gynecology  6. Morbid obesity with BMI of 40.0-44.9, adult - discussed healthy food choices, meal planning, intake tracking, encouraged her to eat out less and increase activity.   Over 45 minutes were spent face-to-face with the patient during this encounter and >50% of that time was spent on counseling and coordination of care   Clarene Reamer, FNP-BC  [facility] [place], [place]  07/31/2017 9:58 AM

## 2017-07-29 NOTE — Patient Instructions (Addendum)
[email or web address]  Follow up in 3-4 months   Mediterranean Diet A Mediterranean diet refers to food and lifestyle choices that are based on the traditions of countries located on the [employer]. This way of eating has been shown to help prevent certain conditions and improve outcomes for people who have chronic diseases, like kidney disease and heart disease. What are tips for following this plan? Lifestyle  Cook and eat meals together with your family, when possible.  Drink enough fluid to keep your urine clear or pale yellow.  Be physically active every day. This includes: ? Aerobic exercise like running or swimming. ? Leisure activities like gardening, walking, or housework.  Get 7-8 hours of sleep each night.  If recommended by your health care provider, drink red wine in moderation. This means 1 glass a day for nonpregnant women and 2 glasses a day for men. A glass of wine equals 5 oz (150 mL). Reading food labels  Check the serving size of packaged foods. For foods such as rice and pasta, the serving size refers to the amount of cooked product, not dry.  Check the total fat in packaged foods. Avoid foods that have saturated fat or trans fats.  Check the ingredients list for added sugars, such as corn syrup. Shopping  At the grocery store, buy most of your food from the areas near the walls of the store. This includes: ? Fresh fruits and vegetables (produce). ? Grains, beans, nuts, and seeds. Some of these may be available in unpackaged forms or large amounts (in bulk). ? Fresh seafood. ? Poultry and eggs. ? Low-fat dairy products.  Buy whole ingredients instead of prepackaged foods.  Buy fresh fruits and vegetables in-season from local farmers markets.  Buy frozen fruits and vegetables in resealable bags.  If you do not have access to quality fresh seafood, buy precooked frozen shrimp or canned fish, such as tuna, salmon, or sardines.  Buy  small amounts of raw or cooked vegetables, salads, or olives from the deli or salad bar at your store.  Stock your pantry so you always have certain foods on hand, such as olive oil, canned tuna, canned tomatoes, rice, pasta, and beans. Cooking  Cook foods with extra-virgin olive oil instead of using butter or other vegetable oils.  Have meat as a side dish, and have vegetables or grains as your main dish. This means having meat in small portions or adding small amounts of meat to foods like pasta or stew.  Use beans or vegetables instead of meat in common dishes like chili or lasagna.  Experiment with different cooking methods. Try roasting or broiling vegetables instead of steaming or sauteing them.  Add frozen vegetables to soups, stews, pasta, or rice.  Add nuts or seeds for added healthy fat at each meal. You can add these to yogurt, salads, or vegetable dishes.  Marinate fish or vegetables using olive oil, lemon juice, garlic, and fresh herbs. Meal planning  Plan to eat 1 vegetarian meal one day each week. Try to work up to 2 vegetarian meals, if possible.  Eat seafood 2 or more times a week.  Have healthy snacks readily available, such as: ? Vegetable sticks with hummus. ? [place] yogurt. ? Fruit and nut trail mix.  Eat balanced meals throughout the week. This includes: ? Fruit: 2-3 servings a day ? Vegetables: 4-5 servings a day ? Low-fat dairy: 2 servings a day ? Fish, poultry, or lean meat: 1 serving a day ? Beans  and legumes: 2 or more servings a week ? Nuts and seeds: 1-2 servings a day ? Whole grains: 6-8 servings a day ? Extra-virgin olive oil: 3-4 servings a day  Limit red meat and sweets to only a few servings a month What are my food choices?  Mediterranean diet ? Recommended ? Grains: Whole-grain pasta. Brown rice. Bulgar wheat. Polenta. Couscous. Whole-wheat bread. [place]. ? Vegetables: Artichokes. Beets. Broccoli. Cabbage. Carrots. Eggplant.  Green beans. Chard. Kale. Spinach. Onions. Leeks. Peas. Squash. Tomatoes. Peppers. Radishes. ? Fruits: Apples. Apricots. Avocado. Berries. Bananas. Cherries. Dates. Figs. Grapes. Lemons. Melon. Oranges. Peaches. Plums. Pomegranate. ? Meats and other protein foods: Beans. Almonds. Sunflower seeds. Pine nuts. Peanuts. [place]. Salmon. Scallops. Shrimp. [place]. Tilapia. Clams. Oysters. Eggs. ? Dairy: Low-fat milk. Cheese. Greek yogurt. ? Beverages: Water. Red wine. Herbal tea. ? Fats and oils: Extra virgin olive oil. Avocado oil. Grape seed oil. ? Sweets and desserts: [place] yogurt with honey. Baked apples. Poached pears. Trail mix. ? Seasoning and other foods: Basil. Cilantro. Coriander. Cumin. Mint. Parsley. Sage. Rosemary. Tarragon. Garlic. Oregano. Thyme. Pepper. Balsalmic vinegar. Tahini. Hummus. Tomato sauce. Olives. Mushrooms. ? Limit these ? Grains: Prepackaged pasta or rice dishes. Prepackaged cereal with added sugar. ? Vegetables: Deep fried potatoes (french fries). ? Fruits: Fruit canned in syrup. ? Meats and other protein foods: Beef. Pork. Lamb. Poultry with skin. Hot dogs. Berniece Salines. ? Dairy: Ice cream. Sour cream. Whole milk. ? Beverages: Juice. Sugar-sweetened soft drinks. Beer. Liquor and spirits. ? Fats and oils: Butter. Canola oil. Vegetable oil. Beef fat (tallow). Lard. ? Sweets and desserts: Cookies. Cakes. Pies. Candy. ? Seasoning and other foods: Mayonnaise. Premade sauces and marinades. ? The items listed may not be a complete list. Talk with your dietitian about what dietary choices are right for you. Summary  The Mediterranean diet includes both food and lifestyle choices.  Eat a variety of fresh fruits and vegetables, beans, nuts, seeds, and whole grains.  Limit the amount of red meat and sweets that you eat.  Talk with your health care provider about whether it is safe for you to drink red wine in moderation. This means 1 glass a day for nonpregnant women and 2 glasses a day  for men. A glass of wine equals 5 oz (150 mL). This information is not intended to replace advice given to you by your health care provider. Make sure you discuss any questions you have with your health care provider. Document Released: 08/09/2016 Document Revised: 09/11/2016 Document Reviewed: 08/09/2016 Elsevier Interactive Patient Education  2018 [employer].   Otitis Media, Adult Otitis media occurs when there is inflammation and fluid in the middle ear. Your middle ear is a part of the ear that contains bones for hearing as well as air that helps send sounds to your brain. What are the causes? This condition is caused by a blockage in the eustachian tube. This tube drains fluid from the ear to the back of the nose (nasopharynx). A blockage in this tube can be caused by an object or by swelling (edema) in the tube. Problems that can cause a blockage include:  A cold or other upper respiratory infection.  Allergies.  An irritant, such as tobacco smoke.  Enlarged adenoids. The adenoids are areas of soft tissue located high in the back of the throat, behind the nose and the roof of the mouth.  A mass in the nasopharynx.  Damage to the ear caused by pressure changes (barotrauma).  What are the  signs or symptoms? Symptoms of this condition include:  Ear pain.  A fever.  Decreased hearing.  A headache.  Tiredness (lethargy).  Fluid leaking from the ear.  Ringing in the ear.  How is this diagnosed? This condition is diagnosed with a physical exam. During the exam your health care provider will use an instrument called an otoscope to look into your ear and check for redness, swelling, and fluid. He or she will also ask about your symptoms. Your health care provider may also order tests, such as:  A test to check the movement of the eardrum (pneumatic otoscopy). This test is done by squeezing a small amount of air into the ear.  A test that changes air pressure in the  middle ear to check how well the eardrum moves and whether the eustachian tube is working (tympanogram).  How is this treated? This condition usually goes away on its own within 3-5 days. But if the condition is caused by a bacteria infection and does not go away own its own, or keeps coming back, your health care provider may:  Prescribe antibiotic medicines to treat the infection.  Prescribe or recommend medicines to control pain.  Follow these instructions at home:  Take over-the-counter and prescription medicines only as told by your health care provider.  If you were prescribed an antibiotic medicine, take it as told by your health care provider. Do not stop taking the antibiotic even if you start to feel better.  Keep all follow-up visits as told by your health care provider. This is important. Contact a health care provider if:  You have bleeding from your nose.  There is a lump on your neck.  You are not getting better in 5 days.  You feel worse instead of better. Get help right away if:  You have severe pain that is not controlled with medicine.  You have swelling, redness, or pain around your ear.  You have stiffness in your neck.  A part of your face is paralyzed.  The bone behind your ear (mastoid) is tender when you touch it.  You develop a severe headache. Summary  Otitis media is redness, soreness, and swelling of the middle ear.  This condition usually goes away on its own within 3-5 days.  If the problem does not go away in 3-5 days, your health care provider may prescribe or recommend medicines to treat your symptoms.  If you were prescribed an antibiotic medicine, take it as told by your health care provider. This information is not intended to replace advice given to you by your health care provider. Make sure you discuss any questions you have with your health care provider. Document Released: 09/21/2004 Document Revised: 12/07/2016 Document  Reviewed: 12/07/2016 Elsevier Interactive Patient Education  2017 [employer].

## 2017-07-30 ENCOUNTER — Telehealth: Payer: Self-pay | Admitting: Obstetrics and Gynecology

## 2017-07-30 NOTE — Telephone Encounter (Signed)
Called and left a message for patient to call back to schedule a new patient doctor referral. Confirm the reason for referral with patient.

## 2017-08-01 ENCOUNTER — Ambulatory Visit (INDEPENDENT_AMBULATORY_CARE_PROVIDER_SITE_OTHER): Payer: 59 | Admitting: Psychology

## 2017-08-01 DIAGNOSIS — F331 Major depressive disorder, recurrent, moderate: Secondary | ICD-10-CM

## 2017-08-02 ENCOUNTER — Ambulatory Visit (INDEPENDENT_AMBULATORY_CARE_PROVIDER_SITE_OTHER): Payer: Managed Care, Other (non HMO) | Admitting: Obstetrics and Gynecology

## 2017-08-02 ENCOUNTER — Other Ambulatory Visit (HOSPITAL_COMMUNITY)
Admission: RE | Admit: 2017-08-02 | Discharge: 2017-08-02 | Disposition: A | Discharge status: Home / Self Care | Payer: Managed Care, Other (non HMO) | Source: Ambulatory Visit | Attending: Obstetrics and Gynecology | Admitting: Obstetrics and Gynecology

## 2017-08-02 ENCOUNTER — Encounter: Payer: Self-pay | Admitting: Obstetrics and Gynecology

## 2017-08-02 VITALS — BP 124/80 | HR 68 | Resp 16 | Ht 70.25 in | Wt 294.0 lb

## 2017-08-02 DIAGNOSIS — Z113 Encounter for screening for infections with a predominantly sexual mode of transmission: Secondary | ICD-10-CM

## 2017-08-02 DIAGNOSIS — Z01419 Encounter for gynecological examination (general) (routine) without abnormal findings: Secondary | ICD-10-CM

## 2017-08-02 DIAGNOSIS — Z3169 Encounter for other general counseling and advice on procreation: Secondary | ICD-10-CM

## 2017-08-02 DIAGNOSIS — Z308 Encounter for other contraceptive management: Secondary | ICD-10-CM

## 2017-08-02 NOTE — Patient Instructions (Signed)

## 2017-08-02 NOTE — Progress Notes (Signed)
36 y.o. [id number] Single African American female here as a new patient for annual exam.  Referred from PCP. Patient wants to discuss IUD removal. Mirena IUD inserted May 2013  Periods were regular and heavy prior to [place].   Considering a third child.   IUD expired.  Not using back up protection.  Not interested in sex.   On Lexapro.   Wants STD testing.   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  PCP:   Clarene Reamer, NP   No LMP recorded. Patient is not currently having periods (Reason: IUD).           Sexually active: Yes.    The current method of family planning is none and Mirena IUD inserted May 2013.    Exercising: No.  The patient does not participate in regular exercise at present. Smoker:  no  Health Maintenance: Pap:  unsure History of abnormal Pap:  Yes, years ago per patient MMG:  n/a Colonoscopy:  n/a BMD:   n/a TDaP:  07/29/17 Gardasil:   yes HIV: done in the past - negative per patient Hep C: done in the past - negative per patient Screening Labs: discuss today   reports that she has never smoked. She has never used smokeless tobacco. She reports that she drinks alcohol. She reports that she does not use drugs.  Past Medical History:  Diagnosis Date  . Acute pericarditis, unspecified 06/11/2013  . Anemia   . Anxiety   . Clotting disorder ([place])   . Depression   . Fibroids   . Hypertension   . ITP (idiopathic thrombocytopenic purpura)     Past Surgical History:  Procedure Laterality Date  . INTRAUTERINE DEVICE (IUD) INSERTION  04/2012  . UMBILICAL HERNIA REPAIR      Current Outpatient Prescriptions  Medication Sig Dispense Refill  . acetaminophen (TYLENOL) 500 MG tablet Take 1,000 mg by mouth every 6 (six) hours as needed for pain.     Marland Kitchen amoxicillin (AMOXIL) 875 MG tablet Take 1 tablet (875 mg total) by mouth 2 (two) times daily. 20 tablet 0  . cyclobenzaprine (FLEXERIL) 10 MG tablet Take by mouth.    . escitalopram  (LEXAPRO) 20 MG tablet Take by mouth.    . hydrochlorothiazide (HYDRODIURIL) 25 MG tablet Take 1 tablet (25 mg total) by mouth daily. 90 tablet 1  . levonorgestrel (MIRENA) 20 MCG/24HR IUD 1 each by Intrauterine route once.    . methocarbamol (ROBAXIN) 500 MG tablet TAKE 2 TABLETS BY MOUTH EVERY 8 HOURS WHEN NOT WORKING DO NOT TAKE AND DRIVE    . promethazine-dextromethorphan (PROMETHAZINE-DM) 6.25-15 MG/5ML syrup TAKE 5 MLS BY MOUTH 4 (FOUR) TIMES A DAY AS NEEDED FOR COUGH.  0   No current facility-administered medications for this visit.     Family History  Problem Relation Age of Onset  . Hypertension Mother   . Diabetes Father   . Kidney failure Father   . Heart failure Father   . Lupus Cousin   . Diabetes Maternal Grandfather   . Kidney failure Maternal Grandfather   . Diabetes Paternal Grandmother     ROS:  Pertinent items are noted in HPI.  Otherwise, a comprehensive ROS was negative.  Exam:   BP 124/80 (BP Location: Right Arm, Patient Position: Sitting, Cuff Size: Large)   Pulse 68   Resp 16   Ht 5' 10.25" (1.784 m)   Wt 294 lb (133.4 kg)   BMI 41.88 kg/m     General  appearance: alert, cooperative and appears stated age Head: Normocephalic, without obvious abnormality, atraumatic Neck: no adenopathy, supple, symmetrical, trachea midline and thyroid normal to inspection and palpation Lungs: clear to auscultation bilaterally Breasts: normal appearance, no masses or tenderness, No nipple retraction or dimpling, No nipple discharge or bleeding, No axillary or supraclavicular adenopathy Heart: regular rate and rhythm Abdomen: soft, non-tender; no masses, no organomegaly Extremities: extremities normal, atraumatic, no cyanosis or edema Skin: Skin color, texture, turgor normal. No rashes or lesions Lymph nodes: Cervical, supraclavicular, and axillary nodes normal. No abnormal inguinal nodes palpated Neurologic: Grossly normal  Pelvic: External genitalia:  no lesions               Urethra:  normal appearing urethra with no masses, tenderness or lesions              Bartholins and Skenes: normal                 Vagina: normal appearing vagina with normal color and discharge, no lesions              Cervix: no lesions.  IUD strings seen.              Pap taken: Yes.   Bimanual Exam:  Uterus:  normal size, contour, position, consistency, mobility, non-tender              Adnexa: no mass, fullness, tenderness                Chaperone was present for exam.  Assessment:   Well woman visit with normal exam. Expired Mirena IUD.  Considering future childbearing.  Hx ITP - had during her pregnancies. Anxiety and depression.  On Lexapro.   Plan: Mammogram screening discussed. Recommended self breast awareness. Pap and HR HPV as above. Guidelines for Calcium, Vitamin D, regular exercise program including cardiovascular and weight bearing exercise. UPT - negative.  STD testing.  Check Rubella immunity. Routine labs with her PCP. Start PNV.  Discussed avoidance of exposures - tobacco, ETOH, etc when trying for pregnancy. Switch off HCTZ and Lexapro due to pregnancy planning.  She will see her PCP for this.  I suggested Zoloft if needs to continue with an SSRI. Return for IUD removal. Use back up protection.  Follow up annually and prn.   After visit summary provided.

## 2017-08-03 LAB — HEP, RPR, HIV PANEL
HIV Screen 4th Generation wRfx: NONREACTIVE
Hepatitis B Surface Ag: NEGATIVE
RPR Ser Ql: NONREACTIVE

## 2017-08-03 LAB — RUBELLA SCREEN: Rubella Antibodies, IGG: 1.74 index (ref >0.99)

## 2017-08-03 LAB — HEPATITIS C ANTIBODY

## 2017-08-05 ENCOUNTER — Telehealth: Payer: Self-pay | Admitting: Obstetrics and Gynecology

## 2017-08-05 NOTE — Telephone Encounter (Signed)
Call placed to patient to review benefits for an IUD removal. Left voicemail message requesting a return call.

## 2017-08-06 ENCOUNTER — Telehealth: Payer: Self-pay | Admitting: Family Medicine

## 2017-08-06 LAB — CYTOLOGY - PAP
CHLAMYDIA, DNA PROBE: NEGATIVE
Diagnosis: NEGATIVE
HPV (WINDOPATH): NOT DETECTED
NEISSERIA GONORRHEA: NEGATIVE
Trichomonas: NEGATIVE

## 2017-08-06 NOTE — Telephone Encounter (Signed)
ROI faxed to [place]

## 2017-08-06 NOTE — Telephone Encounter (Signed)
Second call placed to patient to review benefits for an IUD removal. Left voicemail message requesting a return call.

## 2017-08-06 NOTE — Telephone Encounter (Signed)
Patient returned call. Reviewed benefit for an IUD removal. Patient understood and agreeable. Patient ready to schedule. Patient requested to schedule appointment in September. Patient is scheduled 09/18/17 with Dr Quincy Simmonds. Patient aware of appointment date, arrival time and cancellation policy. No further questions.   Routing to Dr Quincy Simmonds for final review

## 2017-08-07 NOTE — Telephone Encounter (Signed)
Triage, please contact patient for IUD exchange planning.   Patient will need to abstain from intercourse for 2 weeks prior to the IUD exchange and then have a negative urine pregnancy test on the day of the insertion for her to receive the new IUD.   Cc- Lerry Liner

## 2017-08-08 ENCOUNTER — Ambulatory Visit: Payer: 59 | Admitting: Psychology

## 2017-08-08 NOTE — Telephone Encounter (Signed)
Spoke with patient, advised as seen below per Dr. Silva. Patient verbalizes understanding and is agreeable.  Patient is agreeable to disposition. Will close encounter.  

## 2017-08-09 ENCOUNTER — Telehealth: Payer: Self-pay | Admitting: Family Medicine

## 2017-08-09 NOTE — Telephone Encounter (Signed)
Called and left detailed message for pt to return call to office.

## 2017-08-09 NOTE — Telephone Encounter (Signed)
Please advise 

## 2017-08-09 NOTE — Telephone Encounter (Signed)
Duplicate encounter

## 2017-08-09 NOTE — Telephone Encounter (Signed)
Please call patient and advise her to finish antibiotic, she can be seen for a recheck this afternoon or Monday.

## 2017-08-09 NOTE — Telephone Encounter (Signed)
Patient called in to advise that she is still experiencing pain in the ear. It has improved somewhat, but she is down to one pill of amoxicillin. She is asking if another dose should be called in or if appt is needed. Please call patient cell # to advise. Pharmacy is unchanged.

## 2017-08-12 ENCOUNTER — Ambulatory Visit (INDEPENDENT_AMBULATORY_CARE_PROVIDER_SITE_OTHER): Payer: Managed Care, Other (non HMO) | Admitting: Family Medicine

## 2017-08-12 ENCOUNTER — Encounter: Payer: Self-pay | Admitting: Family Medicine

## 2017-08-12 VITALS — BP 138/96 | HR 98 | Temp 98.4°F | Wt 296.0 lb

## 2017-08-12 DIAGNOSIS — F32A Depression, unspecified: Secondary | ICD-10-CM

## 2017-08-12 DIAGNOSIS — H6501 Acute serous otitis media, right ear: Secondary | ICD-10-CM | POA: Diagnosis not present

## 2017-08-12 DIAGNOSIS — I10 Essential (primary) hypertension: Secondary | ICD-10-CM

## 2017-08-12 DIAGNOSIS — F329 Major depressive disorder, single episode, unspecified: Secondary | ICD-10-CM

## 2017-08-12 DIAGNOSIS — F419 Anxiety disorder, unspecified: Secondary | ICD-10-CM

## 2017-08-12 MED ORDER — LABETALOL HCL 100 MG PO TABS: 100.0000 mg | ORAL_TABLET | Freq: Two times a day (BID) | ORAL | 2 refills | Status: DC

## 2017-08-12 MED ORDER — SERTRALINE HCL 50 MG PO TABS: 50.0000 mg | ORAL_TABLET | Freq: Every day | ORAL | 3 refills | Status: DC

## 2017-08-12 MED ORDER — FLUTICASONE PROPIONATE 50 MCG/ACT NA SUSP: 2.0000 | Freq: Every day | NASAL | 6 refills | Status: DC

## 2017-08-12 NOTE — Progress Notes (Signed)
Subjective:    Patient ID: Bailey Sheppard, female    DOB: [birth year], 36 y.o.   MRN: [id number]  HPI This is a 36 yo female who presents today for follow up of right otitis media. She was seen 07/29/17 and prescribed Amoxicillin 875 mg BID x 10 days. Pain improved, off and on now, was constant. Pain better today, feels "pinching and pressure" some relief with counter pressure. Tried some water/H2O2 with temporary relief. Took ibuprofen 200 mg with some relief. Is not supposed to take due to ITP. Some nasal drainage and congestion.   Is having IUD removed next month and will be trying to get pregnant. Saw ob-gyn 08/02/17 who discussed changing current medications- escitalopram and HCTZ to medications safe to take if she becomes pregnant.     Past Medical History:  Diagnosis Date  . Acute pericarditis, unspecified 06/11/2013  . Anemia   . Anxiety   . Clotting disorder ([place])   . Depression   . Fibroids   . Hypertension   . ITP (idiopathic thrombocytopenic purpura)    Past Surgical History:  Procedure Laterality Date  . INTRAUTERINE DEVICE (IUD) INSERTION  04/2012  . UMBILICAL HERNIA REPAIR     Family History  Problem Relation Age of Onset  . Hypertension Mother   . Diabetes Father   . Kidney failure Father   . Heart failure Father   . Lupus Cousin   . Diabetes Maternal Grandfather   . Kidney failure Maternal Grandfather   . Diabetes Paternal Grandmother    Social History  Substance Use Topics  . Smoking status: Never Smoker  . Smokeless tobacco: Never Used  . Alcohol use Yes     Comment: Rare      Review of Systems Per HPI    Objective:   Physical Exam  Constitutional: She is oriented to person, place, and time. She appears well-developed and well-nourished. No distress.  HENT:  Head: Normocephalic and atraumatic.  Right Ear: There is swelling (mild edema of canal). No mastoid tenderness. Tympanic membrane is not injected. A middle ear effusion is present.  Left  Ear: External ear and ear canal normal. No mastoid tenderness. A middle ear effusion is present.  Nose: Mucosal edema and rhinorrhea present. Right sinus exhibits no maxillary sinus tenderness and no frontal sinus tenderness. Left sinus exhibits no maxillary sinus tenderness and no frontal sinus tenderness.  Mouth/Throat: Uvula is midline and mucous membranes are normal. No oropharyngeal exudate, posterior oropharyngeal edema or posterior oropharyngeal erythema.  Sounds mildly congested. Small amount thick post nasal drainage noted. Left ear TM redness resolved from prior visit. Bilateral TMs dull.   Eyes: Conjunctivae are normal.  Cardiovascular: Normal rate.   Pulmonary/Chest: Effort normal.  Neurological: She is alert and oriented to person, place, and time.  Skin: Skin is warm and dry. She is not diaphoretic.  Psychiatric: She has a normal mood and affect. Her behavior is normal. Judgment and thought content normal.  Vitals reviewed.   BP (!) 138/96 (BP Location: Right Arm, Patient Position: Sitting, Cuff Size: Normal)   Pulse 98   Temp 98.4 F (36.9 C) (Oral)   Wt 296 lb (134.3 kg)   SpO2 97%   BMI 42.17 kg/m  Wt Readings from Last 3 Encounters:  08/12/17 296 lb (134.3 kg)  08/02/17 294 lb (133.4 kg)  07/29/17 (!) 300 lb 6.4 oz (136.3 kg)       Assessment & Plan:  1. Right acute serous otitis media, recurrence not specified -  appearance of TM improved, suspect residual fluid causing pain/pressure - Afrin nasal spray BID x 4 days, then use Flonase - encouraged her to take acetaminophen 1000 mg q12 hours instead of 500 mg q4 hours. Discouraged her from taking NSAIDs - fluticasone (FLONASE) 50 MCG/ACT nasal spray; Place 2 sprays into both nostrils daily.  Dispense: 16 g; Refill: 6 - RTC if no improvement in 7-10 days, sooner if worsening pain or if she develops discharge, fever - discouraged her from using cotton swabs in ears.   2. Essential hypertension - prior to having IUD  removed she will stop HCTZ and start labetalol- follow up visit in 2 weeks after starting to monitor blood pressure - labetalol (NORMODYNE) 100 MG tablet; Take 1 tablet (100 mg total) by mouth 2 (two) times daily.  Dispense: 60 tablet; Refill: 2  3. Anxiety and depression - discussed weaning escitalopram to 1/2 tablet daily for 2 weeks, then stop and start sertraline. Discussed possible side effects with weaning. - sertraline (ZOLOFT) 50 MG tablet; Take 1 tablet (50 mg total) by mouth daily.  Dispense: 30 tablet; Refill: [place], FNP-BC  [facility]  08/12/2017 11:57 AM

## 2017-08-12 NOTE — Patient Instructions (Addendum)
Use Afrin nasal spray twice a day for 4 days  After using, use Flonase nasal spray- twice a day for 1 week, then at bedtime for at least 3 week  Try to take Tylenol 2 tablets every 12 hours.   Please decrease escitalopram to 1/2 tablet daily for two weeks, then stop. Can start sertraline once off escitalopram.   Prior to getting IUD removed, stop HCTZ and start labetalol. Come in for a follow up visit in 2 weeks after starting new medication to check blood pressure.

## 2017-08-15 ENCOUNTER — Ambulatory Visit (INDEPENDENT_AMBULATORY_CARE_PROVIDER_SITE_OTHER): Payer: 59 | Admitting: Psychology

## 2017-08-15 DIAGNOSIS — F331 Major depressive disorder, recurrent, moderate: Secondary | ICD-10-CM

## 2017-08-22 ENCOUNTER — Ambulatory Visit: Payer: 59 | Admitting: Psychology

## 2017-08-29 ENCOUNTER — Ambulatory Visit: Payer: Self-pay | Admitting: Psychology

## 2017-09-05 ENCOUNTER — Ambulatory Visit: Payer: Self-pay | Admitting: Psychology

## 2017-09-09 ENCOUNTER — Telehealth: Payer: Self-pay | Admitting: Family Medicine

## 2017-09-09 ENCOUNTER — Other Ambulatory Visit: Payer: Self-pay

## 2017-09-09 ENCOUNTER — Other Ambulatory Visit: Payer: Self-pay | Admitting: Family Medicine

## 2017-09-09 DIAGNOSIS — I10 Essential (primary) hypertension: Secondary | ICD-10-CM

## 2017-09-09 DIAGNOSIS — F419 Anxiety disorder, unspecified: Secondary | ICD-10-CM

## 2017-09-09 DIAGNOSIS — F329 Major depressive disorder, single episode, unspecified: Secondary | ICD-10-CM

## 2017-09-09 MED ORDER — SERTRALINE HCL 50 MG PO TABS: 50.0000 mg | ORAL_TABLET | Freq: Every day | ORAL | 3 refills | Status: DC

## 2017-09-09 MED ORDER — LABETALOL HCL 100 MG PO TABS: 100.0000 mg | ORAL_TABLET | Freq: Two times a day (BID) | ORAL | 2 refills | Status: DC

## 2017-09-09 NOTE — Telephone Encounter (Signed)
Please call her and let her know that I have sent in the medications to her pharmacy. It is important that she come in for a blood pressure check two weeks after she starts labetalol.

## 2017-09-09 NOTE — Telephone Encounter (Signed)
Please advise 

## 2017-09-09 NOTE — Telephone Encounter (Signed)
Patient called in reference to Rx she has wrote at her last appointment for sertraline (ZOLOFT) 50 MG tablet and a new BP medicine (patient did not recall name). Patient stated she has lost the paper Rx and would like a new one or have the Rx sent in to the pharmacy. Patient stated she can come in if needed. Please call patient and advise. OK to leave message.   [id number] [address], [place] [phone] (Phone) [phone] (Fax)

## 2017-09-09 NOTE — Telephone Encounter (Signed)
Pt is aware and scheduled for a nurse visit.

## 2017-09-12 ENCOUNTER — Ambulatory Visit: Payer: Self-pay | Admitting: Psychology

## 2017-09-18 ENCOUNTER — Encounter: Payer: Self-pay | Admitting: Obstetrics and Gynecology

## 2017-09-18 ENCOUNTER — Ambulatory Visit (INDEPENDENT_AMBULATORY_CARE_PROVIDER_SITE_OTHER): Payer: Managed Care, Other (non HMO) | Admitting: Obstetrics and Gynecology

## 2017-09-18 VITALS — BP 124/76 | HR 70 | Ht 70.25 in | Wt 300.8 lb

## 2017-09-18 DIAGNOSIS — Z30432 Encounter for removal of intrauterine contraceptive device: Secondary | ICD-10-CM | POA: Diagnosis not present

## 2017-09-18 DIAGNOSIS — R635 Abnormal weight gain: Secondary | ICD-10-CM

## 2017-09-18 DIAGNOSIS — Z308 Encounter for other contraceptive management: Secondary | ICD-10-CM

## 2017-09-18 DIAGNOSIS — Z3169 Encounter for other general counseling and advice on procreation: Secondary | ICD-10-CM

## 2017-09-18 NOTE — Progress Notes (Signed)
GYNECOLOGY  VISIT   HPI: 36 y.o.   Single  African American  female   [phone] with No LMP recorded. Patient is not currently having periods (Reason: IUD).   here for Mirena IUD removal.    Menses were regular but heavy prior to [place] IUD.   Planning for future pregnancy.  Switched to [employer] and Labetolol.  Wants to loose weight.  Gained 100 pounds after taking steroids.  Tried Weight Watchers in the past.   Craving sweets.   Dad is diabetic.   UPT negative.   GYNECOLOGIC HISTORY: No LMP recorded. Patient is not currently having periods (Reason: IUD). Contraception:  Expired Mirena IUD/None Menopausal hormone therapy:  n/a Last mammogram:  n/a Last pap smear: 08-02-17 Neg:Neg HR HPV        OB History    Gravida Para Term Preterm AB Living   3 2 1  0 1 2   SAB TAB Ectopic Multiple Live Births   0 0 0 0 0         Patient Active Problem List   Diagnosis Date Noted  . Numbness and tingling 10/16/2013  . IUD (intrauterine device) in place 08/01/2013  . Obesity 08/01/2013  . Nonspecific abnormal electrocardiogram (ECG) (EKG) 06/11/2013  . Acute pericarditis, unspecified 06/11/2013  . Hyponatremia 06/10/2013  . Chest pain 06/10/2013  . Hyperglycemia 06/10/2013  . Fibroid 05/13/2012  . 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 Iron deficiency anemia 02/20/2012    Past Medical History:  Diagnosis Date  . Acute pericarditis, unspecified 06/11/2013  . Anemia   . Anxiety   . Clotting disorder ([place])   . Depression   . Fibroids   . Hypertension   . ITP (idiopathic thrombocytopenic purpura)     Past Surgical History:  Procedure Laterality Date  . INTRAUTERINE DEVICE (IUD) INSERTION  04/2012  . UMBILICAL HERNIA REPAIR      Current Outpatient Prescriptions  Medication Sig Dispense Refill  . acetaminophen (TYLENOL) 500 MG tablet Take 1,000 mg by mouth every 6 (six) hours as needed for pain.     . cyclobenzaprine (FLEXERIL) 10 MG tablet Take by mouth.     . fluticasone (FLONASE) 50 MCG/ACT nasal spray Place 2 sprays into both nostrils daily. 16 g 6  . labetalol (NORMODYNE) 100 MG tablet Take 1 tablet (100 mg total) by mouth 2 (two) times daily. Come in for blood pressure check 2 weeks after starting. 60 tablet 2  . levonorgestrel (MIRENA) 20 MCG/24HR IUD 1 each by Intrauterine route once.    . methocarbamol (ROBAXIN) 500 MG tablet TAKE 2 TABLETS BY MOUTH EVERY 8 HOURS WHEN NOT WORKING DO NOT TAKE AND DRIVE    . promethazine-dextromethorphan (PROMETHAZINE-DM) 6.25-15 MG/5ML syrup TAKE 5 MLS BY MOUTH 4 (FOUR) TIMES A DAY AS NEEDED FOR COUGH.  0  . sertraline (ZOLOFT) 50 MG tablet Take 1 tablet (50 mg total) by mouth daily. 30 tablet 3   No current facility-administered medications for this visit.      ALLERGIES: Nsaids  Family History  Problem Relation Age of Onset  . Hypertension Mother   . Diabetes Father   . Kidney failure Father   . Heart failure Father   . Lupus Cousin   . Diabetes Maternal Grandfather   . Kidney failure Maternal Grandfather   . Diabetes Paternal Grandmother     Social History   Social History  . Marital status: Single    Spouse name: N/A  . Number of children: N/A  .  Years of education: N/A   Occupational History  . Not on file.   Social History Main Topics  . Smoking status: Never Smoker  . Smokeless tobacco: Never Used  . Alcohol use Yes     Comment: Rare  . Drug use: No  . Sexual activity: Yes    Birth control/ protection: IUD     Comment: Mirena inserted 04/2012   Other Topics Concern  . Not on file   Social History Narrative  . No narrative on file    ROS:  Pertinent items are noted in HPI.  PHYSICAL EXAMINATION:    BP 124/76 (BP Location: Right Arm, Patient Position: Sitting, Cuff Size: Large)   Pulse 70   Ht 5' 10.25" (1.784 m)   Wt (!) 300 lb 12.8 oz (136.4 kg)   BMI 42.85 kg/m     General appearance: alert, cooperative and appears stated age.  Pelvic: External genitalia:   no lesions              Urethra:  normal appearing urethra with no masses, tenderness or lesions              Bartholins and Skenes: normal                 Vagina: normal appearing vagina with normal color and discharge, no lesions              Cervix: no lesions.  IUD strings noted.  Ring forceps used to remove IUD, intact, shown to patient, and discarded.                 Bimanual Exam:  Uterus:  normal size, contour, position, consistency, mobility, non-tender              Adnexa: no mass, fullness, tenderness                Chaperone was present for exam.  ASSESSMENT   Expired IUD.  Desire for future pregnancy.  HTN.  Craving sweets.  Obesity.   PLAN  Start PNV.  Referral to nutrition management.  Discussed importance of weight loss in preparation for pregnancy.  Check HgbA1C.  Follow up prn.    An After Visit Summary was printed and given to the patient.  __15___ minutes face to face time of which over 50% was spent in counseling.

## 2017-09-18 NOTE — Patient Instructions (Signed)
Please start your prenatal vitamin.

## 2017-09-19 ENCOUNTER — Ambulatory Visit: Payer: Self-pay | Admitting: Psychology

## 2017-09-19 LAB — HEMOGLOBIN A1C
Est. average glucose Bld gHb Est-mCnc: 117 mg/dL
Hgb A1c MFr Bld: 5.7 % — ABNORMAL HIGH (ref 4.8–5.6)

## 2017-09-20 ENCOUNTER — Telehealth: Payer: Self-pay

## 2017-09-20 ENCOUNTER — Encounter: Payer: Self-pay | Admitting: Obstetrics and Gynecology

## 2017-09-20 NOTE — Telephone Encounter (Signed)
-----   Message from Nunzio Cobbs, MD sent at 09/20/2017  9:20 AM EDT ----- Please contact patient with lab result.  Her hemoglobin A1C is 5.7, which indicates she has prediabetes. I do strongly recommend she follow through with the nutrition management consultation we have referred her for.  I support weight loss as well, as this will help too.  We don't want her to develop diabetes prior to or during pregnancy as this can cause potential complications. She will need to see her PCP to follow this.

## 2017-09-20 NOTE — Telephone Encounter (Signed)
Left message to call Kaitlyn at [phone]. 

## 2017-09-20 NOTE — Telephone Encounter (Signed)
Spoke with patient. Advised of results as seen below from [place]. Patient verbalizes understanding. Will follow through with referral to nutrition management and work on weight loss. Labs sent to patients PCP Clarene Reamer, NP who she will follow up with. Encounter closed.

## 2017-09-25 ENCOUNTER — Telehealth: Payer: Self-pay | Admitting: Family Medicine

## 2017-09-25 ENCOUNTER — Encounter: Payer: Self-pay | Admitting: *Deleted

## 2017-09-25 ENCOUNTER — Ambulatory Visit (INDEPENDENT_AMBULATORY_CARE_PROVIDER_SITE_OTHER): Payer: Managed Care, Other (non HMO) | Admitting: *Deleted

## 2017-09-25 DIAGNOSIS — I10 Essential (primary) hypertension: Secondary | ICD-10-CM | POA: Diagnosis not present

## 2017-09-25 NOTE — Telephone Encounter (Signed)
Upon check in for bp check, patient informed that she will be swtiching to Dr. Juleen China for PCP.  She wants to remain a patient of [facility] instead of go to [facility]  Ty, -LL

## 2017-09-25 NOTE — Telephone Encounter (Signed)
Noted  

## 2017-09-25 NOTE — Progress Notes (Signed)
Pt present to office for blood pressure check. Has been on Labetalol 100 mg twice a day x 2 weeks. No side effects from medication and denies headaches or dizziness. Pt has forgotten twice to take medication the second dose in a day. Reinforced importance of taking medication every day twice a day. Pt verbalized understanding.   Discussed with Samantha Bp 110/70, pulse 71. Pt tolerating medication without side effects and denies headaches or dizziness. She said to follow up with Dr. Juleen China in 3 months for blood pressure.  Told pt I discuss blood pressure with Samantha and she said that is good, continue medication and follow up with Dr. Juleen China in 3 months. Pt verbalized understanding.

## 2017-09-26 ENCOUNTER — Ambulatory Visit: Payer: Self-pay | Admitting: Psychology

## 2017-10-03 ENCOUNTER — Ambulatory Visit: Payer: Self-pay | Admitting: Psychology

## 2017-10-07 ENCOUNTER — Encounter: Payer: Self-pay | Admitting: Registered"

## 2017-10-07 ENCOUNTER — Encounter: Payer: Managed Care, Other (non HMO) | Attending: Obstetrics and Gynecology | Admitting: Registered"

## 2017-10-07 DIAGNOSIS — R635 Abnormal weight gain: Secondary | ICD-10-CM | POA: Diagnosis not present

## 2017-10-07 DIAGNOSIS — Z713 Dietary counseling and surveillance: Secondary | ICD-10-CM | POA: Diagnosis not present

## 2017-10-07 NOTE — Patient Instructions (Addendum)
-   Increase physical activity to at least 30-45 min, 5 days/week during basketball practices.   - Increase non-starchy vegetable intake during the day such as broccoli, celery, carrots, cauliflower.  - Other snack options can include nuts, greek yogurt, etc.   - Aim to be consistent with eating meals and snacks throughout the day.

## 2017-10-07 NOTE — Progress Notes (Signed)
  Medical Nutrition Therapy:  Appt start time: 3:15 end time:  4:00.   Assessment:  Primary concerns today: recent A1c is 5.7. Pt states she does not want to get diabetes. Pt states diabetes runs in her family. Pt states she is trying to get pregnant. Pt states she has begun to change her eating habits since learning she was in the the prediabetic range with recent A1c. Pt states she used to eat [employer]-mac and cheese, nuggets, cookies along with [employer]'s 4 for $4 combo with cookies as dessert.   Pt states she has children that play basketball, practicing during the week 2 hours/day. Pt states her fiance' prepares majority of the meals.    Preferred Learning Style:   No preference indicated   Learning Readiness:   Ready  Change in progress   MEDICATIONS: See list   DIETARY INTAKE:  Usual eating pattern includes 3 meals and 1-2 snacks per day.  24-hr recall:  B ( AM): [place] bacon, boiled egg, apple  Snk ( AM): sometimes late July popcorn  L ( PM): soup, salad or Zoe's-salmon and steak kabob, greek salad, hummus Snk ( PM): beef jerky D ( PM): rotisserie chicken, steamed vegetables, brown rice Snk ( PM): sometimes apple, orange, strawberries Beverages: water with lemon, sparkling water, Lactaid 1% or skim  Usual physical activity: none stated, sometimes walking  Estimated energy needs: 1800 calories 200 g carbohydrates 135 g protein 50 g fat  Progress Towards Goal(s):  In progress.   Nutritional Diagnosis:  NI-5.8.4 Inconsistent carbohydrate intake As related to nutrition-related knowledge deficit concerning appropriate timing of carbohydrate intake.  As evidenced by prediabetic A1c value recently of 5.7.    Intervention:  Nutrition education and counseling. Pt was educated on the importance of eating consistent meals and snacks during the day; counseled on how to incorporate healthy snacks into her day. Pt was educated on the importance physical activity and how increase  her daily amount. Goals: - Increase physical activity to at least 30-45 min, 5 days/week during basketball practices.  - Increase non-starchy vegetable intake during the day such as broccoli, celery, carrots, cauliflower. - Other snack options can include nuts, greek yogurt, etc.  - Aim to be consistent with eating meals and snacks throughout the day.   Teaching Method Utilized:  Visual Auditory Hands on  Handouts given during visit include:  none  Barriers to learning/adherence to lifestyle change: none  Demonstrated degree of understanding via:  Teach Back   Monitoring/Evaluation:  Dietary intake, exercise, and body weight prn.

## 2017-10-10 ENCOUNTER — Ambulatory Visit: Payer: Self-pay | Admitting: Psychology

## 2017-10-17 ENCOUNTER — Ambulatory Visit: Payer: Self-pay | Admitting: Psychology

## 2017-10-24 ENCOUNTER — Ambulatory Visit: Payer: Self-pay | Admitting: Psychology

## 2017-10-30 ENCOUNTER — Ambulatory Visit: Payer: Managed Care, Other (non HMO) | Admitting: Family Medicine

## 2017-10-31 ENCOUNTER — Ambulatory Visit: Payer: Self-pay | Admitting: Psychology

## 2017-11-07 ENCOUNTER — Ambulatory Visit: Payer: Self-pay | Admitting: Psychology

## 2017-11-14 ENCOUNTER — Ambulatory Visit: Payer: Self-pay | Admitting: Psychology

## 2017-11-15 ENCOUNTER — Ambulatory Visit: Payer: Managed Care, Other (non HMO) | Admitting: Family Medicine

## 2017-12-03 ENCOUNTER — Ambulatory Visit (INDEPENDENT_AMBULATORY_CARE_PROVIDER_SITE_OTHER): Payer: Managed Care, Other (non HMO) | Admitting: Family Medicine

## 2017-12-03 ENCOUNTER — Encounter: Payer: Self-pay | Admitting: Family Medicine

## 2017-12-03 VITALS — BP 128/88 | HR 90 | Temp 98.0°F | Ht 70.25 in | Wt 298.0 lb

## 2017-12-03 DIAGNOSIS — I10 Essential (primary) hypertension: Secondary | ICD-10-CM

## 2017-12-03 DIAGNOSIS — F419 Anxiety disorder, unspecified: Secondary | ICD-10-CM | POA: Diagnosis not present

## 2017-12-03 DIAGNOSIS — R51 Headache: Secondary | ICD-10-CM | POA: Diagnosis not present

## 2017-12-03 DIAGNOSIS — F32A Depression, unspecified: Secondary | ICD-10-CM

## 2017-12-03 DIAGNOSIS — F329 Major depressive disorder, single episode, unspecified: Secondary | ICD-10-CM | POA: Diagnosis not present

## 2017-12-03 DIAGNOSIS — Z6841 Body Mass Index (BMI) 40.0 and over, adult: Secondary | ICD-10-CM

## 2017-12-03 DIAGNOSIS — R519 Headache, unspecified: Secondary | ICD-10-CM

## 2017-12-03 LAB — COMPREHENSIVE METABOLIC PANEL
ALT: 13 U/L (ref 0–35)
AST: 13 U/L (ref 0–37)
Albumin: 4.1 g/dL (ref 3.5–5.2)
Alkaline Phosphatase: 54 U/L (ref 39–117)
BUN: 8 mg/dL (ref 6–23)
CO2: 27 mEq/L (ref 19–32)
Calcium: 9.1 mg/dL (ref 8.4–10.5)
Chloride: 104 mEq/L (ref 96–112)
Creatinine, Ser: 0.64 mg/dL (ref 0.40–1.20)
GFR: 135.5 mL/min (ref >60.00)
Glucose, Bld: 180 mg/dL — ABNORMAL HIGH (ref 70–99)
Potassium: 3.2 mEq/L — ABNORMAL LOW (ref 3.5–5.1)
Sodium: 137 mEq/L (ref 135–145)
Total Bilirubin: 0.8 mg/dL (ref 0.2–1.2)
Total Protein: 6.2 g/dL (ref 6.0–8.3)

## 2017-12-03 LAB — TSH: TSH: 1.76 u[IU]/mL (ref 0.35–4.50)

## 2017-12-03 NOTE — Progress Notes (Signed)
Bailey Sheppard is a 36 y.o. female is here for follow up.  History of Present Illness:   HPI: This is a new patient presenting for evaluation.  She is a 36 year old female with a history of hypertension, depression, and migraines.  She recently had her Mirena removed.  She and her husband are hoping to have another baby.  They already have 2 sons, age 48 and 37.  They are hoping to have a girl.  Patient says that since she has had the Mirena removed, she has had irregular menses.  She does get headaches intermittently.  They are sometimes one-sided with associated photophobia and phonophobia.  She does sometimes have nausea associated with this as well.  Some of her headaches seem to start at the back of her neck and radiate to her temples.  She has had her eyes checked, she wears contacts.  Patient travels often and admits to a forward head position when using her computer and phone.  She denies any trauma recently or remotely.  She is taking prenatal vitamins regularly.  Health Maintenance Due  Topic Date Due  . INFLUENZA VACCINE  07/31/2017   Depression screen [facility] 2/9 10/07/2017 07/29/2017  Decreased Interest 0 0  Down, Depressed, Hopeless 0 0  PHQ - 2 Score 0 0   PMHx, SurgHx, SocialHx, FamHx, Medications, and Allergies were reviewed in the Visit Navigator and updated as appropriate.   Patient Active Problem List   Diagnosis Date Noted  . Hypertension 12/20/2016  . Numbness and tingling 10/16/2013  . IUD (intrauterine device) in place 08/01/2013  . Obesity 08/01/2013  . Nonspecific abnormal electrocardiogram (ECG) (EKG) 06/11/2013  . Acute pericarditis, unspecified 06/11/2013  . Hyponatremia 06/10/2013  . Chest pain 06/10/2013  . Hyperglycemia 06/10/2013  . Fibroid 05/13/2012  . 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 Iron deficiency anemia 02/20/2012   Social History   Tobacco Use  . Smoking status: Never Smoker  . Smokeless tobacco: Never Used  Substance  Use Topics  . Alcohol use: Yes    Comment: Rare  . Drug use: No   Current Medications and Allergies:   .  acetaminophen (TYLENOL) 500 MG tablet, Take 1,000 mg by mouth every 6 (six) hours as needed for pain. , Disp: , Rfl:  .  fluticasone (FLONASE) 50 MCG/ACT nasal spray, Place 2 sprays into both nostrils daily., Disp: 16 g, Rfl: 6 .  labetalol (NORMODYNE) 100 MG tablet, Take 1 tablet (100 mg total) by mouth 2 (two) times daily. Come in for blood pressure check 2 weeks after starting., Disp: 60 tablet, Rfl: 2 .  sertraline (ZOLOFT) 50 MG tablet, Take 1 tablet (50 mg total) by mouth daily., Disp: 30 tablet, Rfl: 3   Allergies  Allergen Reactions  . Nsaids     ITP   Review of Systems   Pertinent items are noted in the HPI. Otherwise, ROS is negative.  Vitals:   Vitals:   12/03/17 1104  BP: 128/88  Pulse: 90  Temp: 98 F (36.7 C)  TempSrc: Oral  SpO2: 98%  Weight: 298 lb (135.2 kg)  Height: 5' 10.25" (1.784 m)     Body mass index is 42.45 kg/m.   Physical Exam:   Physical Exam  Constitutional: She is oriented to person, place, and time. She appears well-developed and well-nourished. No distress.  HENT:  Head: Normocephalic and atraumatic.  Right Ear: External ear normal.  Left Ear: External ear normal.  Mouth/Throat: Oropharynx is clear and moist.  Eyes: Conjunctivae and EOM are normal. Pupils are equal, round, and reactive to light.  Neck: Normal range of motion. Neck supple. No thyromegaly present.  Cardiovascular: Normal rate, regular rhythm, normal heart sounds and intact distal pulses.  Pulmonary/Chest: Effort normal and breath sounds normal.  Abdominal: Soft. Bowel sounds are normal.  Musculoskeletal: Normal range of motion. She exhibits no edema.  Neurological: She is alert and oriented to person, place, and time. She displays normal reflexes. No cranial nerve deficit or sensory deficit. She exhibits normal muscle tone. Coordination normal.  Skin: Skin is  warm.  Psychiatric: She has a normal mood and affect. Her behavior is normal.  Nursing note and vitals reviewed.   Assessment and Plan:   Diagnoses and all orders for this visit:  Nonintractable headache, unspecified chronicity pattern, unspecified headache type Comments: We reviewed migraine and tension headaches and conjunction with the possibility of pregnancy.  We discussed exercises and stretches as well as proper positioning of her neck.  She is cleared to take Tylenol and Phenergan as needed for uncontrolled migraine as needed.  See AVS for supplements that are okay to take as well.  Red flags are reviewed.  Will follow. Orders: -     TSH -     Comp Met (CMET)  Essential hypertension Comments: Well controlled.  No signs of complications, medication side effects, or red flags.  Continue current regimen.    Anxiety and depression Comments: Well controlled.  No signs of complications, medication side effects, or red flags.  Continue current regimen.    Morbid obesity with BMI of 40.0-44.9, adult ([place]) Comments: The patient is asked to make an attempt to improve diet and exercise patterns to aid in medical management of this problem.    . Reviewed expectations re: course of current medical issues. . Discussed self-management of symptoms. . Outlined signs and symptoms indicating need for more acute intervention. . Patient verbalized understanding and all questions were answered. Marland Kitchen Health Maintenance issues including appropriate healthy diet, exercise, and smoking avoidance were discussed with patient. . See orders for this visit as documented in the electronic medical record. . Patient received an After Visit Summary.  Briscoe Deutscher, DO [place], [place] 12/03/2017  Future Appointments  Date Time Provider [place]  03/03/2018  3:00 PM Briscoe Deutscher, DO [facility]  08/08/2018  3:45 PM Amundson Raliegh Ip, MD [place] None

## 2017-12-31 NOTE — L&D Delivery Note (Signed)
Delivery Note At  a viable female was delivered via  (Presentation:LOA ;  ).  APGAR:9 ,9 ; weight pending  .   Placenta status: intact, .  Cord:3V  with the following complications:None .  Cord pH: N/A  Anesthesia:  Epidural Episiotomy:  None Lacerations:  2nd Suture Repair: 3.0 vicryl Est. Blood Loss (mL):180cc    Mom to postpartum.  Baby to Couplet care / Skin to Skin.  Pleas Koch Prothero 09/17/2018, 10:22 PM

## 2018-01-10 ENCOUNTER — Ambulatory Visit (INDEPENDENT_AMBULATORY_CARE_PROVIDER_SITE_OTHER): Payer: Managed Care, Other (non HMO)

## 2018-01-10 DIAGNOSIS — Z23 Encounter for immunization: Secondary | ICD-10-CM

## 2018-01-21 ENCOUNTER — Ambulatory Visit: Payer: Managed Care, Other (non HMO) | Admitting: Certified Nurse Midwife

## 2018-01-22 ENCOUNTER — Other Ambulatory Visit: Payer: Self-pay

## 2018-01-22 ENCOUNTER — Ambulatory Visit (INDEPENDENT_AMBULATORY_CARE_PROVIDER_SITE_OTHER): Payer: Managed Care, Other (non HMO) | Admitting: Certified Nurse Midwife

## 2018-01-22 ENCOUNTER — Encounter: Payer: Self-pay | Admitting: Certified Nurse Midwife

## 2018-01-22 VITALS — BP 110/70 | HR 68 | Resp 16 | Ht 70.25 in | Wt 303.0 lb

## 2018-01-22 DIAGNOSIS — N912 Amenorrhea, unspecified: Secondary | ICD-10-CM | POA: Diagnosis not present

## 2018-01-22 DIAGNOSIS — Z3201 Encounter for pregnancy test, result positive: Secondary | ICD-10-CM

## 2018-01-22 LAB — POCT URINE PREGNANCY: PREG TEST UR: POSITIVE — AB

## 2018-01-22 NOTE — Patient Instructions (Signed)

## 2018-01-22 NOTE — Progress Notes (Signed)
37 y.o.  African [place] female [phone] presents with amenorrhea with + UPT on 01/21/18. LMP 12/18/17.Marland Kitchen Planned pregnancy. Complaining of breast tenderness, fatigue,no nausea. Denies spotting, bleeding or cramping. Medications she is taking are: Prenatal Vitamins, Labetalol for hypertension control, Zoloft. .Patient has not consumed alcohol since + UPT. Spouse supportive. Has two children age 42, 11. Spouse with patient.   ROS pertinent to HPI  O: HPI pertinent to above. Healthy WDWN female Affect: normal, orientation x 3  Last Aex: 08/02/17 normal Pap smear: 08/02/17 negative             Rubella screen: Immune   A: Amenorrhea with positive UPT  5 wk 0 days per LNMP with [facility] 09/24/18. Planned  pregnancy   P: Reviewed with patient importance of prenatal care during pregnancy. Given OB provider list. Reviewed nutrition importance of pregnancy and selecting from all food groups and making sure to have adequate protein intake daily. Discussed avoiding raw or exotic fish, soft cheeses due to risk of bacteria . Discussed concerns with FAS with alcohol use in pregnancy. Discussed increase of IUGR and SIDS with smoking use or second smoke. Reviewed warning signs of early pregnancy and need to advise if occurs. Discussed comfort measures for early pregnancy changes. Offered viability PUS here prior to initiating prenatal care. Discussed importance of continuing her current medications and hypertension control. Warning signs with BP reviewed and need to advise. Patient agreeable. Patient  plans to have PUS. She will be called with insurance information and scheduled. Discussed AMA and will be offered genetic screening once OB care established. Questions addressed at length.  Rv prn, as above  Time spent with patient in face to face counseling regarding pregnancy and prenatal care was 32 minutes

## 2018-02-03 ENCOUNTER — Telehealth: Payer: Self-pay | Admitting: Certified Nurse Midwife

## 2018-02-03 NOTE — Telephone Encounter (Signed)
Call placed to patient to review benefits and scheduled recommended ultrasound. Left voicemail message requesting a return call °

## 2018-02-03 NOTE — Telephone Encounter (Signed)
Spoke with patient regarding benefit for an ultrasound. Patient understood and agreeable. Patient advises she will call back to schedule    cc: Melvia Heaps, CNM

## 2018-02-03 NOTE — Telephone Encounter (Signed)
Patient is returning a call to Suzy. °

## 2018-02-11 LAB — OB RESULTS CONSOLE ABO/RH: RH TYPE: POSITIVE

## 2018-02-11 LAB — OB RESULTS CONSOLE ANTIBODY SCREEN: Antibody Screen: NEGATIVE

## 2018-02-11 LAB — OB RESULTS CONSOLE RUBELLA ANTIBODY, IGM: RUBELLA: IMMUNE

## 2018-02-11 LAB — OB RESULTS CONSOLE GC/CHLAMYDIA
Chlamydia: NEGATIVE
Gonorrhea: NEGATIVE

## 2018-02-11 LAB — OB RESULTS CONSOLE HEPATITIS B SURFACE ANTIGEN: HEP B S AG: NEGATIVE

## 2018-02-11 LAB — OB RESULTS CONSOLE RPR: RPR: NONREACTIVE

## 2018-02-11 LAB — OB RESULTS CONSOLE HIV ANTIBODY (ROUTINE TESTING): HIV: NONREACTIVE

## 2018-02-11 NOTE — Telephone Encounter (Signed)
Call placed to patient to follow up with scheduling ultrasound. Patient advises she has transferred care to [facility] for obstetrical care. Patient states she had her first obstetrical appointment with their office today. Will close referral and encounter  Routing to Melvia Heaps, CNM for final review

## 2018-02-26 ENCOUNTER — Encounter (HOSPITAL_COMMUNITY): Payer: Self-pay

## 2018-03-03 ENCOUNTER — Ambulatory Visit: Payer: Managed Care, Other (non HMO) | Admitting: Family Medicine

## 2018-03-05 ENCOUNTER — Ambulatory Visit: Payer: Self-pay

## 2018-03-05 ENCOUNTER — Ambulatory Visit (INDEPENDENT_AMBULATORY_CARE_PROVIDER_SITE_OTHER): Payer: Managed Care, Other (non HMO) | Admitting: Physician Assistant

## 2018-03-05 ENCOUNTER — Encounter: Payer: Self-pay | Admitting: Physician Assistant

## 2018-03-05 VITALS — BP 120/76 | HR 103 | Temp 98.6°F | Ht 70.25 in | Wt 300.0 lb

## 2018-03-05 DIAGNOSIS — J069 Acute upper respiratory infection, unspecified: Secondary | ICD-10-CM

## 2018-03-05 NOTE — Telephone Encounter (Signed)
See note

## 2018-03-05 NOTE — Telephone Encounter (Signed)
Pt. C/o sinus congestion with yellowish-green mucus,dry cough, sore throat.States she a cold recently and she "feels worse" with this episode. [redacted] weeks pregnant - called her OB and was told to see PCP.Appointment made for today. Reason for Disposition . [1] Sinus pain (not just congestion) AND [2] fever  Answer Assessment - Initial Assessment Questions 1. LOCATION: "Where does it hurt?"      Sinus congestion 2. ONSET: "When did the sinus pain start?"  (e.g., hours, days)      Started 1 week ago 3. SEVERITY: "How bad is the pain?"   (Scale 1-10; mild, moderate or severe)   - MILD (1-3): doesn't interfere with normal activities    - MODERATE (4-7): interferes with normal activities (e.g., work or school) or awakens from sleep   - SEVERE (8-10): excruciating pain and patient unable to do any normal activities      2-3 4. RECURRENT SYMPTOM: "Have you ever had sinus problems before?" If so, ask: "When was the last time?" and "What happened that time?"     Yes 5. NASAL CONGESTION: "Is the nose blocked?" If so, ask, "Can you open it or must you breathe through the mouth?"     Blocked at times 6. NASAL DISCHARGE: "Do you have discharge from your nose?" If so ask, "What color?"     Yellowish - to green 7. FEVER: "Do you have a fever?" If so, ask: "What is it, how was it measured, and when did it start?"      Feels hot to touch 8. OTHER SYMPTOMS: "Do you have any other symptoms?" (e.g., sore throat, cough, earache, difficulty breathing)     Sore throat, cough 9. PREGNANCY: "Is there any chance you are pregnant?" "When was your last menstrual period?"     11 weeks  Protocols used: SINUS PAIN OR CONGESTION-A-AH

## 2018-03-05 NOTE — Telephone Encounter (Signed)
Noted  

## 2018-03-05 NOTE — Patient Instructions (Signed)
It was great to see you!  You have a viral upper respiratory infection. Antibiotics are not needed for this.  Viral infections usually take 7-10 days to resolve.  The cough can last a few weeks to go away.   Push fluids and get plenty of rest. Please return if you are not improving as expected, or if you have high fevers (>101.5) or difficulty swallowing or worsening productive cough.  Call clinic with questions.  I hope you start feeling better soon!  

## 2018-03-05 NOTE — Progress Notes (Signed)
Bailey Sheppard is a 37 y.o. female here for a new problem.  I acted as a Education administrator for [employer], PA-C Bailey Pickler, LPN  History of Present Illness:   Chief Complaint  Patient presents with  . Sinus Problem    Sinus Problem  This is a new problem. Episode onset: Started a week ago. The problem has been gradually worsening since onset. There has been no fever. Associated symptoms include congestion, coughing, headaches, a hoarse voice, sinus pressure, sneezing and a sore throat. Pertinent negatives include no ear pain, neck pain, shortness of breath or swollen glands. (C/o cough x 1 week non-productive, chest congestion. Nasal congestion yellow/green drainage, decreased appetite, some nausea vomited 4-5 times yesterday.) Treatments tried: Pseudoephedrine Hydrochloride 30 mg. The treatment provided no relief.   She is [redacted] weeks pregnant. She had one episode of dizziness that has resolved. She is having nausea which she did not have with her other two pregnancies. She has not been eating much but is trying to push fluids. 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   Past Medical History:  Diagnosis Date  . Acute pericarditis, unspecified 06/11/2013  . Anemia   . Anxiety   . Clotting disorder ([place])   . Depression   . Elevated hemoglobin A1c 2018   5.7  . Fibroids   . Hypertension   . ITP (idiopathic thrombocytopenic purpura)      Social History   Socioeconomic History  . Marital status: Single    Spouse name: Not on file  . Number of children: Not on file  . Years of education: Not on file  . Highest education level: Not on file  Social Needs  . Financial resource strain: Not on file  . Food insecurity - worry: Not on file  . Food insecurity - inability: Not on file  . Transportation needs - medical: Not on file  . Transportation needs - non-medical: Not on file  Occupational  History  . Not on file  Tobacco Use  . Smoking status: Never Smoker  . Smokeless tobacco: Never Used  Substance and Sexual Activity  . Alcohol use: No    Frequency: Never  . Drug use: No  . Sexual activity: Yes    Partners: Male  Other Topics Concern  . Not on file  Social History Narrative  . Not on file    Past Surgical History:  Procedure Laterality Date  . INTRAUTERINE DEVICE (IUD) INSERTION  04/2012  . UMBILICAL HERNIA REPAIR      Family History  Problem Relation Age of Onset  . Hypertension Mother   . Diabetes Father   . Kidney failure Father   . Heart failure Father   . Lupus Cousin   . Diabetes Maternal Grandfather   . Kidney failure Maternal Grandfather   . Diabetes Paternal Grandmother     Allergies  Allergen Reactions  . Nsaids     ITP    Current Medications:   Current Outpatient Medications:  .  acetaminophen (TYLENOL) 500 MG tablet, Take 1,000 mg by mouth every 6 (six) hours as needed for pain. , Disp: , Rfl:  .  labetalol (NORMODYNE) 100 MG tablet, Take 1 tablet (100 mg total) by mouth 2 (two) times daily. Come in for blood pressure check 2 weeks after starting., Disp: 60 tablet, Rfl: 2 .  Prenatal Vit-Fe Fumarate-FA (PRENATAL VITAMIN PO), Take  by mouth., Disp: , Rfl:  .  sertraline (ZOLOFT) 50 MG tablet, Take 1 tablet (50 mg total) by mouth daily., Disp: 30 tablet, Rfl: 3   Review of Systems:   Review of Systems  HENT: Positive for congestion, hoarse voice, sinus pressure, sneezing and sore throat. Negative for ear pain.   Respiratory: Positive for cough. Negative for shortness of breath.   Musculoskeletal: Negative for neck pain.  Neurological: Positive for headaches.    Vitals:   Vitals:   03/05/18 1509  BP: 120/76  Pulse: (!) 103  Temp: 98.6 F (37 C)  TempSrc: Oral  SpO2: 97%  Weight: 300 lb (136.1 kg)  Height: 5' 10.25" (1.784 m)     Body mass index is 42.74 kg/m.  Physical Exam:   Physical Exam  Constitutional: She  appears well-developed. She is cooperative.  Non-toxic appearance. She does not have a sickly appearance. She does not appear ill. No distress.  HENT:  Head: Normocephalic and atraumatic.  Right Ear: Tympanic membrane, external ear and ear canal normal. Tympanic membrane is not erythematous, not retracted and not bulging.  Left Ear: Tympanic membrane, external ear and ear canal normal. Tympanic membrane is not erythematous, not retracted and not bulging.  Nose: Mucosal edema and rhinorrhea present. Right sinus exhibits no maxillary sinus tenderness and no frontal sinus tenderness. Left sinus exhibits no maxillary sinus tenderness and no frontal sinus tenderness.  Mouth/Throat: Uvula is midline and mucous membranes are normal. Posterior oropharyngeal erythema present. No posterior oropharyngeal edema. Tonsils are 1+ on the right. Tonsils are 1+ on the left. No tonsillar exudate.  Eyes: Conjunctivae and lids are normal.  Neck: Trachea normal.  Cardiovascular: Normal rate, regular rhythm, S1 normal, S2 normal and normal heart sounds.  Pulmonary/Chest: Effort normal and breath sounds normal. She has no decreased breath sounds. She has no wheezes. She has no rhonchi. She has no rales.  Lymphadenopathy:    She has no cervical adenopathy.  Neurological: She is alert.  Skin: Skin is warm, dry and intact.  Psychiatric: She has a normal mood and affect. Her speech is normal and behavior is normal.  Nursing note and vitals reviewed.   Assessment and Plan:    Bailey Sheppard was seen today for sinus problem.  Diagnoses and all orders for this visit:  Viral URI   No red flags on exam.  No indication for antibiotics at this time. If nausea persists, I recommended that she see her ob-gyn for management. Provided a list of safe OTC medications she may take for her symptoms. Reviewed return precautions including worsening fever, SOB, worsening cough or other concerns. Push fluids and rest. I recommend that  patient follow-up if symptoms worsen or persist despite treatment x 7-10 days, sooner if needed.   . Reviewed expectations re: course of current medical issues. . Discussed self-management of symptoms. . Outlined signs and symptoms indicating need for more acute intervention. . Patient verbalized understanding and all questions were answered. . See orders for this visit as documented in the electronic medical record. . Patient received an After-Visit Summary.  CMA or LPN served as scribe during this visit. History, Physical, and Plan performed by medical provider. Documentation and orders reviewed and attested to.  Inda Coke, PA-C

## 2018-03-28 ENCOUNTER — Telehealth: Payer: Self-pay | Admitting: Family Medicine

## 2018-03-28 DIAGNOSIS — F419 Anxiety disorder, unspecified: Principal | ICD-10-CM

## 2018-03-28 DIAGNOSIS — F329 Major depressive disorder, single episode, unspecified: Secondary | ICD-10-CM

## 2018-03-28 DIAGNOSIS — F32A Depression, unspecified: Secondary | ICD-10-CM

## 2018-03-28 MED ORDER — SERTRALINE HCL 50 MG PO TABS: 50.0000 mg | ORAL_TABLET | Freq: Every day | ORAL | 0 refills | Status: DC

## 2018-03-28 NOTE — Telephone Encounter (Signed)
See note

## 2018-03-28 NOTE — Telephone Encounter (Signed)
Copied from [place] [id number]. Topic: General - Other >> Mar 28, 2018 10:23 AM Oneta Rack wrote: Relation to pt: self  Call back [place]: [id number] - [place], [place] [phone] (Phone) [phone] (Fax)  Reason for call:  Patient requesting sertraline (ZOLOFT) 50 MG tablet., stating pharmacy was contacted and she only has 1 pill left. Informed patient in the future please allow 48 to 72 hour turn around time, please advise

## 2018-03-28 NOTE — Telephone Encounter (Signed)
Called patient per EW ok to call in refill but needs f/u. Patient states she has not received any further scripts from the one received in September she does admit to not taking every day. Refill called in and app made.

## 2018-04-02 ENCOUNTER — Ambulatory Visit: Payer: Managed Care, Other (non HMO) | Admitting: Family Medicine

## 2018-04-02 DIAGNOSIS — Z0289 Encounter for other administrative examinations: Secondary | ICD-10-CM

## 2018-04-02 NOTE — Progress Notes (Deleted)
   Bailey Sheppard is a 37 y.o. female is here for follow up.  History of Present Illness:   HPI:  There are no preventive care reminders to display for this patient.   Depression screen [facility] 2/9 10/07/2017 07/29/2017  Decreased Interest 0 0  Down, Depressed, Hopeless 0 0  PHQ - 2 Score 0 0   PMHx, SurgHx, SocialHx, FamHx, Medications, and Allergies were reviewed in the Visit Navigator and updated as appropriate.   Patient Active Problem List   Diagnosis Date Noted  . Morbid obesity with BMI of 40.0-44.9, adult ([place]) 12/03/2017  . Anxiety and depression 12/03/2017  . Hypertension 12/20/2016  . Hyponatremia 06/10/2013  . Hyperglycemia 06/10/2013  . Fibroid 05/13/2012  . Idiopathic thrombocytopenic purpura ([place]) 02/20/2012  . Iron deficiency anemia 02/20/2012   Social History   Tobacco Use  . Smoking status: Never Smoker  . Smokeless tobacco: Never Used  Substance Use Topics  . Alcohol use: No    Frequency: Never  . Drug use: No   Current Medications and Allergies:   Current Outpatient Medications:  .  acetaminophen (TYLENOL) 500 MG tablet, Take 1,000 mg by mouth every 6 (six) hours as needed for pain. , Disp: , Rfl:  .  labetalol (NORMODYNE) 100 MG tablet, Take 1 tablet (100 mg total) by mouth 2 (two) times daily. Come in for blood pressure check 2 weeks after starting., Disp: 60 tablet, Rfl: 2 .  Prenatal Vit-Fe Fumarate-FA (PRENATAL VITAMIN PO), Take by mouth., Disp: , Rfl:  .  sertraline (ZOLOFT) 50 MG tablet, Take 1 tablet (50 mg total) by mouth daily., Disp: 30 tablet, Rfl: 0  Allergies  Allergen Reactions  . Nsaids     ITP   Review of Systems   Pertinent items are noted in the HPI. Otherwise, ROS is negative.  Vitals:  There were no vitals filed for this visit.   There is no height or weight on file to calculate BMI. Physical Exam:   Physical Exam Results for orders placed or performed in visit on 01/22/18  POCT urine pregnancy  Result Value Ref Range     Preg Test, Ur Positive (A) Negative    Assessment and Plan:   There are no diagnoses linked to this encounter.  . Reviewed expectations re: course of current medical issues. . Discussed self-management of symptoms. . Outlined signs and symptoms indicating need for more acute intervention. . Patient verbalized understanding and all questions were answered. Marland Kitchen Health Maintenance issues including appropriate healthy diet, exercise, and smoking avoidance were discussed with patient. . See orders for this visit as documented in the electronic medical record. . Patient received an After Visit Summary.  Briscoe Deutscher, DO [place], [place] 04/02/2018  Future Appointments  Date Time Provider [place]  04/02/2018  7:00 AM Briscoe Deutscher, DO [facility]  08/08/2018  3:00 PM Amundson Raliegh Ip, MD [place] None

## 2018-04-03 NOTE — Progress Notes (Signed)
Bailey Sheppard is a 37 y.o. female is here for follow up.  History of Present Illness:  Lonell Grandchild, CMA acting as scribe for Dr. Briscoe Deutscher.   HPI: Patient in for follow up and refill on medications. She is 4 months pregnant. Doing well. Minimal weight gain. Some nausea, controlled without medications.   She continues to take Zoloft with plan to continue through the first 6 months post-partum before considering weaning.   There are no preventive care reminders to display for this patient.   Depression screen [facility] 2/9 10/07/2017 07/29/2017  Decreased Interest 0 0  Down, Depressed, Hopeless 0 0  PHQ - 2 Score 0 0   PMHx, SurgHx, SocialHx, FamHx, Medications, and Allergies were reviewed in the Visit Navigator and updated as appropriate.   Patient Active Problem List   Diagnosis Date Noted  . Morbid obesity with BMI of 40.0-44.9, adult ([place]) 12/03/2017  . Anxiety and depression 12/03/2017  . Hypertension 12/20/2016  . Hyponatremia 06/10/2013  . Hyperglycemia 06/10/2013  . Fibroid 05/13/2012  . Idiopathic thrombocytopenic purpura ([place]) 02/20/2012  . Iron deficiency anemia 02/20/2012   Social History   Tobacco Use  . Smoking status: Never Smoker  . Smokeless tobacco: Never Used  Substance Use Topics  . Alcohol use: No    Frequency: Never  . Drug use: No   Current Medications and Allergies:   .  acetaminophen (TYLENOL) 500 MG tablet, Take 1,000 mg by mouth every 6 (six) hours as needed for pain. , Disp: , Rfl:  .  labetalol (NORMODYNE) 100 MG tablet, Take 1 tablet (100 mg total) by mouth 2 (two) times daily. Come in for blood pressure check 2 weeks after starting., Disp: 60 tablet, Rfl: 2 .  Prenatal Vit-Fe Fumarate-FA (PRENATAL VITAMIN PO), Take by mouth., Disp: , Rfl:  .  sertraline (ZOLOFT) 50 MG tablet, Take 1 tablet (50 mg total) by mouth daily., Disp: 30 tablet, Rfl: 11   Allergies  Allergen Reactions  . Nsaids     ITP   Review of Systems   Pertinent  items are noted in the HPI. Otherwise, ROS is negative.  Vitals:   Vitals:   04/04/18 1550  BP: 124/78  Pulse: 100  Temp: 98.6 F (37 C)  TempSrc: Oral  SpO2: 99%  Weight: (!) 301 lb 9.6 oz (136.8 kg)  Height: 5' 10.5" (1.791 m)     Body mass index is 42.66 kg/m.   Physical Exam:   Physical Exam  Constitutional: She appears well-nourished.  HENT:  Head: Normocephalic and atraumatic.  Eyes: Pupils are equal, round, and reactive to light. EOM are normal.  Neck: Normal range of motion. Neck supple.  Cardiovascular: Normal rate, regular rhythm, normal heart sounds and intact distal pulses.  Pulmonary/Chest: Effort normal.  Abdominal: Soft.  Skin: Skin is warm.  Psychiatric: She has a normal mood and affect. Her behavior is normal.  Nursing note and vitals reviewed.   Assessment and Plan:   Bailey Sheppard was seen today for follow-up.  Diagnoses and all orders for this visit:  Anxiety and depression Comments: Stable. No concerns. Will continue current medication. Orders: -     sertraline (ZOLOFT) 50 MG tablet; Take 1 tablet (50 mg total) by mouth daily.  Essential hypertension Comments: Taking Labetolol 2-3 times per week when she remembers. Controlling BP.  Pregnancy, unspecified gestational age Comments: Followed by OB.    Marland Kitchen Reviewed expectations re: course of current medical issues. . Discussed self-management of symptoms. . Outlined signs  and symptoms indicating need for more acute intervention. . Patient verbalized understanding and all questions were answered. Marland Kitchen Health Maintenance issues including appropriate healthy diet, exercise, and smoking avoidance were discussed with patient. . See orders for this visit as documented in the electronic medical record. . Patient received an After Visit Summary.  Briscoe Deutscher, DO [place], [place] 04/05/2018  Future Appointments  Date Time Provider [place]  04/30/2018 11:00 AM [facility] [place] 3 [facility]  [facility]  08/08/2018  3:00 PM Amundson Raliegh Ip, MD [place] None

## 2018-04-04 ENCOUNTER — Ambulatory Visit (INDEPENDENT_AMBULATORY_CARE_PROVIDER_SITE_OTHER): Payer: Managed Care, Other (non HMO) | Admitting: Family Medicine

## 2018-04-04 ENCOUNTER — Other Ambulatory Visit (HOSPITAL_COMMUNITY): Payer: Self-pay | Admitting: Obstetrics & Gynecology

## 2018-04-04 ENCOUNTER — Encounter: Payer: Self-pay | Admitting: Family Medicine

## 2018-04-04 VITALS — BP 124/78 | HR 100 | Temp 98.6°F | Ht 70.5 in | Wt 301.6 lb

## 2018-04-04 DIAGNOSIS — F419 Anxiety disorder, unspecified: Secondary | ICD-10-CM | POA: Diagnosis not present

## 2018-04-04 DIAGNOSIS — Z3A19 19 weeks gestation of pregnancy: Secondary | ICD-10-CM

## 2018-04-04 DIAGNOSIS — F329 Major depressive disorder, single episode, unspecified: Secondary | ICD-10-CM

## 2018-04-04 DIAGNOSIS — I10 Essential (primary) hypertension: Secondary | ICD-10-CM | POA: Diagnosis not present

## 2018-04-04 DIAGNOSIS — Z3689 Encounter for other specified antenatal screening: Secondary | ICD-10-CM

## 2018-04-04 DIAGNOSIS — O09522 Supervision of elderly multigravida, second trimester: Secondary | ICD-10-CM

## 2018-04-04 DIAGNOSIS — Z3490 Encounter for supervision of normal pregnancy, unspecified, unspecified trimester: Secondary | ICD-10-CM

## 2018-04-04 MED ORDER — SERTRALINE HCL 50 MG PO TABS: 50.0000 mg | ORAL_TABLET | Freq: Every day | ORAL | 11 refills | Status: DC

## 2018-04-05 ENCOUNTER — Encounter: Payer: Self-pay | Admitting: Family Medicine

## 2018-04-21 ENCOUNTER — Other Ambulatory Visit: Payer: Self-pay

## 2018-04-21 DIAGNOSIS — F419 Anxiety disorder, unspecified: Principal | ICD-10-CM

## 2018-04-21 DIAGNOSIS — F329 Major depressive disorder, single episode, unspecified: Secondary | ICD-10-CM

## 2018-04-21 MED ORDER — SERTRALINE HCL 50 MG PO TABS: 50.0000 mg | ORAL_TABLET | Freq: Every day | ORAL | 3 refills | Status: DC

## 2018-04-28 ENCOUNTER — Encounter (HOSPITAL_COMMUNITY): Payer: Self-pay | Admitting: *Deleted

## 2018-04-30 ENCOUNTER — Encounter (HOSPITAL_COMMUNITY): Payer: Self-pay

## 2018-04-30 ENCOUNTER — Other Ambulatory Visit: Payer: Self-pay

## 2018-04-30 ENCOUNTER — Other Ambulatory Visit (HOSPITAL_COMMUNITY): Payer: Self-pay | Admitting: Obstetrics & Gynecology

## 2018-04-30 ENCOUNTER — Other Ambulatory Visit (HOSPITAL_COMMUNITY): Payer: Self-pay | Admitting: *Deleted

## 2018-04-30 ENCOUNTER — Ambulatory Visit (HOSPITAL_COMMUNITY)
Admission: RE | Admit: 2018-04-30 | Discharge: 2018-04-30 | Disposition: A | Discharge status: Home / Self Care | Payer: Managed Care, Other (non HMO) | Source: Ambulatory Visit | Attending: Obstetrics & Gynecology | Admitting: Obstetrics & Gynecology

## 2018-04-30 DIAGNOSIS — Z3689 Encounter for other specified antenatal screening: Secondary | ICD-10-CM

## 2018-04-30 DIAGNOSIS — D259 Leiomyoma of uterus, unspecified: Secondary | ICD-10-CM

## 2018-04-30 DIAGNOSIS — O10012 Pre-existing essential hypertension complicating pregnancy, second trimester: Secondary | ICD-10-CM

## 2018-04-30 DIAGNOSIS — O3412 Maternal care for benign tumor of corpus uteri, second trimester: Secondary | ICD-10-CM | POA: Insufficient documentation

## 2018-04-30 DIAGNOSIS — D696 Thrombocytopenia, unspecified: Secondary | ICD-10-CM | POA: Diagnosis not present

## 2018-04-30 DIAGNOSIS — Z3A19 19 weeks gestation of pregnancy: Secondary | ICD-10-CM

## 2018-04-30 DIAGNOSIS — O99212 Obesity complicating pregnancy, second trimester: Secondary | ICD-10-CM

## 2018-04-30 DIAGNOSIS — O281 Abnormal biochemical finding on antenatal screening of mother: Secondary | ICD-10-CM

## 2018-04-30 DIAGNOSIS — Z363 Encounter for antenatal screening for malformations: Secondary | ICD-10-CM | POA: Insufficient documentation

## 2018-04-30 DIAGNOSIS — O09522 Supervision of elderly multigravida, second trimester: Secondary | ICD-10-CM

## 2018-04-30 DIAGNOSIS — O99112 Other diseases of the blood and blood-forming organs and certain disorders involving the immune mechanism complicating pregnancy, second trimester: Secondary | ICD-10-CM | POA: Insufficient documentation

## 2018-04-30 DIAGNOSIS — Z362 Encounter for other antenatal screening follow-up: Secondary | ICD-10-CM

## 2018-04-30 HISTORY — DX: Benign neoplasm of connective and other soft tissue, unspecified: D21.9

## 2018-05-27 ENCOUNTER — Ambulatory Visit (HOSPITAL_COMMUNITY)
Admission: RE | Admit: 2018-05-27 | Discharge: 2018-05-27 | Disposition: A | Discharge status: Home / Self Care | Payer: Managed Care, Other (non HMO) | Source: Ambulatory Visit | Attending: Obstetrics & Gynecology | Admitting: Obstetrics & Gynecology

## 2018-05-27 ENCOUNTER — Other Ambulatory Visit (HOSPITAL_COMMUNITY): Payer: Self-pay | Admitting: Obstetrics and Gynecology

## 2018-05-27 ENCOUNTER — Encounter (HOSPITAL_COMMUNITY): Payer: Self-pay

## 2018-05-27 DIAGNOSIS — O99112 Other diseases of the blood and blood-forming organs and certain disorders involving the immune mechanism complicating pregnancy, second trimester: Secondary | ICD-10-CM | POA: Diagnosis not present

## 2018-05-27 DIAGNOSIS — Z3A22 22 weeks gestation of pregnancy: Secondary | ICD-10-CM | POA: Diagnosis not present

## 2018-05-27 DIAGNOSIS — O162 Unspecified maternal hypertension, second trimester: Secondary | ICD-10-CM

## 2018-05-27 DIAGNOSIS — O3412 Maternal care for benign tumor of corpus uteri, second trimester: Secondary | ICD-10-CM | POA: Insufficient documentation

## 2018-05-27 DIAGNOSIS — IMO0002 Reserved for concepts with insufficient information to code with codable children: Secondary | ICD-10-CM

## 2018-05-27 DIAGNOSIS — D219 Benign neoplasm of connective and other soft tissue, unspecified: Secondary | ICD-10-CM

## 2018-05-27 DIAGNOSIS — O09522 Supervision of elderly multigravida, second trimester: Secondary | ICD-10-CM | POA: Diagnosis not present

## 2018-05-27 DIAGNOSIS — Z362 Encounter for other antenatal screening follow-up: Secondary | ICD-10-CM | POA: Insufficient documentation

## 2018-05-27 DIAGNOSIS — D259 Leiomyoma of uterus, unspecified: Secondary | ICD-10-CM | POA: Insufficient documentation

## 2018-05-27 DIAGNOSIS — D696 Thrombocytopenia, unspecified: Secondary | ICD-10-CM | POA: Insufficient documentation

## 2018-05-27 DIAGNOSIS — O10012 Pre-existing essential hypertension complicating pregnancy, second trimester: Secondary | ICD-10-CM | POA: Diagnosis not present

## 2018-05-27 DIAGNOSIS — Z0489 Encounter for examination and observation for other specified reasons: Secondary | ICD-10-CM

## 2018-05-28 ENCOUNTER — Other Ambulatory Visit (HOSPITAL_COMMUNITY): Payer: Self-pay | Admitting: *Deleted

## 2018-05-28 DIAGNOSIS — O10919 Unspecified pre-existing hypertension complicating pregnancy, unspecified trimester: Secondary | ICD-10-CM

## 2018-06-24 ENCOUNTER — Ambulatory Visit (HOSPITAL_COMMUNITY)
Admission: RE | Admit: 2018-06-24 | Discharge: 2018-06-24 | Disposition: A | Discharge status: Home / Self Care | Payer: Managed Care, Other (non HMO) | Source: Ambulatory Visit | Attending: Obstetrics & Gynecology | Admitting: Obstetrics & Gynecology

## 2018-06-24 ENCOUNTER — Other Ambulatory Visit (HOSPITAL_COMMUNITY): Payer: Self-pay | Admitting: Maternal and Fetal Medicine

## 2018-06-24 ENCOUNTER — Encounter (HOSPITAL_COMMUNITY): Payer: Self-pay

## 2018-06-24 ENCOUNTER — Other Ambulatory Visit (HOSPITAL_COMMUNITY): Payer: Self-pay | Admitting: *Deleted

## 2018-06-24 DIAGNOSIS — Z3A26 26 weeks gestation of pregnancy: Secondary | ICD-10-CM

## 2018-06-24 DIAGNOSIS — Z362 Encounter for other antenatal screening follow-up: Secondary | ICD-10-CM

## 2018-06-24 DIAGNOSIS — O99112 Other diseases of the blood and blood-forming organs and certain disorders involving the immune mechanism complicating pregnancy, second trimester: Secondary | ICD-10-CM | POA: Insufficient documentation

## 2018-06-24 DIAGNOSIS — O99119 Other diseases of the blood and blood-forming organs and certain disorders involving the immune mechanism complicating pregnancy, unspecified trimester: Secondary | ICD-10-CM

## 2018-06-24 DIAGNOSIS — O09522 Supervision of elderly multigravida, second trimester: Secondary | ICD-10-CM

## 2018-06-24 DIAGNOSIS — D696 Thrombocytopenia, unspecified: Secondary | ICD-10-CM | POA: Diagnosis not present

## 2018-06-24 DIAGNOSIS — O10919 Unspecified pre-existing hypertension complicating pregnancy, unspecified trimester: Secondary | ICD-10-CM

## 2018-06-24 DIAGNOSIS — O3412 Maternal care for benign tumor of corpus uteri, second trimester: Secondary | ICD-10-CM | POA: Insufficient documentation

## 2018-06-24 DIAGNOSIS — D259 Leiomyoma of uterus, unspecified: Secondary | ICD-10-CM

## 2018-06-24 DIAGNOSIS — O10912 Unspecified pre-existing hypertension complicating pregnancy, second trimester: Secondary | ICD-10-CM | POA: Diagnosis not present

## 2018-07-11 ENCOUNTER — Telehealth: Payer: Self-pay | Admitting: *Deleted

## 2018-07-11 NOTE — Telephone Encounter (Signed)
Message left by patient stating she is now 7 months pregnant - and is being followed at [facility] and has been informed that her platelet count is 43,000 per lab draw 2 weeks ago.  She was told to call this office to schedule ( office sent a referral ) - " and I have left several messages on the VM for scheduling but haven't had anyone call me back yet "  This RN spoke with pt - her due date is 09/24/2018 and she is followed by Dr Juleen China.  This note will be reviewed with MD.  Return call number for pt is [phone].

## 2018-07-14 ENCOUNTER — Other Ambulatory Visit: Payer: Self-pay | Admitting: Oncology

## 2018-07-14 ENCOUNTER — Other Ambulatory Visit: Payer: Self-pay | Admitting: *Deleted

## 2018-07-14 ENCOUNTER — Inpatient Hospital Stay: Payer: Managed Care, Other (non HMO) | Attending: Oncology

## 2018-07-14 DIAGNOSIS — Z79899 Other long term (current) drug therapy: Secondary | ICD-10-CM | POA: Insufficient documentation

## 2018-07-14 DIAGNOSIS — F418 Other specified anxiety disorders: Secondary | ICD-10-CM | POA: Diagnosis not present

## 2018-07-14 DIAGNOSIS — D696 Thrombocytopenia, unspecified: Secondary | ICD-10-CM

## 2018-07-14 DIAGNOSIS — D500 Iron deficiency anemia secondary to blood loss (chronic): Secondary | ICD-10-CM | POA: Insufficient documentation

## 2018-07-14 DIAGNOSIS — I10 Essential (primary) hypertension: Secondary | ICD-10-CM | POA: Diagnosis not present

## 2018-07-14 DIAGNOSIS — Z331 Pregnant state, incidental: Secondary | ICD-10-CM | POA: Insufficient documentation

## 2018-07-14 DIAGNOSIS — D693 Immune thrombocytopenic purpura: Secondary | ICD-10-CM | POA: Insufficient documentation

## 2018-07-14 LAB — RETICULOCYTES
RBC.: 3.77 MIL/uL (ref 3.70–5.45)
RETIC COUNT ABSOLUTE: 71.6 10*3/uL (ref 33.7–90.7)
Retic Ct Pct: 1.9 % (ref 0.7–2.1)

## 2018-07-14 LAB — CMP (CANCER CENTER ONLY)
ALBUMIN: 3 g/dL — AB (ref 3.5–5.0)
ALK PHOS: 85 U/L (ref 38–126)
ALT: 21 U/L (ref 0–44)
ANION GAP: 9 (ref 5–15)
AST: 12 U/L — ABNORMAL LOW (ref 15–41)
BUN: 8 mg/dL (ref 6–20)
CHLORIDE: 106 mmol/L (ref 98–111)
CO2: 22 mmol/L (ref 22–32)
Calcium: 8.7 mg/dL — ABNORMAL LOW (ref 8.9–10.3)
Creatinine: 0.67 mg/dL (ref 0.44–1.00)
GFR, Est AFR Am: >60 mL/min (ref >60)
GFR, Estimated: >60 mL/min (ref >60)
GLUCOSE: 123 mg/dL — AB (ref 70–99)
Potassium: 3.5 mmol/L (ref 3.5–5.1)
SODIUM: 137 mmol/L (ref 135–145)
Total Bilirubin: 0.5 mg/dL (ref 0.3–1.2)
Total Protein: 5.9 g/dL — ABNORMAL LOW (ref 6.5–8.1)

## 2018-07-14 LAB — CBC WITH DIFFERENTIAL (CANCER CENTER ONLY)
BASOS ABS: 0 10*3/uL (ref 0.0–0.1)
BASOS PCT: 0 %
Eosinophils Absolute: 0.2 10*3/uL (ref 0.0–0.5)
Eosinophils Relative: 2 %
HEMATOCRIT: 30.3 % — AB (ref 34.8–46.6)
Hemoglobin: 10.1 g/dL — ABNORMAL LOW (ref 11.6–15.9)
LYMPHS PCT: 14 %
Lymphs Abs: 1 10*3/uL (ref 0.9–3.3)
MCH: 26.8 pg (ref 25.1–34.0)
MCHC: 33.3 g/dL (ref 31.5–36.0)
MCV: 80.4 fL (ref 79.5–101.0)
Monocytes Absolute: 0.6 10*3/uL (ref 0.1–0.9)
Monocytes Relative: 8 %
NEUTROS PCT: 76 %
Neutro Abs: 5.6 10*3/uL (ref 1.5–6.5)
Platelet Count: 64 10*3/uL — ABNORMAL LOW (ref 145–400)
RBC: 3.77 MIL/uL (ref 3.70–5.45)
RDW: 15 % — ABNORMAL HIGH (ref 11.2–14.5)
WBC: 7.4 10*3/uL (ref 3.9–10.3)

## 2018-07-14 LAB — TECHNOLOGIST SMEAR REVIEW

## 2018-07-14 LAB — SAVE SMEAR

## 2018-07-14 LAB — LACTATE DEHYDROGENASE: LDH: 159 U/L (ref 98–192)

## 2018-07-14 NOTE — Telephone Encounter (Signed)
This RN contacted pt's ob/gyn office - records to be faxed.  Per MD review- lab appointment scheduled for today with follow up visit tomorrow at pm.  This RN spoke with Julina and appointments made per above.

## 2018-07-14 NOTE — Progress Notes (Signed)
Patient ID: Bailey Sheppard, female   DOB: 11-16-1981, 37 y.o.   MRN: [id number] [facility]  [phone]) [phone] [phone]  OFFICE PROGRESS NOTE   ID: Bailey Sheppard   DOB: 01/18/1981  MR#: [id number]  IRW#:[id number]  PCP:  Joneen Caraway, M.D. GYN: Briscoe Deutscher, DO   HISTORY OF PRESENT ILLNESS: From the earlier summary note:  Bailey Sheppard is a  [place], [place] woman who moved from [place] with a documented history of ITP.  She was first diagnosed with ITP by a hematologist in [place] in 2007.  At that time she had a history of cytopenia with associated heavy menstrual bleeding.    She presented to our ED in 02/2012 with an unusual headache and bruises on different areas of her body.  She was seen by Dr. 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 The patient stated while living in [place], her blood counts had been up and down but she only received one course of high-dose prednisone in the prior 6 years. When she had been treated with oral steroids, she responded to the treatment.  Dr. Truddie Coco also noted absent iron stores. Her iron deficiency had been treated with IV iron multiple times.  She moved back to [place] in 2010,  but had not sought medical attention before presenting to the ED in 02/2012.   Dr. Truddie Coco treated Bailey Sheppard with Prednisone  for relapsed ITP.  She had been off steroids since 04/2012 and was being observed. Her platelet counts had normalized. She has a history of having  heavy menses but her menses have been scant if any since having an IUD placed in 04/2012.  Bailey Sheppard called the office of the [facility] on 06/08/2013 reporting new areas of bruising in all extremities, especially in thighs, gum bleeding when brushing her teeth, and blood clots from the left nostril. CBC laboratory results obtained at the [facility] revealed a platelet count  of 5000.  Further evaluation at the ED was recommended.  Bailey Sheppard was admitted to the hospital from 06/08/2013 - 06/12/2013 and was diagnosed with relapsed ITP and acute pericarditis.  While hospitalized, the patient received intravenous Dexamethasone and IVIG.  Her platelet counts had normalized to 259,000 following hospitalization.  I last saw the patient on 09/13/2014, with platelet count at that time in the normal range (215 K).   INTERVAL HISTORY: Bailey Sheppard returns today for follow-up of her immune thrombocytopenia accompanied by her mother.  The patient has done fine as far as her ITP is concerned, with no bleeding or significant bruising problems.  She does have heavy menses, with a last menstrual period in late December.  She had a positive urine pregnancy test in January and has been followed since with repeat ultrasounds and lab work.  She tells me that her blood pressure has remained well controlled and that they had not found any protein in her urine.   Recently her platelet count dropped to a little under 50,000 and she was referred back for further evaluation and treatment.   REVIEW OF SYSTEMS: Bailey Sheppard reports that her due date is 09/25. She plans to deliver vaginally. She is interested So far, she has not had any significant problems during this pregnancy. She has occasional morning sickness.  She is taking her prenatal vitamins very irregularly.  She has not been exercising, but she is on her feet while working. She denies unusual headaches, visual changes,  vomiting, seizures, altered consciousness or dizziness. There has been no unusual cough, phlegm production, or pleurisy. This been no change in bowel or bladder habits. She denies unexplained fatigue or unexplained weight loss, rash, or fever.  Weight gain has been moderate.  A detailed review of systems was otherwise stable.    PAST MEDICAL HISTORY: Past Medical History:  Diagnosis Date  . Acute pericarditis, unspecified  06/11/2013  . Anemia   . Anxiety   . Clotting disorder ([place])   . Depression   . Elevated hemoglobin A1c 2018   5.7  . Fibroid   . Fibroids   . Hypertension   . ITP (idiopathic thrombocytopenic purpura)     PAST SURGICAL HISTORY: Past Surgical History:  Procedure Laterality Date  . INTRAUTERINE DEVICE (IUD) INSERTION  04/2012  . UMBILICAL HERNIA REPAIR      FAMILY HISTORY Family History  Problem Relation Age of Onset  . Hypertension Mother   . Diabetes Father   . Kidney failure Father   . Heart failure Father   . Lupus Cousin   . Diabetes Maternal Grandfather   . Kidney failure Maternal Grandfather   . Diabetes Paternal Grandmother     GYNECOLOGIC HISTORY:   Last menstrual period 12/18/2017.  Positive pregnancy test 01/21/2018  G3 P2, Menarche age 54. Age of first live birth: 55. She is currently 7 months pregnant with her 3rd child, expecting a boy. She plans to deliver vaginally with due date 09 25.  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 At home is her fiance Ronnie Doss, who is a Software engineer at a private school. They have 2 son together, aged 45 and 10. They are expecting another boy due 09/24/2018.      ADVANCED DIRECTIVES:  Not on file  HEALTH MAINTENANCE: Social History   Tobacco Use  . Smoking status: Never Smoker  . Smokeless tobacco: Never Used  Substance Use Topics  . Alcohol use: No    Frequency: Never  . Drug use: No     Allergies  Allergen Reactions  . Nsaids     ITP    Current Outpatient Medications  Medication Sig Dispense Refill  . acetaminophen (TYLENOL) 500 MG tablet Take 1,000 mg by mouth every 6 (six) hours as needed for pain.     Marland Kitchen labetalol (NORMODYNE) 100 MG tablet Take 1 tablet (100 mg total) by mouth 2 (two) times daily. Come in for blood pressure check 2 weeks after starting. 60  tablet 2  . Prenatal Vit-Fe Fumarate-FA (PRENATAL VITAMIN PO) Take by mouth.    . sertraline (ZOLOFT) 50 MG tablet Take 1 tablet (50 mg total) by mouth daily. 90 tablet 3   No current facility-administered medications for this visit.     OBJECTIVE: Young African American woman who appears stated age 78:   07/15/18 1540  BP: 111/78  Pulse: 95  Resp: 18  Temp: 98.8 F (37.1 C)  SpO2: 100%     Body mass index is 42.47 kg/m.     Filed Weights   07/15/18 1540  Weight: (!) 300 lb 3.2 oz (136.2 kg)   ECOG FS: 1 - Symptomatic but completely ambulatory   Sclerae unicteric, EOMs intact No cervical or supraclavicular adenopathy Lungs no rales or rhonchi Heart regular rate and rhythm Abd and seventh month of pregnancy: Soft, obese, nontender,  positive bowel sounds MSK no focal spinal tenderness, Neuro: nonfocal, well oriented, positive affect Breasts: Deferred  LAB RESULTS: Lab Results  Component Value Date   WBC 7.4 07/14/2018   NEUTROABS 5.6 07/14/2018   HGB 10.1 (L) 07/14/2018   HCT 30.3 (L) 07/14/2018   MCV 80.4 07/14/2018   PLT 64 (L) 07/14/2018      Chemistry      Component Value Date/Time   NA 137 07/14/2018 1507   NA 140 09/13/2014 1400   K 3.5 07/14/2018 1507   K 3.7 09/13/2014 1400   CL 106 07/14/2018 1507   CL 105 06/19/2013 0845   CO2 22 07/14/2018 1507   CO2 24 09/13/2014 1400   BUN 8 07/14/2018 1507   BUN 9.7 09/13/2014 1400   CREATININE 0.67 07/14/2018 1507   CREATININE 0.8 09/13/2014 1400      Component Value Date/Time   CALCIUM 8.7 (L) 07/14/2018 1507   CALCIUM 9.1 09/13/2014 1400   ALKPHOS 85 07/14/2018 1507   ALKPHOS 53 09/13/2014 1400   AST 12 (L) 07/14/2018 1507   AST 14 09/13/2014 1400   ALT 21 07/14/2018 1507   ALT 13 09/13/2014 1400   BILITOT 0.5 07/14/2018 1507   BILITOT 0.95 09/13/2014 1400        STUDIES: [place] Mfm Ob Follow Up  Result Date: 06/24/2018 ----------------------------------------------------------------------   OBSTETRICS REPORT                      (Signed Final 06/24/2018 01:47 pm) ---------------------------------------------------------------------- Patient Info  ID #:       [id number]                          D.O.B.:  Jun 19, 1981 (36 yrs)  Name:       Bailey Sheppard                Visit Date: 06/24/2018 12:42 pm ---------------------------------------------------------------------- Performed By  Performed By:     Rodrigo Ran BS      Ref. Address:     [address]                                                             [id number]                                                             [place]                                                             [place]  Attending:        Griffin Dakin MD         Location:         [facility]  Referred By:  Sanjuana Kava M ---------------------------------------------------------------------- Orders   #  Description                                 Code   1  [place] MFM OB FOLLOW UP                         [id number]  ----------------------------------------------------------------------   #  Ordered By               Order #        Accession #    Episode #   1  Bailey Sheppard            [id number]      [id number]     [id number]  ---------------------------------------------------------------------- Indications   [redacted] weeks gestation of pregnancy                Z3A.26   Advanced maternal age multigravida 20+,        O60.522   second trimester (low FF Panorama x 2)   Hypertension - Chronic/Pre-existing            O10.019   (labetalol)   Thrombocytopenia affecting pregnancy,          O99.119, D69.6   antepartum; ITP   Uterine fibroids affecting pregnancy in        O34.12, D25.9   second trimester, antepartum   Encounter for other antenatal screening        Z36.2   follow-up  ---------------------------------------------------------------------- OB History  Gravidity:    4         Term:   2        Prem:   0        SAB:   0  TOP:          1       Ectopic:   0        Living: 2 ---------------------------------------------------------------------- Fetal Evaluation  Num Of Fetuses:     1  Fetal Heart         140  Rate(bpm):  Cardiac Activity:   Observed  Presentation:       Breech  Placenta:           Posterior, above cervical os  P. Cord Insertion:  Previously Visualized  Amniotic Fluid  AFI FV:      Subjectively within normal limits                              Largest Pocket(cm)                              5.9 ---------------------------------------------------------------------- Biometry  BPD:      65.2  mm     G. Age:  26w 3d         22  %    CI:        65.27   %    70 - 86                                                          FL/HC:  19.6   %    18.6 - 20.4  HC:      259.2  mm     G. Age:  28w 2d         66  %    HC/AC:      1.12        1.05 - 1.21  AC:      231.5  mm     G. Age:  27w 4d         61  %    FL/BPD:     78.1   %    71 - 87  FL:       50.9  mm     G. Age:  27w 2d         48  %    FL/AC:      22.0   %    20 - 24  HUM:      47.4  mm     G. Age:  27w 6d         69  %  Est. FW:    1071  gm      2 lb 6 oz     65  % ---------------------------------------------------------------------- Gestational Age  LMP:           26w 6d        Date:  12/18/17                 EDD:   09/24/18  U/S Today:     27w 3d                                        EDD:   09/20/18  Best:          26w 6d     Det. By:  LMP  (12/18/17)          EDD:   09/24/18 ---------------------------------------------------------------------- Anatomy  Cranium:               Appears normal         Aortic Arch:            Appears normal  Cavum:                 Appears normal         Ductal Arch:            Appears normal  Ventricles:            Appears normal         Diaphragm:              Appears normal  Choroid Plexus:        Previously seen        Stomach:                Appears normal, left                                                                        sided  Cerebellum:             Previously seen  Abdomen:                Appears normal  Posterior Fossa:       Previously seen        Abdominal Wall:         Previously seen  Nuchal Fold:           Not applicable (>44    Cord Vessels:           Previously seen                         wks GA)  Face:                  Orbits nl; profile     Kidneys:                Appear normal                         prev visualized  Lips:                  Appears normal         Bladder:                Appears normal  Thoracic:              Appears normal         Spine:                  Previously seen  Heart:                 Appears normal         Upper Extremities:      Previously seen                         (4CH, axis, and                         situs)  RVOT:                  Appears normal         Lower Extremities:      Previously seen  LVOT:                  Appears normal  Other:  Female gender previously visualized.  Heels and 5th digit previously          visualized. Open hands previously visualized. Nasal bone previously          visualized. ---------------------------------------------------------------------- Cervix Uterus Adnexa  Uterus  No abnormality visualized.  Left Ovary  Within normal limits.  Right Ovary  Within normal limits.  Cul De Sac:   No free fluid seen.  Adnexa:       No abnormality visualized. ---------------------------------------------------------------------- Impression  Singleton intrauterine pregnancy at 26+6 weeks with AMA  and CHTN  Interval review of the anatomy shows no sonographic  markers for aneuploidy or structural anomalies  All relevant fetal anatomy has been visualized  Amniotic fluid volume is normal  Estimated fetal weight shows growth in the 65th percentile ---------------------------------------------------------------------- Recommendations  Repeat scan for growth in 4 weeks ----------------------------------------------------------------------                 Griffin Dakin, MD Electronically Signed Final  Report   06/24/2018 01:47 pm ----------------------------------------------------------------------  ASSESSMENT: 37 y.o. [place], [place] woman:  1.  ITP, requiring hospitalization in June 2014. Patient was treated with IV dexamethasone and IVIG. She was then on a dexamethasone taper between 06/22/2013 and 07/12/2013. She received 6 weekly doses of rituximab, followed by 2 q. three-week doses, with the final dose given on 08/21/2013.  2. History of iron deficiency anemia, most recent dose of IV Feraheme was given in March 2013.  3. Acute pericarditis - resolved (the patient is still taking Colchicine daily)  4.  Pregnancy, with recurrent iron deficiency and significant thrombocytopenia   PLAN: Bailey Sheppard looks well, and there are no symptoms or signs of eclampsia.  There have been no fever or bleeding and her liver function tests are normal.  LDH is normal and reticulocyte count is not elevated.  In short there is no suggestion of a microangiopathic anemia, TTP, DIC, or HELLP syndrome in this patient with known history of ITP.  I have reviewed her blood film and there are no schistocytes.  There are mild rouleaux.  There are no artifactual platelet clumps.  This confirms the impression above.  There are many ways of dealing with the immune thrombocytopenia.  Previously we used steroids and rituximab.  In general, so long as her platelet count is greater than 50,000 no intervention is needed.  Because she is interested in an epidural, we would like the platelet count to be close to normal, certainly over 702,637, at the time of delivery.  Accordingly I am setting her up for IVIG on 08/27/2018.  I will see her again on 09 11, by which time her platelet count should have normalized and should stay normal through the expected delivery date 09 25.  After that all she needs is routine follow-up of the platelet count.  Bailey Sheppard is also significantly iron deficient.  She is not taking her  prenatal vitamins regularly and she was not able to tolerate iron orally in the past.  Accordingly I am setting her up for Feraheme infusion which she will receive 07 18 and 07 25.  She will have fetal monitoring during the infusions.  I have asked her to make sure to take her prenatal vitamins on a regular basis.    She has a good understanding of the overall plan.  She will let me know if any other problems develop before her next visit here.  Bailey Sheppard, Virgie Dad, MD  07/15/18 4:10 PM Medical Oncology and Hematology [facility] [address] [place], [place] [address] Tel. [phone]    Fax. [phone]  Alice Rieger, am acting as scribe for Chauncey Cruel MD.  I, Lurline Del MD, have reviewed the above documentation for accuracy and completeness, and I agree with the above.

## 2018-07-15 ENCOUNTER — Inpatient Hospital Stay (HOSPITAL_BASED_OUTPATIENT_CLINIC_OR_DEPARTMENT_OTHER): Payer: Managed Care, Other (non HMO) | Admitting: Oncology

## 2018-07-15 VITALS — BP 111/78 | HR 95 | Temp 98.8°F | Resp 18 | Ht 70.5 in | Wt 300.2 lb

## 2018-07-15 DIAGNOSIS — D693 Immune thrombocytopenic purpura: Secondary | ICD-10-CM | POA: Diagnosis not present

## 2018-07-15 DIAGNOSIS — F418 Other specified anxiety disorders: Secondary | ICD-10-CM | POA: Diagnosis not present

## 2018-07-15 DIAGNOSIS — I10 Essential (primary) hypertension: Secondary | ICD-10-CM

## 2018-07-15 DIAGNOSIS — D500 Iron deficiency anemia secondary to blood loss (chronic): Secondary | ICD-10-CM | POA: Diagnosis not present

## 2018-07-15 DIAGNOSIS — Z79899 Other long term (current) drug therapy: Secondary | ICD-10-CM

## 2018-07-15 DIAGNOSIS — Z6841 Body Mass Index (BMI) 40.0 and over, adult: Secondary | ICD-10-CM

## 2018-07-15 LAB — FERRITIN: Ferritin: 6 ng/mL — ABNORMAL LOW (ref 11–307)

## 2018-07-16 ENCOUNTER — Telehealth: Payer: Self-pay | Admitting: Oncology

## 2018-07-16 NOTE — Telephone Encounter (Signed)
Per 7/16 los called patient regarding schedule

## 2018-07-16 NOTE — Telephone Encounter (Signed)
Called regarding 8/28 and 9/4

## 2018-07-16 NOTE — Telephone Encounter (Signed)
Called pt re appts added pet 7/16 sch msg - spoke w/ pt re appts

## 2018-07-17 ENCOUNTER — Inpatient Hospital Stay: Payer: Managed Care, Other (non HMO)

## 2018-07-17 VITALS — BP 118/71 | HR 85 | Temp 98.6°F | Resp 16

## 2018-07-17 DIAGNOSIS — D693 Immune thrombocytopenic purpura: Secondary | ICD-10-CM

## 2018-07-17 DIAGNOSIS — D500 Iron deficiency anemia secondary to blood loss (chronic): Secondary | ICD-10-CM

## 2018-07-17 MED ORDER — FERUMOXYTOL INJECTION 510 MG/17 ML: 510.0000 mg | Freq: Once | INTRAVENOUS | Status: DC

## 2018-07-17 MED ORDER — SODIUM CHLORIDE 0.9 % IV SOLN
510.0000 mg | Freq: Once | INTRAVENOUS | Status: AC
  Administered 2018-07-17: 510 mg via INTRAVENOUS
  Filled 2018-07-17: qty 17

## 2018-07-17 NOTE — Patient Instructions (Signed)

## 2018-07-17 NOTE — Progress Notes (Signed)
1530- Fetal Heart tones assessed and baby's heart rate was 164 BPM.  Mother feels fine and was given risks of IV iron.  Patient aware of risks and Rapid Response OB nurse phone number easily on hand. Bailey Sheppard

## 2018-07-17 NOTE — Progress Notes (Signed)
Fetal heart tones obtained by Abelina Bachelor, RN after iron infusion.  Fetal heart tones, 156.  Patient discharged with no acute distress.

## 2018-07-18 ENCOUNTER — Telehealth: Payer: Self-pay | Admitting: *Deleted

## 2018-07-18 NOTE — Telephone Encounter (Signed)
Faxed ROI to [facility]; release [phone]

## 2018-07-21 NOTE — Progress Notes (Signed)
FMLA successfully faxed to [place] at [phone]. Mailed copy to patient address on file.

## 2018-07-22 ENCOUNTER — Ambulatory Visit (HOSPITAL_COMMUNITY): Payer: Managed Care, Other (non HMO)

## 2018-07-22 ENCOUNTER — Encounter (HOSPITAL_COMMUNITY): Payer: Self-pay

## 2018-07-24 ENCOUNTER — Inpatient Hospital Stay: Payer: Managed Care, Other (non HMO)

## 2018-07-24 VITALS — BP 107/80 | HR 84 | Temp 97.5°F | Resp 16

## 2018-07-24 DIAGNOSIS — D693 Immune thrombocytopenic purpura: Secondary | ICD-10-CM

## 2018-07-24 DIAGNOSIS — D500 Iron deficiency anemia secondary to blood loss (chronic): Secondary | ICD-10-CM | POA: Diagnosis not present

## 2018-07-24 MED ORDER — SODIUM CHLORIDE 0.9 % IV SOLN
INTRAVENOUS | Status: DC
  Administered 2018-07-24: 15:00:00 via INTRAVENOUS
  Filled 2018-07-24: qty 250

## 2018-07-24 MED ORDER — FERUMOXYTOL INJECTION 510 MG/17 ML
510.0000 mg | Freq: Once | INTRAVENOUS | Status: AC
  Administered 2018-07-24: 510 mg via INTRAVENOUS
  Filled 2018-07-24: qty 17

## 2018-07-24 MED ORDER — FERUMOXYTOL INJECTION 510 MG/17 ML: 510.0000 mg | Freq: Once | INTRAVENOUS | Status: DC

## 2018-07-24 NOTE — Patient Instructions (Signed)

## 2018-07-24 NOTE — Progress Notes (Signed)
FHT obtained prior to iron infusion - 134 bpm FHT after iron transfusion -137bpm.

## 2018-08-08 ENCOUNTER — Ambulatory Visit: Payer: Managed Care, Other (non HMO) | Admitting: Obstetrics and Gynecology

## 2018-08-22 ENCOUNTER — Other Ambulatory Visit: Payer: Self-pay | Admitting: Oncology

## 2018-08-26 ENCOUNTER — Other Ambulatory Visit: Payer: Self-pay

## 2018-08-26 DIAGNOSIS — D693 Immune thrombocytopenic purpura: Secondary | ICD-10-CM

## 2018-08-26 DIAGNOSIS — D500 Iron deficiency anemia secondary to blood loss (chronic): Secondary | ICD-10-CM

## 2018-08-26 NOTE — Progress Notes (Signed)
Patient ID: Bailey Sheppard, female   DOB: 1981-09-10, 36 y.o.   MRN: [id number] [place]  [phone]) [phone] [phone]  OFFICE PROGRESS NOTE   ID: Bailey Sheppard   DOB: [birth year]  MR#: [id number]  JME#:[id number]  PCP:  Joneen Caraway, M.D. GYN: Briscoe Deutscher, DO   HISTORY OF PRESENT ILLNESS: From the earlier summary note:  Bailey Sheppard is a  [place], [place] woman who moved from [place] with a documented history of ITP.  She was first diagnosed with ITP by a hematologist in [place] in 2007.  At that time she had a history of cytopenia with associated heavy menstrual bleeding.    She presented to our ED in 02/2012 with an unusual headache and bruises on different areas of her body.  She was seen by Dr. 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 The patient stated while living in [place], her blood counts had been up and down but she only received one course of high-dose prednisone in the prior 6 years. When she had been treated with oral steroids, she responded to the treatment.  Dr. Truddie Coco also noted absent iron stores. Her iron deficiency had been treated with IV iron multiple times.  She moved back to [place] in 2010,  but had not sought medical attention before presenting to the ED in 02/2012.   Dr. Truddie Coco treated Bailey Sheppard with Prednisone  for relapsed ITP.  She had been off steroids since 04/2012 and was being observed. Her platelet counts had normalized. She has a history of having  heavy menses but her menses have been scant if any since having an IUD placed in 04/2012.  Bailey Sheppard called the office of the [place] on 06/08/2013 reporting new areas of bruising in all extremities, especially in thighs, gum bleeding when brushing her teeth, and blood clots from the left nostril. CBC laboratory results obtained at the [facility] revealed a platelet count  of 5000.  Further evaluation at the ED was recommended.  Bailey Sheppard was admitted to the hospital from 06/08/2013 - 06/12/2013 and was diagnosed with relapsed ITP and acute pericarditis.  While hospitalized, the patient received intravenous Dexamethasone and IVIG.  Her platelet counts had normalized to 259,000 following hospitalization.  I last saw the patient on 09/13/2014, with platelet count at that time in the normal range (215 K).   INTERVAL HISTORY: Bailey Sheppard returns today for follow-up of her immune thrombocytopenia.  Since her last visit here she received 2 doses of Feraheme.  She tolerated those with no side effects.  Her hemoglobin has normalized.  She is also here for her ITP.  Her platelet count was in the 50s when I saw her in the hospital but currently it is 111,000.  She is scheduled for IVIG today.    REVIEW OF SYSTEMS: Bailey Sheppard reports that she is due date 09/24/2018. She plans to have a vaginal delivery with an epidural. She believes that she will deliver within the next 2 weeks. She has been resting and preparing. She denies unusual headaches, visual changes, nausea, vomiting, or dizziness. There has been no unusual cough, phlegm production, or pleurisy. There has been no change in bowel or bladder habits. She denies unexplained fatigue or unexplained weight loss, bleeding, rash, or fever. A detailed review of systems was otherwise stable.    PAST MEDICAL HISTORY: Past Medical History:  Diagnosis Date  . Acute pericarditis, unspecified 06/11/2013  .  Anemia   . Anxiety   . Clotting disorder ([place])   . Depression   . Elevated hemoglobin A1c 2018   5.7  . Fibroid   . Fibroids   . Hypertension   . ITP (idiopathic thrombocytopenic purpura)     PAST SURGICAL HISTORY: Past Surgical History:  Procedure Laterality Date  . INTRAUTERINE DEVICE (IUD) INSERTION  04/2012  . UMBILICAL HERNIA REPAIR      FAMILY HISTORY Family History  Problem Relation Age of Onset  .  Hypertension Mother   . Diabetes Father   . Kidney failure Father   . Heart failure Father   . Lupus Cousin   . Diabetes Maternal Grandfather   . Kidney failure Maternal Grandfather   . Diabetes Paternal Grandmother     GYNECOLOGIC HISTORY:   Last menstrual period 12/18/2017.  Positive pregnancy test 01/21/2018  G3 P2, Menarche age 36. Age of first live birth: 66. She is currently 7 months pregnant with her 3rd child, expecting a boy. She plans to deliver vaginally with due date 09 25.  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 At home is her fiance Bailey Sheppard, who is a Software engineer at a private school. They have 2 son together, aged 76 and 16. They are expecting another boy due 09/24/2018.      ADVANCED DIRECTIVES:  Not on file  HEALTH MAINTENANCE: Social History   Tobacco Use  . Smoking status: Never Smoker  . Smokeless tobacco: Never Used  Substance Use Topics  . Alcohol use: No    Frequency: Never  . Drug use: No     Allergies  Allergen Reactions  . Nsaids     ITP    Current Outpatient Medications  Medication Sig Dispense Refill  . acetaminophen (TYLENOL) 500 MG tablet Take 1,000 mg by mouth every 6 (six) hours as needed for pain.     Marland Kitchen labetalol (NORMODYNE) 100 MG tablet Take 1 tablet (100 mg total) by mouth 2 (two) times daily. Come in for blood pressure check 2 weeks after starting. 60 tablet 2  . Prenatal Vit-Fe Fumarate-FA (PRENATAL VITAMIN PO) Take by mouth.    . sertraline (ZOLOFT) 50 MG tablet Take 1 tablet (50 mg total) by mouth daily. 90 tablet 3   No current facility-administered medications for this visit.     OBJECTIVE: Young African American woman in no acute distress Vitals:   08/27/18 0810  BP: 108/74  Pulse: 85  Resp: 18  Temp: (!) 97.5 F (36.4 C)  SpO2: 100%     Body mass index is 41.28 kg/m.      Filed Weights   08/27/18 0810  Weight: 291 lb 12.8 oz (132.4 kg)   ECOG FS: 1 - Symptomatic but completely ambulatory    LAB RESULTS: Lab Results  Component Value Date   WBC 7.0 08/27/2018   NEUTROABS 5.1 08/27/2018   HGB 11.7 08/27/2018   HCT 35.1 08/27/2018   MCV 83.8 08/27/2018   PLT 111 (L) 08/27/2018      Chemistry      Component Value Date/Time   NA 138 08/27/2018 0746   NA 140 09/13/2014 1400   K 3.7 08/27/2018 0746   K 3.7 09/13/2014 1400   CL 107 08/27/2018 0746   CL 105 06/19/2013 0845   CO2 22 08/27/2018 0746   CO2  24 09/13/2014 1400   BUN 5 (L) 08/27/2018 0746   BUN 9.7 09/13/2014 1400   CREATININE 0.58 08/27/2018 0746   CREATININE 0.8 09/13/2014 1400      Component Value Date/Time   CALCIUM 9.2 08/27/2018 0746   CALCIUM 9.1 09/13/2014 1400   ALKPHOS 113 08/27/2018 0746   ALKPHOS 53 09/13/2014 1400   AST 14 (L) 08/27/2018 0746   AST 14 09/13/2014 1400   ALT 25 08/27/2018 0746   ALT 13 09/13/2014 1400   BILITOT 0.6 08/27/2018 0746   BILITOT 0.95 09/13/2014 1400        STUDIES: No results found.   ASSESSMENT: 37 y.o. [place] woman:  1.  ITP, requiring hospitalization in June 2014. Patient was treated with IV dexamethasone and IVIG. She was then on a dexamethasone taper between 06/22/2013 and 07/12/2013. She received 6 weekly doses of rituximab, followed by 2 q. three-week doses, with the final dose given on 08/21/2013.  2. History of iron deficiency anemia,  (a) IV Feraheme was given in March 2013.  (b) received repeat IV Feraheme on 07/17/2018 and 07/24/2018 for a ferritin less than 6  3. Acute pericarditis - resolved (the patient is still taking Colchicine daily)  4.  Pregnancy, with recurrent iron deficiency and significant thrombocytopenia   PLAN: Bailey Sheppard looks very ready to deliver and she thinks it is going to be before 09/24/2018.  Her platelet count is currently just over 100,000.  Even though that would be  adequate as far as epidurals is concerned, I am concerned this could easily drop again to the 50s and 60s and just to make sure she is not going to have a problem with anesthesia I am going ahead and giving her IVIG today.   I anticipate her platelet count will be greater than 200,000 when we retested on 09/03/2018.  It should stay well above 100,000 for the next 2 weeks so I think it will be safe for her to proceed to delivery.  Accordingly she will not return to see me until December.  At that time we will again check her iron studies and platelet count and assuming all is well she will be released from follow-up.  She knows to call for any issues that may develop before the next visit.      Magrinat, Bailey Dad, MD  08/27/18 8:41 AM Medical Oncology and Hematology [facility] [address] [place], [place] [address] Tel. [phone]    Fax. [phone]  Alice Rieger, am acting as scribe for Chauncey Cruel MD.  I, Lurline Del MD, have reviewed the above documentation for accuracy and completeness, and I agree with the above.

## 2018-08-27 ENCOUNTER — Telehealth: Payer: Self-pay | Admitting: Oncology

## 2018-08-27 ENCOUNTER — Ambulatory Visit (HOSPITAL_BASED_OUTPATIENT_CLINIC_OR_DEPARTMENT_OTHER): Payer: Self-pay | Admitting: Medical

## 2018-08-27 ENCOUNTER — Inpatient Hospital Stay: Payer: Managed Care, Other (non HMO) | Attending: Oncology | Admitting: Oncology

## 2018-08-27 ENCOUNTER — Inpatient Hospital Stay: Payer: Managed Care, Other (non HMO)

## 2018-08-27 ENCOUNTER — Ambulatory Visit: Payer: Self-pay | Admitting: Oncology

## 2018-08-27 VITALS — BP 108/74 | HR 85 | Temp 97.5°F | Resp 18 | Ht 70.5 in | Wt 291.8 lb

## 2018-08-27 VITALS — BP 108/69 | HR 105 | Temp 98.0°F | Resp 20

## 2018-08-27 DIAGNOSIS — I10 Essential (primary) hypertension: Secondary | ICD-10-CM

## 2018-08-27 DIAGNOSIS — T8090XA Unspecified complication following infusion and therapeutic injection, initial encounter: Secondary | ICD-10-CM

## 2018-08-27 DIAGNOSIS — Z79899 Other long term (current) drug therapy: Secondary | ICD-10-CM | POA: Insufficient documentation

## 2018-08-27 DIAGNOSIS — D508 Other iron deficiency anemias: Secondary | ICD-10-CM

## 2018-08-27 DIAGNOSIS — F418 Other specified anxiety disorders: Secondary | ICD-10-CM | POA: Diagnosis not present

## 2018-08-27 DIAGNOSIS — Z6841 Body Mass Index (BMI) 40.0 and over, adult: Secondary | ICD-10-CM

## 2018-08-27 DIAGNOSIS — D500 Iron deficiency anemia secondary to blood loss (chronic): Secondary | ICD-10-CM

## 2018-08-27 DIAGNOSIS — Z331 Pregnant state, incidental: Secondary | ICD-10-CM | POA: Diagnosis not present

## 2018-08-27 DIAGNOSIS — D509 Iron deficiency anemia, unspecified: Secondary | ICD-10-CM

## 2018-08-27 DIAGNOSIS — D693 Immune thrombocytopenic purpura: Secondary | ICD-10-CM

## 2018-08-27 LAB — CBC WITH DIFFERENTIAL (CANCER CENTER ONLY)
Basophils Absolute: 0 10*3/uL (ref 0.0–0.1)
Basophils Relative: 0 %
EOS ABS: 0.2 10*3/uL (ref 0.0–0.5)
EOS PCT: 3 %
HCT: 35.1 % (ref 34.8–46.6)
HEMOGLOBIN: 11.7 g/dL (ref 11.6–15.9)
LYMPHS ABS: 1 10*3/uL (ref 0.9–3.3)
LYMPHS PCT: 15 %
MCH: 27.9 pg (ref 25.1–34.0)
MCHC: 33.3 g/dL (ref 31.5–36.0)
MCV: 83.8 fL (ref 79.5–101.0)
Monocytes Absolute: 0.6 10*3/uL (ref 0.1–0.9)
Monocytes Relative: 8 %
NEUTROS PCT: 74 %
Neutro Abs: 5.1 10*3/uL (ref 1.5–6.5)
PLATELETS: 111 10*3/uL — AB (ref 145–400)
RBC: 4.19 MIL/uL (ref 3.70–5.45)
RDW: 19.7 % — ABNORMAL HIGH (ref 11.2–14.5)
WBC: 7 10*3/uL (ref 3.9–10.3)

## 2018-08-27 LAB — CMP (CANCER CENTER ONLY)
ALK PHOS: 113 U/L (ref 38–126)
ALT: 25 U/L (ref 0–44)
AST: 14 U/L — AB (ref 15–41)
Albumin: 3 g/dL — ABNORMAL LOW (ref 3.5–5.0)
Anion gap: 9 (ref 5–15)
BUN: 5 mg/dL — AB (ref 6–20)
CALCIUM: 9.2 mg/dL (ref 8.9–10.3)
CHLORIDE: 107 mmol/L (ref 98–111)
CO2: 22 mmol/L (ref 22–32)
CREATININE: 0.58 mg/dL (ref 0.44–1.00)
GFR, Estimated: >60 mL/min (ref >60)
Glucose, Bld: 99 mg/dL (ref 70–99)
Potassium: 3.7 mmol/L (ref 3.5–5.1)
SODIUM: 138 mmol/L (ref 135–145)
Total Bilirubin: 0.6 mg/dL (ref 0.3–1.2)
Total Protein: 6 g/dL — ABNORMAL LOW (ref 6.5–8.1)

## 2018-08-27 MED ORDER — IMMUNE GLOBULIN (HUMAN) 10 GM/100ML IV SOLN
1.0000 g/kg | Freq: Once | INTRAVENOUS | Status: AC
  Administered 2018-08-27: 130 g via INTRAVENOUS
  Filled 2018-08-27: qty 1200

## 2018-08-27 MED ORDER — ACETAMINOPHEN 325 MG PO TABS
ORAL_TABLET | ORAL | Status: AC
  Filled 2018-08-27: qty 2

## 2018-08-27 MED ORDER — DEXTROSE 5 % IV SOLN
Freq: Once | INTRAVENOUS | Status: AC
  Administered 2018-08-27: 09:00:00 via INTRAVENOUS
  Filled 2018-08-27: qty 250

## 2018-08-27 MED ORDER — SODIUM CHLORIDE 0.9 % IV SOLN
4.0000 mg | Freq: Once | INTRAVENOUS | Status: DC
  Filled 2018-08-27: qty 0.4

## 2018-08-27 MED ORDER — DEXAMETHASONE SODIUM PHOSPHATE 10 MG/ML IJ SOLN
4.0000 mg | Freq: Once | INTRAMUSCULAR | Status: AC
  Administered 2018-08-27: 4 mg via INTRAVENOUS

## 2018-08-27 MED ORDER — ACETAMINOPHEN 325 MG PO TABS
650.0000 mg | ORAL_TABLET | Freq: Once | ORAL | Status: AC
  Administered 2018-08-27: 650 mg via ORAL

## 2018-08-27 MED ORDER — DEXAMETHASONE SODIUM PHOSPHATE 10 MG/ML IJ SOLN
INTRAMUSCULAR | Status: AC
  Filled 2018-08-27: qty 1

## 2018-08-27 NOTE — Patient Instructions (Signed)

## 2018-08-27 NOTE — Telephone Encounter (Signed)
Gave avs and calendar ° °

## 2018-08-27 NOTE — Progress Notes (Signed)
09:05--Fetal heart-tones obtained prior to IVIG infusion by Threasa Beards Rogers-AD: 150 bpm  At 12:08 upon pt returning from bathroom she stated feeling "extremely cold". Observed goosebumps and pt actively shivering/teeth chattering. IVIG infusion stopped at 12:10 and Alfredia Client notified. Received verbal orders to administer 650 mg Tylenol PO. Orders placed and medication administered at 12:17. Lucianne Lei came to assess, and pt still c/o of feeling cold/actively shivering. Right after he left to update Dr. Jana Hakim, pt began vomiting and stated she felt chest pain and short of breath. O2 applied via [place] and EKG obtained which was WNL. When [place] returned received orders for 4 mg Decadron IV. Orders placed and administered at 12:40. Fetal heart-tones assessed again by Renee Pain and were 137 bpm. . Rapid Response OB-RN notified and stated she would be over to assess the pt/fetus as well.   At 12:45 pt reported feeling better. Instructed by Lucianne Lei to monitor the pt for another 15 min, and as long as vitals were stable and she still felt well, she could be D/C'd. Around 12:50 RR-RN arrived and performed her own assessment of pt and fetus. At 13:15 she stated "everything looks good. Baby is reactive and heart rate looks strong". Pt's vitals taken one last time and WNL. Pt stated she felt well and was ready to leave. IV removed intact and pt ambulated out without incident.   Vitals:   08/27/18 1217 08/27/18 1234 08/27/18 1243 08/27/18 1324  BP: 131/72 (!) 127/59 109/77 108/69  Pulse: 93 (!) 107 (!) 102 (!) 105  Resp: 20 20 20 20   Temp: (!) 97.5 F (36.4 C)  98 F (36.7 C) 98 F (36.7 C)  TempSrc: Oral  Oral Oral  SpO2: 100% 100% 100% 100%

## 2018-08-29 NOTE — Progress Notes (Signed)
    DATE:  08/27/2018                                          X INFUSION REACTION             MD: Dr. Jana Hakim   AGENT/BLOOD PRODUCT RECEIVING TODAY:               IVIG   AGENT/BLOOD PRODUCT RECEIVING IMMEDIATELY PRIOR TO REACTION:           IVIG   VS: BP:      131/72   P:        95       SPO2:        100% on room air                BP:      104/86    Fetal heart rate: 137 bpm   REACTION(S):            Diaphoresis and shortness of breath   PREMEDS:      None   INTERVENTION: Dexamethasone 4 mg IV x1 and Tylenol 650 mg p.o. x1   Review of Systems  Review of Systems  Constitutional: Positive for diaphoresis. Negative for chills and fever.  HENT: Negative for trouble swallowing and voice change.   Respiratory: Positive for shortness of breath. Negative for cough, chest tightness and wheezing.   Cardiovascular: Negative for chest pain and palpitations.  Gastrointestinal: Negative for abdominal pain, constipation, diarrhea, nausea and vomiting.  Musculoskeletal: Negative for back pain and myalgias.  Neurological: Negative for dizziness, light-headedness and headaches.     Physical Exam  Physical Exam  Constitutional: No distress.  HENT:  Head: Normocephalic and atraumatic.  Cardiovascular: Normal rate, regular rhythm and normal heart sounds. Exam reveals no gallop and no friction rub.  No murmur heard. Pulmonary/Chest: Effort normal and breath sounds normal. No respiratory distress. She has no wheezes. She has no rales.  Abdominal:  Abdomen is gravid.  Fetal heart tones are regular at 137 bpm.  Neurological: She is alert.  Skin: Skin is warm. No rash noted. She is diaphoretic. No erythema.    OUTCOME:      The patient's symptoms resolved after receiving Tylenol 650 mg p.o. x1 and Decadron 4 mg IV x1.  An EKG returned showing sinus tachycardia at 107 bpm with a T wave abnormality.  The patient's obstetrician was contacted.  They asked that the patient come to their office  for a rhythm strip.  Her fetal heart tones rhythm remains stable at 137.  This case was discussed with Dr. Jana Hakim who ordered that the patient would not receive additional IVIG.  Her infusion was stopped.         This case was discussed with Dr. Jana Hakim. He expressed his agreement with my management of this patient.       Sandi Mealy, MHS, PA-C

## 2018-09-02 ENCOUNTER — Other Ambulatory Visit: Payer: Self-pay

## 2018-09-02 DIAGNOSIS — D693 Immune thrombocytopenic purpura: Secondary | ICD-10-CM

## 2018-09-03 ENCOUNTER — Ambulatory Visit: Payer: Self-pay

## 2018-09-03 ENCOUNTER — Inpatient Hospital Stay: Payer: Managed Care, Other (non HMO) | Attending: Oncology

## 2018-09-03 DIAGNOSIS — D693 Immune thrombocytopenic purpura: Secondary | ICD-10-CM | POA: Diagnosis not present

## 2018-09-03 LAB — CMP (CANCER CENTER ONLY)
ALT: 29 U/L (ref 0–44)
AST: 15 U/L (ref 15–41)
Albumin: 3 g/dL — ABNORMAL LOW (ref 3.5–5.0)
Alkaline Phosphatase: 120 U/L (ref 38–126)
Anion gap: 8 (ref 5–15)
BILIRUBIN TOTAL: 0.8 mg/dL (ref 0.3–1.2)
BUN: 4 mg/dL — AB (ref 6–20)
CHLORIDE: 107 mmol/L (ref 98–111)
CO2: 23 mmol/L (ref 22–32)
Calcium: 9.7 mg/dL (ref 8.9–10.3)
Creatinine: 0.61 mg/dL (ref 0.44–1.00)
GFR, Est AFR Am: >60 mL/min (ref >60)
Glucose, Bld: 95 mg/dL (ref 70–99)
Potassium: 3.8 mmol/L (ref 3.5–5.1)
Sodium: 138 mmol/L (ref 135–145)
Total Protein: 6.4 g/dL — ABNORMAL LOW (ref 6.5–8.1)

## 2018-09-03 LAB — CBC WITH DIFFERENTIAL (CANCER CENTER ONLY)
Basophils Absolute: 0 10*3/uL (ref 0.0–0.1)
Basophils Relative: 0 %
EOS ABS: 0.3 10*3/uL (ref 0.0–0.5)
EOS PCT: 4 %
HCT: 34.7 % — ABNORMAL LOW (ref 34.8–46.6)
Hemoglobin: 11.8 g/dL (ref 11.6–15.9)
LYMPHS ABS: 1.4 10*3/uL (ref 0.9–3.3)
Lymphocytes Relative: 18 %
MCH: 28.4 pg (ref 25.1–34.0)
MCHC: 34 g/dL (ref 31.5–36.0)
MCV: 83.6 fL (ref 79.5–101.0)
Monocytes Absolute: 0.6 10*3/uL (ref 0.1–0.9)
Monocytes Relative: 8 %
Neutro Abs: 5.5 10*3/uL (ref 1.5–6.5)
Neutrophils Relative %: 70 %
PLATELETS: 158 10*3/uL (ref 145–400)
RBC: 4.15 MIL/uL (ref 3.70–5.45)
RDW: 18.1 % — AB (ref 11.2–14.5)
WBC: 7.8 10*3/uL (ref 3.9–10.3)

## 2018-09-05 ENCOUNTER — Encounter (HOSPITAL_COMMUNITY): Payer: Self-pay | Admitting: *Deleted

## 2018-09-05 ENCOUNTER — Telehealth (HOSPITAL_COMMUNITY): Payer: Self-pay | Admitting: *Deleted

## 2018-09-05 NOTE — Telephone Encounter (Signed)
Preadmission screen  

## 2018-09-08 ENCOUNTER — Encounter (HOSPITAL_COMMUNITY): Payer: Self-pay | Admitting: *Deleted

## 2018-09-09 ENCOUNTER — Other Ambulatory Visit: Payer: Self-pay | Admitting: *Deleted

## 2018-09-09 DIAGNOSIS — D509 Iron deficiency anemia, unspecified: Secondary | ICD-10-CM

## 2018-09-09 DIAGNOSIS — D693 Immune thrombocytopenic purpura: Secondary | ICD-10-CM

## 2018-09-10 ENCOUNTER — Inpatient Hospital Stay: Payer: Managed Care, Other (non HMO)

## 2018-09-10 DIAGNOSIS — D693 Immune thrombocytopenic purpura: Secondary | ICD-10-CM

## 2018-09-10 DIAGNOSIS — D509 Iron deficiency anemia, unspecified: Secondary | ICD-10-CM

## 2018-09-10 LAB — CBC WITH DIFFERENTIAL (CANCER CENTER ONLY)
Basophils Absolute: 0 10*3/uL (ref 0.0–0.1)
Basophils Relative: 0 %
EOS ABS: 0.3 10*3/uL (ref 0.0–0.5)
EOS PCT: 4 %
HCT: 36.1 % (ref 34.8–46.6)
HEMOGLOBIN: 12.1 g/dL (ref 11.6–15.9)
LYMPHS ABS: 0.8 10*3/uL — AB (ref 0.9–3.3)
Lymphocytes Relative: 11 %
MCH: 28.1 pg (ref 25.1–34.0)
MCHC: 33.4 g/dL (ref 31.5–36.0)
MCV: 84.2 fL (ref 79.5–101.0)
MONOS PCT: 8 %
Monocytes Absolute: 0.6 10*3/uL (ref 0.1–0.9)
NEUTROS PCT: 77 %
Neutro Abs: 6 10*3/uL (ref 1.5–6.5)
PLATELETS: 116 10*3/uL — AB (ref 145–400)
RBC: 4.29 MIL/uL (ref 3.70–5.45)
RDW: 19.1 % — AB (ref 11.2–14.5)
WBC Count: 7.7 10*3/uL (ref 3.9–10.3)

## 2018-09-10 LAB — FERRITIN: Ferritin: 66 ng/mL (ref 11–307)

## 2018-09-12 ENCOUNTER — Other Ambulatory Visit: Payer: Self-pay | Admitting: Obstetrics and Gynecology

## 2018-09-15 ENCOUNTER — Telehealth: Payer: Self-pay | Admitting: *Deleted

## 2018-09-15 NOTE — Telephone Encounter (Signed)
Pt had labs drawn on 9/11 with platelet count 116,000.   Per chart review noted pt is due to be induced 09/17/2018.  Pt has appointment scheduled at this office on 09/17/2018 for IVIG.  Pt's labs with attending MD's recommendations per prior office visit reviewed with covering MD. Noted reaction to IVIG with last infusion also reviewed.  Per covering MD - no further IVIG recommended at this time per noted current platelet count greater then 100,000.  Pt is scheduled for follow up post delivery with MD for any further therapy.  This RN spoke with pt as well as called above to [place].

## 2018-09-17 ENCOUNTER — Other Ambulatory Visit: Payer: Self-pay

## 2018-09-17 ENCOUNTER — Encounter (HOSPITAL_COMMUNITY): Payer: Self-pay

## 2018-09-17 ENCOUNTER — Inpatient Hospital Stay (HOSPITAL_COMMUNITY)
Admission: RE | Admit: 2018-09-17 | Discharge: 2018-09-19 | DRG: 806 | Disposition: A | Discharge status: Home / Self Care | Payer: Managed Care, Other (non HMO) | Attending: Obstetrics and Gynecology | Admitting: Obstetrics and Gynecology

## 2018-09-17 ENCOUNTER — Inpatient Hospital Stay (HOSPITAL_COMMUNITY): Payer: Managed Care, Other (non HMO) | Admitting: Anesthesiology

## 2018-09-17 ENCOUNTER — Ambulatory Visit: Payer: Self-pay

## 2018-09-17 VITALS — BP 120/60 | HR 79 | Temp 98.0°F | Resp 16 | Ht 70.0 in | Wt 293.1 lb

## 2018-09-17 DIAGNOSIS — F329 Major depressive disorder, single episode, unspecified: Secondary | ICD-10-CM

## 2018-09-17 DIAGNOSIS — Z3A39 39 weeks gestation of pregnancy: Secondary | ICD-10-CM | POA: Diagnosis not present

## 2018-09-17 DIAGNOSIS — O1002 Pre-existing essential hypertension complicating childbirth: Principal | ICD-10-CM | POA: Diagnosis present

## 2018-09-17 DIAGNOSIS — D693 Immune thrombocytopenic purpura: Secondary | ICD-10-CM | POA: Diagnosis present

## 2018-09-17 DIAGNOSIS — O99214 Obesity complicating childbirth: Secondary | ICD-10-CM | POA: Diagnosis present

## 2018-09-17 DIAGNOSIS — O9912 Other diseases of the blood and blood-forming organs and certain disorders involving the immune mechanism complicating childbirth: Secondary | ICD-10-CM | POA: Diagnosis present

## 2018-09-17 DIAGNOSIS — Z23 Encounter for immunization: Secondary | ICD-10-CM | POA: Diagnosis not present

## 2018-09-17 DIAGNOSIS — O10919 Unspecified pre-existing hypertension complicating pregnancy, unspecified trimester: Secondary | ICD-10-CM | POA: Diagnosis present

## 2018-09-17 DIAGNOSIS — O99824 Streptococcus B carrier state complicating childbirth: Secondary | ICD-10-CM | POA: Diagnosis present

## 2018-09-17 DIAGNOSIS — F32A Depression, unspecified: Secondary | ICD-10-CM

## 2018-09-17 DIAGNOSIS — O99340 Other mental disorders complicating pregnancy, unspecified trimester: Secondary | ICD-10-CM

## 2018-09-17 HISTORY — DX: Unspecified pre-existing hypertension complicating pregnancy, unspecified trimester: O10.919

## 2018-09-17 LAB — CBC
HCT: 33 % — ABNORMAL LOW (ref 36.0–46.0)
HEMATOCRIT: 35.9 % — AB (ref 36.0–46.0)
HEMOGLOBIN: 11.3 g/dL — AB (ref 12.0–15.0)
Hemoglobin: 12.2 g/dL (ref 12.0–15.0)
MCH: 28.5 pg (ref 26.0–34.0)
MCH: 28.3 pg (ref 26.0–34.0)
MCHC: 34 g/dL (ref 30.0–36.0)
MCHC: 34.2 g/dL (ref 30.0–36.0)
MCV: 83.9 fL (ref 78.0–100.0)
MCV: 82.7 fL (ref 78.0–100.0)
PLATELETS: 137 10*3/uL — AB (ref 150–400)
Platelets: 133 10*3/uL — ABNORMAL LOW (ref 150–400)
RBC: 4.28 MIL/uL (ref 3.87–5.11)
RBC: 3.99 MIL/uL (ref 3.87–5.11)
RDW: 17.7 % — ABNORMAL HIGH (ref 11.5–15.5)
RDW: 17.5 % — ABNORMAL HIGH (ref 11.5–15.5)
WBC: 8.1 10*3/uL (ref 4.0–10.5)
WBC: 10.3 10*3/uL (ref 4.0–10.5)

## 2018-09-17 LAB — COMPREHENSIVE METABOLIC PANEL
ALT: 19 U/L (ref 0–44)
ANION GAP: 8 (ref 5–15)
AST: 15 U/L (ref 15–41)
Albumin: 3.1 g/dL — ABNORMAL LOW (ref 3.5–5.0)
Alkaline Phosphatase: 118 U/L (ref 38–126)
BILIRUBIN TOTAL: 1.2 mg/dL (ref 0.3–1.2)
BUN: 6 mg/dL (ref 6–20)
CHLORIDE: 107 mmol/L (ref 98–111)
CO2: 21 mmol/L — AB (ref 22–32)
Calcium: 8.9 mg/dL (ref 8.9–10.3)
Creatinine, Ser: 0.45 mg/dL (ref 0.44–1.00)
GFR calc Af Amer: >60 mL/min (ref >60)
GFR calc non Af Amer: >60 mL/min (ref >60)
Glucose, Bld: 91 mg/dL (ref 70–99)
Potassium: 3.7 mmol/L (ref 3.5–5.1)
SODIUM: 136 mmol/L (ref 135–145)
TOTAL PROTEIN: 5.8 g/dL — AB (ref 6.5–8.1)

## 2018-09-17 LAB — ABO/RH: ABO/RH(D): A POS

## 2018-09-17 LAB — PROTEIN / CREATININE RATIO, URINE
Creatinine, Urine: 141 mg/dL
Protein Creatinine Ratio: 0.14 mg/mg{Cre} (ref 0.00–0.15)
TOTAL PROTEIN, URINE: 20 mg/dL

## 2018-09-17 LAB — RPR: RPR Ser Ql: NONREACTIVE

## 2018-09-17 LAB — TYPE AND SCREEN
ABO/RH(D): A POS
ANTIBODY SCREEN: NEGATIVE

## 2018-09-17 LAB — URIC ACID: Uric Acid, Serum: 2.3 mg/dL — ABNORMAL LOW (ref 2.5–7.1)

## 2018-09-17 MED ORDER — TERBUTALINE SULFATE 1 MG/ML IJ SOLN
0.2500 mg | Freq: Once | INTRAMUSCULAR | Status: DC | PRN
  Filled 2018-09-17: qty 1

## 2018-09-17 MED ORDER — DIPHENHYDRAMINE HCL 50 MG/ML IJ SOLN: 12.5000 mg | INTRAMUSCULAR | Status: DC | PRN

## 2018-09-17 MED ORDER — ONDANSETRON HCL 4 MG/2ML IJ SOLN: 4.0000 mg | Freq: Four times a day (QID) | INTRAMUSCULAR | Status: DC | PRN

## 2018-09-17 MED ORDER — SODIUM BICARBONATE 8.4 % IV SOLN
INTRAVENOUS | Status: DC | PRN
  Administered 2018-09-17 (×2): 4 mL via EPIDURAL

## 2018-09-17 MED ORDER — LIDOCAINE HCL (PF) 1 % IJ SOLN
30.0000 mL | INTRAMUSCULAR | Status: DC | PRN
  Filled 2018-09-17: qty 30

## 2018-09-17 MED ORDER — MISOPROSTOL 25 MCG QUARTER TABLET
25.0000 ug | ORAL_TABLET | ORAL | Status: DC | PRN
  Administered 2018-09-17: 25 ug via VAGINAL
  Filled 2018-09-17 (×2): qty 1

## 2018-09-17 MED ORDER — LACTATED RINGERS IV SOLN
INTRAVENOUS | Status: DC
  Administered 2018-09-17 (×2): via INTRAVENOUS

## 2018-09-17 MED ORDER — FLEET ENEMA 7-19 GM/118ML RE ENEM: 1.0000 | ENEMA | RECTAL | Status: DC | PRN

## 2018-09-17 MED ORDER — FENTANYL 2.5 MCG/ML BUPIVACAINE 1/10 % EPIDURAL INFUSION (WH - ANES)
14.0000 mL/h | INTRAMUSCULAR | Status: DC | PRN
  Administered 2018-09-17: 14 mL/h via EPIDURAL
  Filled 2018-09-17: qty 100

## 2018-09-17 MED ORDER — SOD CITRATE-CITRIC ACID 500-334 MG/5ML PO SOLN: 30.0000 mL | ORAL | Status: DC | PRN

## 2018-09-17 MED ORDER — PENICILLIN G 3 MILLION UNITS IVPB - SIMPLE MED
3.0000 10*6.[IU] | INTRAVENOUS | Status: DC
  Administered 2018-09-17 (×3): 3 10*6.[IU] via INTRAVENOUS
  Filled 2018-09-17 (×6): qty 100

## 2018-09-17 MED ORDER — FENTANYL CITRATE (PF) 100 MCG/2ML IJ SOLN: 50.0000 ug | INTRAMUSCULAR | Status: DC | PRN

## 2018-09-17 MED ORDER — OXYTOCIN 40 UNITS IN LACTATED RINGERS INFUSION - SIMPLE MED
1.0000 m[IU]/min | INTRAVENOUS | Status: DC
  Administered 2018-09-17: 1 m[IU]/min via INTRAVENOUS
  Filled 2018-09-17: qty 1000

## 2018-09-17 MED ORDER — ACETAMINOPHEN 325 MG PO TABS: 650.0000 mg | ORAL_TABLET | ORAL | Status: DC | PRN

## 2018-09-17 MED ORDER — OXYTOCIN 40 UNITS IN LACTATED RINGERS INFUSION - SIMPLE MED
2.5000 [IU]/h | INTRAVENOUS | Status: DC
  Administered 2018-09-17: 2.5 [IU]/h via INTRAVENOUS

## 2018-09-17 MED ORDER — LACTATED RINGERS IV SOLN: 500.0000 mL | INTRAVENOUS | Status: DC | PRN

## 2018-09-17 MED ORDER — SODIUM CHLORIDE 0.9 % IV SOLN
5.0000 10*6.[IU] | Freq: Once | INTRAVENOUS | Status: AC
  Administered 2018-09-17: 5 10*6.[IU] via INTRAVENOUS
  Filled 2018-09-17: qty 5

## 2018-09-17 MED ORDER — PHENYLEPHRINE 40 MCG/ML (10ML) SYRINGE FOR IV PUSH (FOR BLOOD PRESSURE SUPPORT)
80.0000 ug | PREFILLED_SYRINGE | INTRAVENOUS | Status: DC | PRN
  Filled 2018-09-17: qty 5
  Filled 2018-09-17: qty 10

## 2018-09-17 MED ORDER — EPHEDRINE 5 MG/ML INJ
10.0000 mg | INTRAVENOUS | Status: DC | PRN
  Filled 2018-09-17: qty 2

## 2018-09-17 MED ORDER — OXYTOCIN BOLUS FROM INFUSION
500.0000 mL | Freq: Once | INTRAVENOUS | Status: AC
  Administered 2018-09-17: 500 mL via INTRAVENOUS

## 2018-09-17 MED ORDER — PHENYLEPHRINE 40 MCG/ML (10ML) SYRINGE FOR IV PUSH (FOR BLOOD PRESSURE SUPPORT)
80.0000 ug | PREFILLED_SYRINGE | INTRAVENOUS | Status: DC | PRN
  Filled 2018-09-17: qty 10
  Filled 2018-09-17: qty 5

## 2018-09-17 MED ORDER — LACTATED RINGERS IV SOLN: 500.0000 mL | Freq: Once | INTRAVENOUS | Status: DC

## 2018-09-17 NOTE — Anesthesia Preprocedure Evaluation (Signed)
Anesthesia Evaluation  Patient identified by MRN, date of birth, ID band Patient awake    Reviewed: Allergy & Precautions, NPO status , Patient's Chart, lab work & pertinent test results  Airway Mallampati: II  TM Distance: >3 FB Neck ROM: Full    Dental no notable dental hx. (+) Teeth Intact   Pulmonary    Pulmonary exam normal breath sounds clear to auscultation       Cardiovascular hypertension, Pt. on medications Normal cardiovascular exam Rhythm:Regular Rate:Normal     Neuro/Psych negative neurological ROS  negative psych ROS   GI/Hepatic negative GI ROS, Neg liver ROS,   Endo/Other  negative endocrine ROS  Renal/GU negative Renal ROS  negative genitourinary   Musculoskeletal negative musculoskeletal ROS (+)   Abdominal   Peds  Hematology ITP, received IVIG in August, plt 116 today   Anesthesia Other Findings   Reproductive/Obstetrics (+) Pregnancy                             Anesthesia Physical Anesthesia Plan  ASA: III  Anesthesia Plan: Epidural   Post-op Pain Management:    Induction:   PONV Risk Score and Plan: Treatment may vary due to age or medical condition  Airway Management Planned: Natural Airway  Additional Equipment:   Intra-op Plan:   Post-operative Plan:   Informed Consent: I have reviewed the patients History and Physical, chart, labs and discussed the procedure including the risks, benefits and alternatives for the proposed anesthesia with the patient or authorized representative who has indicated his/her understanding and acceptance.     Plan Discussed with: Anesthesiologist  Anesthesia Plan Comments: (Patient identified. Risks, benefits, options discussed with patient including but not limited to bleeding, infection, nerve damage, paralysis, failed block, incomplete pain control, headache, blood pressure changes, nausea, vomiting, reactions to  medication, itching, and post partum back pain. Confirmed with bedside nurse the patient's most recent platelet count. Confirmed with the patient that they are not taking any anticoagulation, have any bleeding history or any family history of bleeding disorders. Patient expressed understanding and wishes to proceed. All questions were answered. )        Anesthesia Quick Evaluation

## 2018-09-17 NOTE — H&P (Signed)
Bailey Sheppard is a 37 y.o. female presenting for induction of labor d/t CHTN controlled on labetalol.  Denies HA, visual changes or abdominal pain.  Reports good FM, no LOF and no VB.  Pt has h/o ITP and is s/p IGG with hematology with good response.  OB History    Gravida  4   Para  2   Term  2   Preterm  0   AB  1   Living  2     SAB  0   TAB  1   Ectopic  0   Multiple  0   Live Births  0          Past Medical History:  Diagnosis Date  . Acute pericarditis, unspecified 06/11/2013  . AMA (advanced maternal age) multigravida 61+   . Anemia   . Anxiety   . Clotting disorder ([place])   . Depression   . Elevated hemoglobin A1c 2018   5.7  . Fibroid   . Fibroids   . Hypertension   . ITP (idiopathic thrombocytopenic purpura)   . Obesity   . Vaginal Pap smear, abnormal    Past Surgical History:  Procedure Laterality Date  . INTRAUTERINE DEVICE (IUD) INSERTION  04/2012  . UMBILICAL HERNIA REPAIR     Family History: family history includes Diabetes in her father, maternal grandfather, and paternal grandmother; Heart failure in her father; Hypertension in her mother; Kidney failure in her father and maternal grandfather; Lupus in her cousin. Social History:  reports that she has never smoked. She has never used smokeless tobacco. She reports that she does not drink alcohol or use drugs.     Maternal Diabetes: No Genetic Screening: Normal AFP but cells were insufficient on Panorama and pt declined further testing Maternal Ultrasounds/Referrals: Normal Fetal Ultrasounds or other Referrals:  None Maternal Substance Abuse:  No Significant Maternal Medications:  Meds include: Other: labetalol, sertraline Significant Maternal Lab Results:  Lab values include: Group B Strep positive Other Comments:  h/o ITP s/p IGG in August with good response  ROS  Non-contributory - denies F/C/N/V/D History Dilation: 1.5 Effacement (%): Thick Station: -3 Exam by:: Krystal Eaton  RN  Blood pressure 120/64, pulse 87, temperature 98.1 F (36.7 C), temperature source Oral, resp. rate 18, height 5\' 10"  (1.778 m), weight 132.9 kg, last menstrual period 12/18/2017. Exam Physical Exam  Lungs CTA bilateral  CV RRR Abdomen gravid, NT Ext no calf tenderness FHT 135, + accels, no decels, moderate variability Toco q5-7  Prenatal labs: ABO, Rh: --/--/A POS (09/18 0800) Antibody: NEG (09/18 0800) Rubella: Immune (02/12 0000) RPR: Nonreactive (02/12 0000)  HBsAg: Negative (02/12 0000)  HIV: Non-reactive (02/12 0000)  GBS:   Positive  Assessment/Plan: P2 at 39wks with CHTN controlled on labetalol and h/o ITP s/p IGG in August with good response. GBS positive with plan for penicillin.  Cervix is unfavorable (similar to RN exam I called pt 1/50/-3, vtx), will plan cytotec.  Fetal status is cat 1.    Delice Lesch 09/17/2018, 9:40 AM  10:00 cytotec placed by me.

## 2018-09-17 NOTE — Anesthesia Pain Management Evaluation Note (Signed)
  CRNA Pain Management Visit Note  Patient: Bailey Sheppard, 37 y.o., female  "Hello I am a member of the anesthesia team at [facility]. We have an anesthesia team available at all times to provide care throughout the hospital, including epidural management and anesthesia for C-section. I don't know your plan for the delivery whether it a natural birth, water birth, IV sedation, nitrous supplementation, doula or epidural, but we want to meet your pain goals."   1.Was your pain managed to your expectations on prior hospitalizations?   Yes   2.What is your expectation for pain management during this hospitalization?     Epidural and IV pain meds  3.How can we help you reach that goal? Be available  Record the patient's initial score and the patient's pain goal.   Pain: 1  Pain Goal: 5 [facility] wants you to be able to say your pain was always managed very well.  [facility] 09/17/2018

## 2018-09-17 NOTE — Progress Notes (Signed)
Subjective: Pt is comfortable with epidura  Objective: BP 99/70   Pulse 89   Temp (!) 97.4 F (36.3 C) (Oral)   Resp 16   Ht 5\' 10"  (1.778 m)   Wt 132.9 kg   LMP 12/18/2017 (Exact Date)   SpO2 100%   BMI 42.06 kg/m  I/O last 3 completed shifts: In: 1336.5 [I.V.:1336.5] Out: -  No intake/output data recorded.  FHT: Category 2  FHT 140 with variables to 90s with contractions UC:   regular, every 2-3 minutes SVE:   Dilation: 5 Effacement (%): 60, 70 Station: -2, -3 Exam by:: Krystal Eaton RN    Assessment:  [phone] CHTN on labetalol and history of ITP GBS positive Cat 2 strip  Plan: Monitor EFM Anticipate SVD  Starla Link CNM, MSN 09/17/2018, 8:11 PM

## 2018-09-17 NOTE — Anesthesia Procedure Notes (Signed)
Epidural Patient location during procedure: OB Start time: 09/17/2018 6:25 PM End time: 09/17/2018 6:40 PM  Staffing Anesthesiologist: Freddrick March, MD Performed: anesthesiologist   Preanesthetic Checklist Completed: patient identified, pre-op evaluation, timeout performed, IV checked, risks and benefits discussed and monitors and equipment checked  Epidural Patient position: sitting Prep: site prepped and draped and DuraPrep Patient monitoring: continuous pulse ox, blood pressure, heart rate and cardiac monitor Approach: midline Location: L3-L4 Injection technique: LOR air  Needle:  Needle type: Tuohy  Needle gauge: 17 G Needle length: 9 cm Needle insertion depth: 8 cm Catheter type: closed end flexible Catheter size: 19 Gauge Catheter at skin depth: 14 cm Test dose: negative  Assessment Sensory level: T8 Events: blood not aspirated, injection not painful, no injection resistance, negative IV test and no paresthesia  Additional Notes Patient identified. Risks/Benefits/Options discussed with patient including but not limited to bleeding, infection, nerve damage, paralysis, failed block, incomplete pain control, headache, blood pressure changes, nausea, vomiting, reactions to medication both or allergic, itching and postpartum back pain. Confirmed with bedside nurse the patient's most recent platelet count. Confirmed with patient that they are not currently taking any anticoagulation, have any bleeding history or any family history of bleeding disorders. Patient expressed understanding and wished to proceed. All questions were answered. Sterile technique was used throughout the entire procedure. Please see nursing notes for vital signs. Test dose was given through epidural catheter and negative prior to continuing to dose epidural or start infusion. Warning signs of high block given to the patient including shortness of breath, tingling/numbness in hands, complete motor block,  or any concerning symptoms with instructions to call for help. Patient was given instructions on fall risk and not to get out of bed. All questions and concerns addressed with instructions to call with any issues or inadequate analgesia.  Reason for block:procedure for pain

## 2018-09-17 NOTE — Progress Notes (Signed)
Bailey Sheppard is a 37 y.o. [id number] at [redacted]w[redacted]d admitted for indxn d/t chtn with h/o ITP.  Subjective: C/o pain and desires epidural  Objective: BP 110/78   Pulse 90   Temp 98.5 F (36.9 C) (Oral)   Resp 18   Ht 5\' 10"  (1.778 m)   Wt 132.9 kg   LMP 12/18/2017 (Exact Date)   SpO2 100%   BMI 42.06 kg/m  I/O last 3 completed shifts: In: 1336.5 [I.V.:1336.5] Out: -  Total I/O In: 564.7 [I.V.:564.7] Out: -     FHT:  FHR: 130 bpm, variability: moderate,  accelerations:  Present,  decelerations:  Absent UC:   regular SVE:   Dilation: 4 Effacement (%): 50 Station: -3 Exam by:: A. Junius Faucett AROM clear fluid   Labs: Lab Results  Component Value Date   WBC 8.1 09/17/2018   HGB 12.2 09/17/2018   HCT 35.9 (L) 09/17/2018   MCV 83.9 09/17/2018   PLT 137 (L) 09/17/2018    Assessment / Plan: Induction progressing s/p cytotec x 1 and on pitocin with regular ctxs  Labor: AROM clear fluid, halve pitocin to avoid hyperstim/fetal intolerance Preeclampsia:  no signs or symptoms of toxicity Fetal Wellbeing:  Category I Pain Control:  requesting epidural.  will consult anesthesia I/D:  GBS+ on PCN Anticipated MOD:  NSVD  Delice Lesch 09/17/2018, 9:43 PM

## 2018-09-18 ENCOUNTER — Other Ambulatory Visit: Payer: Self-pay

## 2018-09-18 LAB — CBC
HCT: 31 % — ABNORMAL LOW (ref 36.0–46.0)
Hemoglobin: 10.8 g/dL — ABNORMAL LOW (ref 12.0–15.0)
MCH: 28.6 pg (ref 26.0–34.0)
MCHC: 34.8 g/dL (ref 30.0–36.0)
MCV: 82.2 fL (ref 78.0–100.0)
PLATELETS: 125 10*3/uL — AB (ref 150–400)
RBC: 3.77 MIL/uL — AB (ref 3.87–5.11)
RDW: 17.5 % — ABNORMAL HIGH (ref 11.5–15.5)
WBC: 9.9 10*3/uL (ref 4.0–10.5)

## 2018-09-18 MED ORDER — SENNOSIDES-DOCUSATE SODIUM 8.6-50 MG PO TABS
2.0000 | ORAL_TABLET | ORAL | Status: DC
  Administered 2018-09-18 – 2018-09-19 (×2): 2 via ORAL
  Filled 2018-09-18 (×2): qty 2

## 2018-09-18 MED ORDER — ONDANSETRON HCL 4 MG/2ML IJ SOLN: 4.0000 mg | INTRAMUSCULAR | Status: DC | PRN

## 2018-09-18 MED ORDER — SERTRALINE HCL 50 MG PO TABS
50.0000 mg | ORAL_TABLET | Freq: Every day | ORAL | Status: DC
  Administered 2018-09-19: 50 mg via ORAL
  Filled 2018-09-18 (×2): qty 1

## 2018-09-18 MED ORDER — ZOLPIDEM TARTRATE 5 MG PO TABS: 5.0000 mg | ORAL_TABLET | Freq: Every evening | ORAL | Status: DC | PRN

## 2018-09-18 MED ORDER — SIMETHICONE 80 MG PO CHEW: 80.0000 mg | CHEWABLE_TABLET | ORAL | Status: DC | PRN

## 2018-09-18 MED ORDER — LABETALOL HCL 100 MG PO TABS
100.0000 mg | ORAL_TABLET | Freq: Two times a day (BID) | ORAL | Status: DC
  Administered 2018-09-18 – 2018-09-19 (×4): 100 mg via ORAL
  Filled 2018-09-18 (×4): qty 1

## 2018-09-18 MED ORDER — DIPHENHYDRAMINE HCL 25 MG PO CAPS: 25.0000 mg | ORAL_CAPSULE | Freq: Four times a day (QID) | ORAL | Status: DC | PRN

## 2018-09-18 MED ORDER — PRENATAL MULTIVITAMIN CH
1.0000 | ORAL_TABLET | Freq: Every day | ORAL | Status: DC
  Administered 2018-09-18 – 2018-09-19 (×2): 1 via ORAL
  Filled 2018-09-18 (×2): qty 1

## 2018-09-18 MED ORDER — COCONUT OIL OIL: 1.0000 "application " | TOPICAL_OIL | Status: DC | PRN

## 2018-09-18 MED ORDER — TETANUS-DIPHTH-ACELL PERTUSSIS 5-2.5-18.5 LF-MCG/0.5 IM SUSP: 0.5000 mL | Freq: Once | INTRAMUSCULAR | Status: DC

## 2018-09-18 MED ORDER — ACETAMINOPHEN 325 MG PO TABS
650.0000 mg | ORAL_TABLET | ORAL | Status: DC | PRN
  Filled 2018-09-18: qty 2

## 2018-09-18 MED ORDER — INFLUENZA VAC SPLIT QUAD 0.5 ML IM SUSY
0.5000 mL | PREFILLED_SYRINGE | INTRAMUSCULAR | Status: AC
  Administered 2018-09-19: 0.5 mL via INTRAMUSCULAR
  Filled 2018-09-18: qty 0.5

## 2018-09-18 MED ORDER — DIBUCAINE 1 % RE OINT: 1.0000 "application " | TOPICAL_OINTMENT | RECTAL | Status: DC | PRN

## 2018-09-18 MED ORDER — ONDANSETRON HCL 4 MG PO TABS: 4.0000 mg | ORAL_TABLET | ORAL | Status: DC | PRN

## 2018-09-18 MED ORDER — BENZOCAINE-MENTHOL 20-0.5 % EX AERO
1.0000 "application " | INHALATION_SPRAY | CUTANEOUS | Status: DC | PRN
  Administered 2018-09-18: 1 via TOPICAL
  Filled 2018-09-18: qty 56

## 2018-09-18 MED ORDER — WITCH HAZEL-GLYCERIN EX PADS: 1.0000 "application " | MEDICATED_PAD | CUTANEOUS | Status: DC | PRN

## 2018-09-18 NOTE — Progress Notes (Addendum)
Post Partum Day 1 Subjective: no complaints, up ad lib, voiding and tolerating PO   Objective: Vitals:   09/18/18 0000 09/18/18 0105 09/18/18 0541 09/18/18 0900  BP: 132/82 139/86 121/73 125/66  Pulse: 94 86 89 92  Resp: 16 16 16 16   Temp: 98.8 F (37.1 C) 98.2 F (36.8 C) 98.2 F (36.8 C) 97.7 F (36.5 C)  TempSrc: Oral Oral Oral Oral  SpO2: 99% 99% 98% 99%  Weight:      Height:        Physical Exam:  General: alert and cooperative Lochia: appropriate Uterine Fundus: firm Incision: n/a DVT Evaluation: No evidence of DVT seen on physical exam. Negative Homan's sign. No cords or calf tenderness. No significant calf/ankle edema.  Recent Labs    09/17/18 2242 09/18/18 0619  HGB 11.3* 10.8*  HCT 33.0* 31.0*    Assessment/Plan: Plan for discharge tomorrow, Breastfeeding and Circumcision prior to discharge. Chronic hypertension well controlled with Labetalol 100mg  BID, will continue to monitor    LOS: 1 day   Marikay Alar 09/18/2018, 9:32 AM   I saw and examined patient at bedside and agree with above findings, assessment and plan per CNM Ranee Gosselin.  I discussed with patient risks, benefits and alternatives of circumcision on her son including risks of bleeding, infection, damage to organs and need for further procedures.  All her questions were answered and she consented for the procedure to be done on her son. Dr. Alesia Richards.  09/18/2018 1041am.

## 2018-09-18 NOTE — Anesthesia Postprocedure Evaluation (Signed)
Anesthesia Post Note  Patient: Bailey Sheppard  Procedure(s) Performed: AN AD [place]     Patient location during evaluation: Mother Baby Anesthesia Type: Epidural Level of consciousness: awake Pain management: satisfactory to patient Vital Signs Assessment: post-procedure vital signs reviewed and stable Respiratory status: spontaneous breathing Cardiovascular status: stable Anesthetic complications: no    Last Vitals:  Vitals:   09/18/18 0541 09/18/18 0900  BP: 121/73 125/66  Pulse: 89 92  Resp: 16 16  Temp: 36.8 C 36.5 C  SpO2: 98% 99%    Last Pain:  Vitals:   09/18/18 0900  TempSrc: Oral  PainSc:    Pain Goal:                 [employer]

## 2018-09-18 NOTE — Progress Notes (Signed)
MOB was referred for history of depression/anxiety. * Referral screened out by Clinical Social Worker because none of the following criteria appear to apply: ~ History of anxiety/depression during this pregnancy, or of post-partum depression following prior delivery. ~ Diagnosis of anxiety and/or depression within last 3 years OR * MOB's symptoms currently being treated with medication and/or therapy.  MOB has a Rx for Zoloft.  Please contact the Clinical Social Worker if needs arise, by [facility] request, or if MOB scores greater than 9/yes to question 10 on Edinburgh Postpartum Depression Screen.  Laurey Arrow, MSW, LCSW Clinical Social Work [phone]

## 2018-09-18 NOTE — Lactation Note (Signed)
This note was copied from a baby's chart. Lactation Consultation Note  Patient Name: Bailey Sheppard [id number] Date: 09/18/2018 Reason for consult: Initial assessment;Term  P3 mother whose infant is now 35 hours old.  Mother breast fed her other 2 children (now 43 and 36 years old) for 3 months and 1 month respectively.  Baby sleeping in bassinet as I arrived and not showing feeding cues.  Encouraged to feed 8-12 times/24 hours or sooner if he shows feeding cues.  Reviewed feeding cues.  Demonstrated hand expression and mother did a return demonstration with a few drops of colostrum noted.  These drops were finger fed back to baby.  She will do hand expression before/after feedings to help increase milk supply.  Mother knows to call for latch assistance as needed due to the fact that she has not breast fed in 9 years.    Mom made aware of O/P services, breastfeeding support groups, community resources, and our phone # for post-discharge questions. Mother does not participate in [facility] and has a DEBP for home use.     Maternal Data Formula Feeding for Exclusion: No Has patient been taught Hand Expression?: Yes Does the patient have breastfeeding experience prior to this delivery?: Yes  Feeding    LATCH Score                   Interventions    Lactation Tools Discussed/Used WIC Program: No   Consult Status Consult Status: Follow-up Date: 09/19/18 Follow-up type: In-patient    Little Ishikawa 09/18/2018, 4:37 PM

## 2018-09-19 DIAGNOSIS — F32A Depression, unspecified: Secondary | ICD-10-CM

## 2018-09-19 DIAGNOSIS — F329 Major depressive disorder, single episode, unspecified: Secondary | ICD-10-CM

## 2018-09-19 MED ORDER — LABETALOL HCL 100 MG PO TABS: 100.0000 mg | ORAL_TABLET | Freq: Two times a day (BID) | ORAL | 1 refills | Status: DC

## 2018-09-19 MED ORDER — SERTRALINE HCL 50 MG PO TABS: 50.0000 mg | ORAL_TABLET | Freq: Every day | ORAL | 0 refills | Status: DC

## 2018-09-19 NOTE — Discharge Summary (Signed)
SVD OB Discharge Summary     Patient Name: Bailey Sheppard DOB: 09-Oct-1981 MRN: [id number]  Date of admission: 09/17/2018 Delivering MD: Starla Link  Date of delivery: 09/17/2018 Type of delivery: svd  Newborn Data: Sex: Baby Boy Circumcision: Already done Live born female  Birth Weight: 8 lb 8.2 oz (3861 g) APGAR: 9, 9  Newborn Delivery   Birth date/time:  09/17/2018 21:57:00 Delivery type:  Vaginal, Spontaneous     Feeding: breast Infant being discharge to home with mother in stable condition.   Admitting diagnosis: INDUCTION Intrauterine pregnancy: [redacted]w[redacted]d     Secondary diagnosis:  Active Problems:   Chronic hypertension during pregnancy   SVD (spontaneous vaginal delivery)   Normal postpartum course   Depression                                Complications: None                                                              Intrapartum Procedures: spontaneous vaginal delivery and GBS prophylaxis Postpartum Procedures: none Complications-Operative and Postpartum: 2nd degree perineal laceration Augmentation: AROM, Pitocin and Cytotec   History of Present Illness: [name] is a 37 y.o. female, [phone], who presents at [redacted]w[redacted]d weeks gestation. The patient has been followed at  [facility]  Her pregnancy has been complicated by:  Patient Active Problem List   Diagnosis Date Noted  . Normal postpartum course 09/19/2018  . Depression 09/19/2018  . Chronic hypertension during pregnancy 09/17/2018  . SVD (spontaneous vaginal delivery) 09/17/2018  . Morbid obesity with BMI of 40.0-44.9, adult ([place]) 12/03/2017  . Anxiety and depression 12/03/2017  . Hypertension 12/20/2016  . Hyponatremia 06/10/2013  . Hyperglycemia 06/10/2013  . Fibroid 05/13/2012  . 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 Iron deficiency anemia 02/20/2012    Hospital course:  Induction of Labor With Vaginal Delivery   37 y.o. yo  [phone] at [redacted]w[redacted]d was admitted to the hospital 09/17/2018 for induction of labor.  Indication for induction: CHTN controlled on labetalol .  Patient had an uncomplicated labor course as follows: Membrane Rupture Time/Date: 5:42 PM ,09/17/2018   Intrapartum Procedures: Episiotomy: None [1]                                         Lacerations:  2nd degree [3]  Patient had delivery of a Viable infant.  Information for the patient's newborn:  Audery, Wassena[place] [[id number]]  Delivery Method: Vag-Spont   09/17/2018  Details of delivery can be found in separate delivery note.  Patient had a routine postpartum course. Patient is discharged home 09/19/18. Postpartum Day # 2 : S/P NSVD due to IOL: CHTN controlled on meds. Patient up ad lib, denies syncope or dizziness. Reports consuming regular diet without issues and denies N/V. Patient reports 0 bowel movement + passing flatus.  Denies issues with urination and reports bleeding is "light."  Patient is Breastfeeding and reports going well.  Desires Mirena for postpartum contraception.  Pain is being appropriately managed with use of motrin. Pt has h/o ITP during  this pregnancy and is s/p IGG with hematology with good response. Pt taking Zoloft with no sing of depression, pt bonding well with infant and appears happy.   Physical exam  Vitals:   09/18/18 2152 09/19/18 0039 09/19/18 0530 09/19/18 1255  BP: 110/63 126/89 99/63 120/60  Pulse: 91 81 79   Resp: 16 16 16    Temp: 98 F (36.7 C)  98 F (36.7 C)   TempSrc: Oral  Oral   SpO2:   99%   Weight:      Height:       General: alert, cooperative and no distress Lochia: appropriate Uterine Fundus: firm Perineum: 2nd degree laceration approximate, no hematoma, no erythema.  DVT Evaluation: No evidence of DVT seen on physical exam. Negative Homan's sign. No cords or calf tenderness. +1 pitting edema in lower extremities bilaterally.   Labs: Lab Results  Component Value Date   WBC 9.9 09/18/2018    HGB 10.8 (L) 09/18/2018   HCT 31.0 (L) 09/18/2018   MCV 82.2 09/18/2018   PLT 125 (L) 09/18/2018   CMP Latest Ref Rng & Units 09/17/2018  Glucose 70 - 99 mg/dL 91  BUN 6 - 20 mg/dL 6  Creatinine 0.44 - 1.00 mg/dL 0.45  Sodium 135 - 145 mmol/L 136  Potassium 3.5 - 5.1 mmol/L 3.7  Chloride 98 - 111 mmol/L 107  CO2 22 - 32 mmol/L 21(L)  Calcium 8.9 - 10.3 mg/dL 8.9  Total Protein 6.5 - 8.1 g/dL 5.8(L)  Total Bilirubin 0.3 - 1.2 mg/dL 1.2  Alkaline Phos 38 - 126 U/L 118  AST 15 - 41 U/L 15  ALT 0 - 44 U/L 19    Date of discharge: 09/19/2018 Discharge Diagnoses: Term Pregnancy-delivered Discharge instruction: per After Visit Summary and "Baby and Me Booklet".  After visit meds:  Allergies as of 09/19/2018      Reactions   Nsaids    ITP      Medication List    TAKE these medications   acetaminophen 500 MG tablet Commonly known as:  TYLENOL Take 500 mg by mouth every 6 (six) hours as needed for headache.   labetalol 100 MG tablet Commonly known as:  NORMODYNE Take 1 tablet (100 mg total) by mouth 2 (two) times daily. Come in for blood pressure check 2 weeks after starting. What changed:  Another medication with the same name was added. Make sure you understand how and when to take each.   labetalol 100 MG tablet Commonly known as:  NORMODYNE Take 1 tablet (100 mg total) by mouth 2 (two) times daily. What changed:  You were already taking a medication with the same name, and this prescription was added. Make sure you understand how and when to take each.   PRENATAL VITAMIN PO Take by mouth.   sertraline 50 MG tablet Commonly known as:  ZOLOFT Take 1 tablet (50 mg total) by mouth daily. What changed:  Another medication with the same name was added. Make sure you understand how and when to take each.   sertraline 50 MG tablet Commonly known as:  ZOLOFT Take 1 tablet (50 mg total) by mouth at bedtime. What changed:  You were already taking a medication with the same  name, and this prescription was added. Make sure you understand how and when to take each.       Activity:           pelvic rest Advance as tolerated. Pelvic rest for 6 weeks.  Diet:  routine Medications: PNV and Ibuprofen Postpartum contraception: IUD Mirena wants at 6 week PPV Condition:  Pt discharge to home with baby in stable Depression: Continue zoloft, baby love nurse to visit at home 2 days and 1 week check up in office.  CHTN: Take labetalol 100mg  BID PO, baby to check bp at home and visit in one week.   Meds: Allergies as of 09/19/2018      Reactions   Nsaids    ITP      Medication List    TAKE these medications   acetaminophen 500 MG tablet Commonly known as:  TYLENOL Take 500 mg by mouth every 6 (six) hours as needed for headache.   labetalol 100 MG tablet Commonly known as:  NORMODYNE Take 1 tablet (100 mg total) by mouth 2 (two) times daily. Come in for blood pressure check 2 weeks after starting. What changed:  Another medication with the same name was added. Make sure you understand how and when to take each.   labetalol 100 MG tablet Commonly known as:  NORMODYNE Take 1 tablet (100 mg total) by mouth 2 (two) times daily. What changed:  You were already taking a medication with the same name, and this prescription was added. Make sure you understand how and when to take each.   PRENATAL VITAMIN PO Take by mouth.   sertraline 50 MG tablet Commonly known as:  ZOLOFT Take 1 tablet (50 mg total) by mouth daily. What changed:  Another medication with the same name was added. Make sure you understand how and when to take each.   sertraline 50 MG tablet Commonly known as:  ZOLOFT Take 1 tablet (50 mg total) by mouth at bedtime. What changed:  You were already taking a medication with the same name, and this prescription was added. Make sure you understand how and when to take each.       Discharge Follow Up:  Follow-up [facility] Follow up.   Specialty:  Obstetrics and Gynecology Why:  you are to call and make an appointment to see a provider in 1 week for PPV with BP check, depression check, and a 6 weeks PPV.  Contact information: [address] [place] [address] [place], NP-C, [place] 09/19/2018, 1:57 PM  Noralyn Pick, FNP

## 2018-09-19 NOTE — Lactation Note (Signed)
This note was copied from a baby's chart. Lactation Consultation Note  Patient Name: Bailey Sheppard [id number] Date: 09/19/2018 Reason for consult: Follow-up assessment;Term Mom reports that baby is latching easily.  He cluster fed during the night.  Instructed to continue feeding with cues.  Discussed milk coming to volume and the prevention and treatment of engorgement.  Mom has a breast pump at home.  Answered questions.  Lactation outpatient services and support reviewed and encouraged prn.  Maternal Data    Feeding    LATCH Score                   Interventions    Lactation Tools Discussed/Used     Consult Status Consult Status: Complete Follow-up type: Call as needed    Ave Filter 09/19/2018, 9:32 AM

## 2018-11-30 NOTE — Progress Notes (Signed)
[facility]  [phone] [phone]     ID: Bailey Sheppard   DOB: [birth year]  MR#: [id number]  KCL#:[id number]  Patient Care Team: Briscoe Deutscher, DO as PCP - General (Family Medicine) , Virgie Dad, MD as Attending Physician (Hematology and Oncology) Regina Eck, CNM as Referring Physician (Certified Nurse Midwife) Yisroel Ramming, Everardo All, MD as Consulting Physician (Obstetrics and Gynecology) Other:  CURRENT COMPLAINT: Immune thrombocytopenic purpura, iron deficiency anemia  CURRENT TREATMENT: Observation  HISTORY OF PRESENT ILLNESS: From the earlier summary note:  Bailey Sheppard is a  [place], [place] woman who moved from [place] with a documented history of ITP.  She was first diagnosed with ITP by a hematologist in [place] in 2007.  At that time she had a history of cytopenia with associated heavy menstrual bleeding.    She presented to our ED in 02/2012 with an unusual headache and bruises on different areas of her body.  She was seen by Dr. 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 The patient stated while living in [place], her blood counts had been up and down but she only received one course of high-dose prednisone in the prior 6 years. When she had been treated with oral steroids, she responded to the treatment.  Dr. Truddie Coco also noted absent iron stores. Her iron deficiency had been treated with IV iron multiple times.  She moved back to [place] in 2010,  but had not sought medical attention before presenting to the ED in 02/2012.   Dr. Truddie Coco treated Bailey Sheppard with Prednisone  for relapsed ITP.  She had been off steroids since 04/2012 and was being observed. Her platelet counts had normalized. She has a history of having  heavy menses but her menses have been scant if any since having an IUD placed in 04/2012.  Bailey Sheppard called the  office of the [facility] on 06/08/2013 reporting new areas of bruising in all extremities, especially in thighs, gum bleeding when brushing her teeth, and blood clots from the left nostril. CBC laboratory results obtained at the [facility] revealed a platelet count of 5000.  Further evaluation at the ED was recommended.  Bailey Sheppard was admitted to the hospital from 06/08/2013 - 06/12/2013 and was diagnosed with relapsed ITP and acute pericarditis.  While hospitalized, the patient received intravenous Dexamethasone and IVIG.  Her platelet counts had normalized to 259,000 following hospitalization.  I last saw the patient on 09/13/2014, with platelet count at that time in the normal range (215 K).   INTERVAL HISTORY:  Bailey Sheppard returns today for follow-up of her immune thrombocytopenia and anemia.   She had her final IVIG dose on 08/27/2018. She has been off treatment now for several months  She also has a history of iron deficiency anemia.  Received Feraheme 07/24/2018.   Since her last visit here she delivered an 8 pound 8.2 ounce baby boy with an Apgar of 9 on 09/17/2018.   REVIEW OF SYSTEMS: Bailey Sheppard is doing well overall. Her birth went well; this is her third Child, [name]. She has a Mirena in place and is experiencing long periods that can last over a week, but they are not as heavy as they have been in the past. She is nursing. She went back to work on Friday (11/28/2018). The patient denies unusual headaches, visual changes, nausea, vomiting, or dizziness. There has been no unusual cough, phlegm  production, or pleurisy. This been no change in bowel or bladder habits. The patient denies unexplained fatigue or unexplained weight loss, bleeding, rash, or fever. A detailed review of systems was otherwise noncontributory.    PAST MEDICAL HISTORY: Past Medical History:  Diagnosis Date  . Acute pericarditis, unspecified 06/11/2013  . AMA (advanced maternal age) multigravida  22+   . Anemia   . Anxiety   . Clotting disorder ([place])   . Depression   . Elevated hemoglobin A1c 2018   5.7  . Fibroid   . Fibroids   . Hypertension   . ITP (idiopathic thrombocytopenic purpura)   . Obesity   . Vaginal Pap smear, abnormal     PAST SURGICAL HISTORY: Past Surgical History:  Procedure Laterality Date  . INTRAUTERINE DEVICE (IUD) INSERTION  04/2012  . UMBILICAL HERNIA REPAIR      FAMILY HISTORY Family History  Problem Relation Age of Onset  . Hypertension Mother   . Diabetes Father   . Kidney failure Father   . Heart failure Father   . Lupus Cousin   . Diabetes Maternal Grandfather   . Kidney failure Maternal Grandfather   . Diabetes Paternal Grandmother     GYNECOLOGIC HISTORY:   Last menstrual period 12/18/2017.  Positive pregnancy test 01/21/2018  G3 P3, Menarche age 29. Age of first live birth: 89.    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 At home is her fiance Ronnie Doss, who is a Software engineer at a private school. They have 3 sons together, aged 68, 78, and 2 months (born 09/17/2018).     ADVANCED DIRECTIVES:  Not on file  HEALTH MAINTENANCE: Social History   Tobacco Use  . Smoking status: Never Smoker  . Smokeless tobacco: Never Used  Substance Use Topics  . Alcohol use: No    Frequency: Never  . Drug use: No     Allergies  Allergen Reactions  . Nsaids     ITP    Current Outpatient Medications  Medication Sig Dispense Refill  . acetaminophen (TYLENOL) 500 MG tablet Take 500 mg by mouth every 6 (six) hours as needed for headache.    . labetalol (NORMODYNE) 100 MG tablet Take 1 tablet (100 mg total) by mouth 2 (two) times daily. Come in for blood pressure check 2 weeks after starting. 60 tablet 2  . labetalol (NORMODYNE) 100 MG tablet Take 1 tablet (100 mg total) by mouth 2 (two) times daily. 120 tablet 1  . Prenatal Vit-Fe Fumarate-FA (PRENATAL VITAMIN PO)  Take by mouth.    . sertraline (ZOLOFT) 50 MG tablet Take 1 tablet (50 mg total) by mouth daily. 90 tablet 3  . sertraline (ZOLOFT) 50 MG tablet Take 1 tablet (50 mg total) by mouth at bedtime. 30 tablet 0   No current facility-administered medications for this visit.     OBJECTIVE: Young African American woman who appears well Vitals:   12/02/18 1157  BP: (!) 143/103  Pulse: 75  Resp: 18  Temp: (!) 97.5 F (36.4 C)  SpO2: 100%     Body mass index is 40.12 kg/m.     Filed Weights   12/02/18 1157  Weight: 279 lb 9.6 oz (126.8 kg)   repeat BP 136/92  ECOG FS: 0 - Asymptomatic   Sclerae unicteric, EOMs intact No cervical or supraclavicular adenopathy Lungs no rales or rhonchi Heart regular rate and rhythm Abd soft, nontender, positive bowel sounds MSK no  focal spinal tenderness, no upper extremity lymphedema Neuro: nonfocal, well oriented, appropriate affect Skin: I do not see any evidence of hidradenitis in the right axilla  LAB RESULTS: Lab Results  Component Value Date   WBC 5.4 12/02/2018   NEUTROABS 3.4 12/02/2018   HGB 12.9 12/02/2018   HCT 38.3 12/02/2018   MCV 81.8 12/02/2018   PLT 160 12/02/2018      Chemistry      Component Value Date/Time   NA 136 09/17/2018 0940   NA 140 09/13/2014 1400   K 3.7 09/17/2018 0940   K 3.7 09/13/2014 1400   CL 107 09/17/2018 0940   CL 105 06/19/2013 0845   CO2 21 (L) 09/17/2018 0940   CO2 24 09/13/2014 1400   BUN 6 09/17/2018 0940   BUN 9.7 09/13/2014 1400   CREATININE 0.45 09/17/2018 0940   CREATININE 0.61 09/03/2018 1128   CREATININE 0.8 09/13/2014 1400      Component Value Date/Time   CALCIUM 8.9 09/17/2018 0940   CALCIUM 9.1 09/13/2014 1400   ALKPHOS 118 09/17/2018 0940   ALKPHOS 53 09/13/2014 1400   AST 15 09/17/2018 0940   AST 15 09/03/2018 1128   AST 14 09/13/2014 1400   ALT 19 09/17/2018 0940   ALT 29 09/03/2018 1128   ALT 13 09/13/2014 1400   BILITOT 1.2 09/17/2018 0940   BILITOT 0.8 09/03/2018  1128   BILITOT 0.95 09/13/2014 1400      STUDIES: No results found.   ASSESSMENT: 37 y.o. [place], [place] woman:  1.  ITP, requiring hospitalization in June 2014. Patient was treated with IV dexamethasone and IVIG. She was then on a dexamethasone taper between 06/22/2013 and 07/12/2013. She received 6 weekly doses of rituximab, followed by 2 q. three-week doses, with the final dose given on 08/21/2013.  2. History of iron deficiency anemia,  (a) IV Feraheme was given in March 2013.  (b) received repeat IV Feraheme on 07/17/2018 and 07/24/2018 for a ferritin less than 6  3. Acute pericarditis - resolved   4.  Pregnancy complicated by iron deficiency and thrombocytopenia: Resolved   PLAN: Bailey Sheppard did very well with the September pregnancy.  She is nursing which in itself should help as far as her periods are concerned.  Of course she also has a Mirena in place.  Currently her platelets are normal and she has no anemia.  She will be seeing her primary care physician twice a year, she tells me.  If her counts are checked at those visits then assuming there is further problem with anemia she can be referred back.  Otherwise I am not making a return appointment for her here.  Of course I will be glad to see her again at any point in the future if and when the need arises.  , Virgie Dad, MD  12/02/18 12:13 PM Medical Oncology and Hematology [facility] [address] [place], [place] [address] Tel. [phone]    Fax. [phone]   I, Jacqualyn Posey am acting as a Education administrator for Chauncey Cruel, MD.   I, Lurline Del MD, have reviewed the above documentation for accuracy and completeness, and I agree with the above.

## 2018-12-01 ENCOUNTER — Other Ambulatory Visit: Payer: Self-pay | Admitting: *Deleted

## 2018-12-01 DIAGNOSIS — D508 Other iron deficiency anemias: Secondary | ICD-10-CM

## 2018-12-01 DIAGNOSIS — D693 Immune thrombocytopenic purpura: Secondary | ICD-10-CM

## 2018-12-02 ENCOUNTER — Inpatient Hospital Stay: Payer: Managed Care, Other (non HMO) | Attending: Oncology

## 2018-12-02 ENCOUNTER — Inpatient Hospital Stay (HOSPITAL_BASED_OUTPATIENT_CLINIC_OR_DEPARTMENT_OTHER): Payer: Managed Care, Other (non HMO) | Admitting: Oncology

## 2018-12-02 VITALS — BP 143/103 | HR 75 | Temp 97.5°F | Resp 18 | Ht 70.0 in | Wt 279.6 lb

## 2018-12-02 DIAGNOSIS — N920 Excessive and frequent menstruation with regular cycle: Secondary | ICD-10-CM | POA: Insufficient documentation

## 2018-12-02 DIAGNOSIS — Z79899 Other long term (current) drug therapy: Secondary | ICD-10-CM | POA: Diagnosis not present

## 2018-12-02 DIAGNOSIS — D509 Iron deficiency anemia, unspecified: Secondary | ICD-10-CM

## 2018-12-02 DIAGNOSIS — D693 Immune thrombocytopenic purpura: Secondary | ICD-10-CM

## 2018-12-02 DIAGNOSIS — D508 Other iron deficiency anemias: Secondary | ICD-10-CM

## 2018-12-02 DIAGNOSIS — I10 Essential (primary) hypertension: Secondary | ICD-10-CM | POA: Diagnosis not present

## 2018-12-02 DIAGNOSIS — Z975 Presence of (intrauterine) contraceptive device: Secondary | ICD-10-CM | POA: Diagnosis not present

## 2018-12-02 DIAGNOSIS — D500 Iron deficiency anemia secondary to blood loss (chronic): Secondary | ICD-10-CM

## 2018-12-02 LAB — CMP (CANCER CENTER ONLY)
ALK PHOS: 104 U/L (ref 38–126)
ALT: 20 U/L (ref 0–44)
AST: 13 U/L — ABNORMAL LOW (ref 15–41)
Albumin: 4 g/dL (ref 3.5–5.0)
Anion gap: 8 (ref 5–15)
BILIRUBIN TOTAL: 1.3 mg/dL — AB (ref 0.3–1.2)
BUN: 11 mg/dL (ref 6–20)
CALCIUM: 9.1 mg/dL (ref 8.9–10.3)
CO2: 26 mmol/L (ref 22–32)
CREATININE: 0.77 mg/dL (ref 0.44–1.00)
Chloride: 108 mmol/L (ref 98–111)
GFR, Est AFR Am: >60 mL/min (ref >60)
Glucose, Bld: 103 mg/dL — ABNORMAL HIGH (ref 70–99)
Potassium: 3.4 mmol/L — ABNORMAL LOW (ref 3.5–5.1)
Sodium: 142 mmol/L (ref 135–145)
TOTAL PROTEIN: 6.6 g/dL (ref 6.5–8.1)

## 2018-12-02 LAB — CBC WITH DIFFERENTIAL (CANCER CENTER ONLY)
ABS IMMATURE GRANULOCYTES: 0.01 10*3/uL (ref 0.00–0.07)
BASOS PCT: 1 %
Basophils Absolute: 0 10*3/uL (ref 0.0–0.1)
Eosinophils Absolute: 0.3 10*3/uL (ref 0.0–0.5)
Eosinophils Relative: 5 %
HEMATOCRIT: 38.3 % (ref 36.0–46.0)
Hemoglobin: 12.9 g/dL (ref 12.0–15.0)
IMMATURE GRANULOCYTES: 0 %
LYMPHS ABS: 1.3 10*3/uL (ref 0.7–4.0)
LYMPHS PCT: 24 %
MCH: 27.6 pg (ref 26.0–34.0)
MCHC: 33.7 g/dL (ref 30.0–36.0)
MCV: 81.8 fL (ref 80.0–100.0)
MONO ABS: 0.4 10*3/uL (ref 0.1–1.0)
Monocytes Relative: 7 %
NEUTROS ABS: 3.4 10*3/uL (ref 1.7–7.7)
NEUTROS PCT: 63 %
NRBC: 0 % (ref 0.0–0.2)
PLATELETS: 160 10*3/uL (ref 150–400)
RBC: 4.68 MIL/uL (ref 3.87–5.11)
RDW: 15.3 % (ref 11.5–15.5)
WBC Count: 5.4 10*3/uL (ref 4.0–10.5)

## 2018-12-02 LAB — FERRITIN: FERRITIN: 29 ng/mL (ref 11–307)

## 2019-02-13 ENCOUNTER — Telehealth: Payer: Self-pay | Admitting: Family Medicine

## 2019-02-13 ENCOUNTER — Ambulatory Visit (INDEPENDENT_AMBULATORY_CARE_PROVIDER_SITE_OTHER): Payer: BLUE CROSS/BLUE SHIELD | Admitting: Family Medicine

## 2019-02-13 ENCOUNTER — Encounter: Payer: Self-pay | Admitting: Family Medicine

## 2019-02-13 VITALS — BP 132/94 | HR 83 | Temp 98.3°F | Ht 70.0 in | Wt 284.4 lb

## 2019-02-13 DIAGNOSIS — J329 Chronic sinusitis, unspecified: Secondary | ICD-10-CM | POA: Diagnosis not present

## 2019-02-13 DIAGNOSIS — J029 Acute pharyngitis, unspecified: Secondary | ICD-10-CM

## 2019-02-13 LAB — POCT RAPID STREP A (OFFICE): RAPID STREP A SCREEN: NEGATIVE

## 2019-02-13 MED ORDER — IPRATROPIUM BROMIDE 0.06 % NA SOLN: 2.0000 | Freq: Four times a day (QID) | NASAL | 0 refills | Status: DC

## 2019-02-13 MED ORDER — AMOXICILLIN-POT CLAVULANATE 875-125 MG PO TABS: 1.0000 | ORAL_TABLET | Freq: Two times a day (BID) | ORAL | 0 refills | Status: DC

## 2019-02-13 NOTE — Progress Notes (Signed)
   Chief Complaint:  Bailey Sheppard is a 38 y.o. female who presents for same day appointment with a chief complaint of sore throat.   Assessment/Plan:  Sinusitis/sore throat Symptoms have been present for greater than a week and are worsening.  Start Augmentin and Atrovent.  Encouraged good oral hydration.  Recommended Tylenol as needed.  Discussed reasons to return to care.  Follow-up as needed.     Subjective:  HPI:  Sore Throat, acute problem Symptoms started about a week ago. Symptoms are worsening. Associated with headache, nasal drainage,sputum production, and chest congestion. No treatments tried. Several other family members have been sick with similar symptoms. No fevers. Some chills. No other obvious alleviating or aggravating factors.   ROS: Per HPI  PMH: She reports that she has never smoked. She has never used smokeless tobacco. She reports that she does not drink alcohol or use drugs.      Objective:  Physical Exam: BP (!) 132/94 (BP Location: Left Arm, Patient Position: Sitting, Cuff Size: Large)   Pulse 83   Temp 98.3 F (36.8 C) (Oral)   Ht 5\' 10"  (1.778 m)   Wt 284 lb 6.1 oz (129 kg)   SpO2 97%   BMI 40.80 kg/m   Gen: NAD, resting comfortably HEENT: TMs with clear effusion.  OP erythematous.  Nasal mucosa erythematous and boggy with clear discharge. CV: Regular rate and rhythm with no murmurs appreciated Pulm: Normal work of breathing, clear to auscultation bilaterally with no crackles, wheezes, or rhonchi     Shabrea Weldin M. Jerline Pain, MD 02/13/2019 12:03 PM

## 2019-02-13 NOTE — Telephone Encounter (Signed)
See note

## 2019-02-13 NOTE — Telephone Encounter (Signed)
Called [facility] pharmacy and spoke to [facility] told her to cancel Rx's Augmentin and Atrovent sent to wrong pharmacy. Davy Pique said they were already transferred to [place] they called for transfer. Told her okay thanks.  [facility] pharmacy and spoke to [place] told her calling about Rx's for pt to cancel them cause I did not realize that you had them transferred. Jarrett Soho verbalized understanding and said she will cancel duplicate Rx's.

## 2019-02-13 NOTE — Patient Instructions (Signed)
Start the atrovent and augmentin  Please stay well hydrated.  You can take tylenol as needed for low grade fever and pain.  Please let me know if your symptoms worsen or fail to improve.  Take care, Dr Jerline Pain

## 2019-02-13 NOTE — Telephone Encounter (Signed)
Copied from [place] [id number]. Topic: Quick Communication - See Telephone Encounter >> Feb 13, 2019  2:36 PM Conception Chancy, NT wrote: CRM for notification. See Telephone encounter for: 02/13/19.  Patient is calling and is needing her medications changed to a different pharmacy .  amoxicillin-clavulanate (AUGMENTIN) 875-125 MG tablet ipratropium (ATROVENT) 0.06 % nasal spray  Fredric Dine, [place] - [place], [place] - [address] [place] [address] Phone: [phone] Fax: [phone]

## 2019-03-05 ENCOUNTER — Telehealth: Payer: Self-pay | Admitting: Family Medicine

## 2019-03-05 NOTE — Telephone Encounter (Signed)
Please advise 

## 2019-03-05 NOTE — Telephone Encounter (Signed)
See note

## 2019-03-05 NOTE — Telephone Encounter (Signed)
Copied from [place] [id number]. Topic: Quick Communication - See Telephone Encounter >> Mar 05, 2019 12:38 PM Bea Graff, NT wrote: CRM for notification. See Telephone encounter for: 03/05/19. Pt called in with the complaint that she believes her BP is high but has no way to check it at this time. Pt states that she has been missing her bp medication doses and now is "not feeling well" and has pressure behind her eyes and eyes are red. Spoke with triage nurse Larene Beach who stated that I could make the pt an appt for today, that the pt does not need to be triaged due to not knowing what her BP is reading. And to advise pt to go to the ER to have it checked if she wishes. No appts available at the office for today. Offered pt an appt for tomorrow or to go to urgent care for immediate help. Pt stated that she will go to the ER or urgent care today.

## 2019-03-06 ENCOUNTER — Encounter: Payer: Self-pay | Admitting: Family Medicine

## 2019-03-06 ENCOUNTER — Ambulatory Visit (INDEPENDENT_AMBULATORY_CARE_PROVIDER_SITE_OTHER): Payer: BLUE CROSS/BLUE SHIELD | Admitting: Family Medicine

## 2019-03-06 VITALS — BP 158/96 | HR 75 | Temp 97.9°F | Ht 70.0 in | Wt 291.4 lb

## 2019-03-06 DIAGNOSIS — E7800 Pure hypercholesterolemia, unspecified: Secondary | ICD-10-CM | POA: Diagnosis not present

## 2019-03-06 DIAGNOSIS — I10 Essential (primary) hypertension: Secondary | ICD-10-CM

## 2019-03-06 DIAGNOSIS — D693 Immune thrombocytopenic purpura: Secondary | ICD-10-CM

## 2019-03-06 DIAGNOSIS — Z6841 Body Mass Index (BMI) 40.0 and over, adult: Secondary | ICD-10-CM

## 2019-03-06 MED ORDER — LISINOPRIL-HYDROCHLOROTHIAZIDE 10-12.5 MG PO TABS: 1.0000 | ORAL_TABLET | Freq: Every day | ORAL | 3 refills | Status: DC

## 2019-03-06 MED ORDER — HYDROCHLOROTHIAZIDE 25 MG PO TABS: 25.0000 mg | ORAL_TABLET | Freq: Every day | ORAL | 0 refills | Status: DC

## 2019-03-07 LAB — COMPREHENSIVE METABOLIC PANEL
AG Ratio: 2.2 (calc) (ref 1.0–2.5)
ALT: 19 U/L (ref 6–29)
AST: 16 U/L (ref 10–30)
Albumin: 4.3 g/dL (ref 3.6–5.1)
Alkaline phosphatase (APISO): 88 U/L (ref 31–125)
BUN: 9 mg/dL (ref 7–25)
CO2: 26 mmol/L (ref 20–32)
Calcium: 9.3 mg/dL (ref 8.6–10.2)
Chloride: 104 mmol/L (ref 98–110)
Creat: 0.76 mg/dL (ref 0.50–1.10)
Globulin: 2 g/dL (calc) (ref 1.9–3.7)
Glucose, Bld: 84 mg/dL (ref 65–99)
Potassium: 3.5 mmol/L (ref 3.5–5.3)
Sodium: 141 mmol/L (ref 135–146)
Total Bilirubin: 1.2 mg/dL (ref 0.2–1.2)
Total Protein: 6.3 g/dL (ref 6.1–8.1)

## 2019-03-07 LAB — LIPID PANEL
Cholesterol: 185 mg/dL (ref <200)
HDL: 42 mg/dL — ABNORMAL LOW (ref >=50)
LDL Cholesterol (Calc): 125 mg/dL (calc) — ABNORMAL HIGH
Non-HDL Cholesterol (Calc): 143 mg/dL (calc) — ABNORMAL HIGH (ref <130)
Total CHOL/HDL Ratio: 4.4 (calc) (ref <5.0)
Triglycerides: 81 mg/dL (ref <150)

## 2019-03-07 LAB — CBC WITH DIFFERENTIAL/PLATELET
Absolute Monocytes: 447 cells/uL (ref 200–950)
Basophils Absolute: 21 cells/uL (ref 0–200)
Basophils Relative: 0.4 %
Eosinophils Absolute: 260 cells/uL (ref 15–500)
Eosinophils Relative: 5 %
HCT: 38.2 % (ref 35.0–45.0)
Hemoglobin: 13.1 g/dL (ref 11.7–15.5)
Lymphs Abs: 1030 cells/uL (ref 850–3900)
MCH: 27.9 pg (ref 27.0–33.0)
MCHC: 34.3 g/dL (ref 32.0–36.0)
MCV: 81.3 fL (ref 80.0–100.0)
MPV: 13 fL — ABNORMAL HIGH (ref 7.5–12.5)
Monocytes Relative: 8.6 %
Neutro Abs: 3442 cells/uL (ref 1500–7800)
Neutrophils Relative %: 66.2 %
Platelets: 74 10*3/uL — ABNORMAL LOW (ref 140–400)
RBC: 4.7 10*6/uL (ref 3.80–5.10)
RDW: 13.9 % (ref 11.0–15.0)
Total Lymphocyte: 19.8 %
WBC: 5.2 10*3/uL (ref 3.8–10.8)

## 2019-03-07 LAB — TSH: TSH: 1.71 mIU/L

## 2019-03-08 ENCOUNTER — Encounter: Payer: Self-pay | Admitting: Family Medicine

## 2019-03-08 DIAGNOSIS — E7800 Pure hypercholesterolemia, unspecified: Secondary | ICD-10-CM | POA: Insufficient documentation

## 2019-03-08 NOTE — Assessment & Plan Note (Signed)
The patient is asked to make an attempt to improve diet and exercise patterns to aid in medical management of this problem.  

## 2019-03-08 NOTE — Assessment & Plan Note (Signed)
CBC Latest Ref Rng & Units 03/06/2019 12/02/2018 09/18/2018  WBC 3.8 - 10.8 Thousand/uL 5.2 5.4 9.9  Hemoglobin 11.7 - 15.5 g/dL 13.1 12.9 10.8(L)  Hematocrit 35.0 - 45.0 % 38.2 38.3 31.0(L)  Platelets 140 - 400 Thousand/uL 74(L) 160 125(L)

## 2019-03-08 NOTE — Assessment & Plan Note (Signed)
She will start HCTZ this week. When stops breastfeeding, will transition to [place]. Discussed low salt diet, stress reduction. If not improving, will get sleep study.

## 2019-03-08 NOTE — Progress Notes (Signed)
Bailey Sheppard is a 38 y.o. female is here for follow up.  Assessment and Plan:   Pure hypercholesterolemia The patient is asked to make an attempt to improve diet and exercise patterns to aid in medical management of this problem.   Morbid obesity with BMI of 40.0-44.9, adult [facility]) The patient is asked to make an attempt to improve diet and exercise patterns to aid in medical management of this problem.   Idiopathic thrombocytopenic purpura (HCC) CBC Latest Ref Rng & Units 03/06/2019 12/02/2018 09/18/2018  WBC 3.8 - 10.8 Thousand/uL 5.2 5.4 9.9  Hemoglobin 11.7 - 15.5 g/dL 13.1 12.9 10.8(L)  Hematocrit 35.0 - 45.0 % 38.2 38.3 31.0(L)  Platelets 140 - 400 Thousand/uL 74(L) 160 125(L)     Hypertension She will start HCTZ this week. When stops breastfeeding, will transition to [place]. Discussed low salt diet, stress reduction. If not improving, will get sleep study.   Orders Placed This Encounter  Procedures  . TSH  . Lipid panel  . Comprehensive metabolic panel  . CBC with Differential/Platelet   Meds ordered this encounter  Medications  . lisinopril-hydrochlorothiazide (PRINZIDE,ZESTORETIC) 10-12.5 MG tablet    Sig: Take 1 tablet by mouth daily.    Dispense:  90 tablet    Refill:  3  . hydrochlorothiazide (HYDRODIURIL) 25 MG tablet    Sig: Take 1 tablet (25 mg total) by mouth daily.    Dispense:  20 tablet    Refill:  0    Subjective:   Hypertension  This is a chronic problem. The problem has been gradually worsening since onset. The problem is uncontrolled. Associated symptoms include headaches. Pertinent negatives include no blurred vision, chest pain, malaise/fatigue, palpitations, peripheral edema or shortness of breath. There are no associated agents to hypertension. Risk factors for coronary artery disease include dyslipidemia, obesity and stress. Past treatments include beta blockers. Compliance problems include diet and exercise.  There is no history of  kidney disease, heart failure or retinopathy.   Post partum. On HCTZ prior to pregnancy, on Labetolol during pregnancy. Stopped all medications after delivery. Weaning from breastfeeding this week. Noted headache, BP elevated. No red flags. Compliance is an issue. Mirena in place.  Significant Labs:   Results for orders placed or performed in visit on 03/06/19  CBC with Differential/Platelet  Result Value Ref Range   WBC 5.2 3.8 - 10.8 Thousand/uL   RBC 4.70 3.80 - 5.10 Million/uL   Hemoglobin 13.1 11.7 - 15.5 g/dL   HCT 38.2 35.0 - 45.0 %   MCV 81.3 80.0 - 100.0 fL   MCH 27.9 27.0 - 33.0 pg   MCHC 34.3 32.0 - 36.0 g/dL   RDW 13.9 11.0 - 15.0 %   Platelets 74 (L) 140 - 400 Thousand/uL   MPV 13.0 (H) 7.5 - 12.5 fL   Neutro Abs 3,442 1,500 - 7,800 cells/uL   Lymphs Abs 1,030 850 - 3,900 cells/uL   Absolute Monocytes 447 200 - 950 cells/uL   Eosinophils Absolute 260 15 - 500 cells/uL   Basophils Absolute 21 0 - 200 cells/uL   Neutrophils Relative % 66.2 %   Total Lymphocyte 19.8 %   Monocytes Relative 8.6 %   Eosinophils Relative 5.0 %   Basophils Relative 0.4 %   Smear Review    Comprehensive metabolic panel  Result Value Ref Range   Glucose, Bld 84 65 - 99 mg/dL   BUN 9 7 - 25 mg/dL   Creat 0.76 0.50 - 1.10 mg/dL  BUN/Creatinine Ratio NOT APPLICABLE 6 - 22 (calc)   Sodium 141 135 - 146 mmol/L   Potassium 3.5 3.5 - 5.3 mmol/L   Chloride 104 98 - 110 mmol/L   CO2 26 20 - 32 mmol/L   Calcium 9.3 8.6 - 10.2 mg/dL   Total Protein 6.3 6.1 - 8.1 g/dL   Albumin 4.3 3.6 - 5.1 g/dL   Globulin 2.0 1.9 - 3.7 g/dL (calc)   AG Ratio 2.2 1.0 - 2.5 (calc)   Total Bilirubin 1.2 0.2 - 1.2 mg/dL   Alkaline phosphatase (APISO) 88 31 - 125 U/L   AST 16 10 - 30 U/L   ALT 19 6 - 29 U/L  Lipid panel  Result Value Ref Range   Cholesterol 185 <200 mg/dL   HDL 42 (L) > OR = 50 mg/dL   Triglycerides 81 <150 mg/dL   LDL Cholesterol (Calc) 125 (H) mg/dL (calc)   Total CHOL/HDL Ratio 4.4 <5.0  (calc)   Non-HDL Cholesterol (Calc) 143 (H) <130 mg/dL (calc)  TSH  Result Value Ref Range   TSH 1.71 mIU/L   Health Maintenance:  There are no preventive care reminders to display for this patient. Depression screen [facility] 2/9 10/07/2017 07/29/2017  Decreased Interest 0 0  Down, Depressed, Hopeless 0 0  PHQ - 2 Score 0 0   PMHx, SurgHx, SocialHx, FamHx, Medications, and Allergies were reviewed in the Visit Navigator and updated as appropriate.   Patient Active Problem List   Diagnosis Date Noted  . Pure hypercholesterolemia 03/08/2019  . Normal postpartum course 09/19/2018  . Depression 09/19/2018  . Chronic hypertension during pregnancy 09/17/2018  . SVD (spontaneous vaginal delivery) 09/17/2018  . Morbid obesity with BMI of 40.0-44.9, adult ([place]) 12/03/2017  . Anxiety and depression 12/03/2017  . Hypertension 12/20/2016  . Hyponatremia 06/10/2013  . Hyperglycemia 06/10/2013  . Fibroid 05/13/2012  . Idiopathic thrombocytopenic purpura ([place]) 02/20/2012  . Iron deficiency anemia 02/20/2012   Social History   Tobacco Use  . Smoking status: Never Smoker  . Smokeless tobacco: Never Used  Substance Use Topics  . Alcohol use: No    Frequency: Never  . Drug use: No   Current Medications and Allergies:   .  None   Allergies  Allergen Reactions  . Nsaids     ITP   Review of Systems   Pertinent items are noted in the HPI. Otherwise, ROS is negative.  Vitals:   Vitals:   03/06/19 1414  BP: (!) 158/96  Pulse: 75  Temp: 97.9 F (36.6 C)  TempSrc: Oral  SpO2: 97%  Weight: 291 lb 6.1 oz (132.2 kg)  Height: 5\' 10"  (1.778 m)     Body mass index is 41.81 kg/m. Physical Exam:   General: Cooperative, alert and oriented, well developed, well nourished, in no acute distress. HEENT: Pupils equal round reactive light and extraocular movements intact. Conjunctivae and lids unremarkable. No pallor or cyanosis, dentition good. Neck: No thyromegaly.  Cardiovascular: Regular  rhythm. No murmurs appreciated.  Lungs: Normal work of breathing. Clear bilaterally without rales, rhonchi, or wheezing.  Abdomen: Soft, nontender, no masses. Normal bowel sounds. Extremities: No clubbing, cyanosis, erythema. No edema.  Skin: Warm and dry. Neurologic: No focal deficits.  Psychiatric: Normal affect and thought content.   . Reviewed expectations re: course of current medical issues. . Discussed self-management of symptoms. . Outlined signs and symptoms indicating need for more acute intervention. . Patient verbalized understanding and all questions were answered. Marland Kitchen Health Maintenance issues  including appropriate healthy diet, exercise, and smoking avoidance were discussed with patient. . See orders for this visit as documented in the electronic medical record. . Patient received an After Visit Summary.  Briscoe Deutscher, DO [place] 03/08/2019

## 2019-03-13 DIAGNOSIS — H15101 Unspecified episcleritis, right eye: Secondary | ICD-10-CM | POA: Diagnosis not present

## 2019-03-13 DIAGNOSIS — H1789 Other corneal scars and opacities: Secondary | ICD-10-CM | POA: Diagnosis not present

## 2019-07-10 ENCOUNTER — Encounter: Payer: Self-pay | Admitting: Physician Assistant

## 2019-07-10 ENCOUNTER — Ambulatory Visit (INDEPENDENT_AMBULATORY_CARE_PROVIDER_SITE_OTHER): Payer: BC Managed Care – PPO | Admitting: Physician Assistant

## 2019-07-10 DIAGNOSIS — R1084 Generalized abdominal pain: Secondary | ICD-10-CM

## 2019-07-10 NOTE — Progress Notes (Signed)
Virtual Visit via Video   I connected with Bailey Sheppard on 07/10/19 at  4:00 PM EDT by a video enabled telemedicine application and verified that I am speaking with the correct person using two identifiers. Location patient: Home Location provider: [facility], Office Persons participating in the virtual visit: Bailey Sheppard, [place] PA-C   I discussed the limitations of evaluation and management by telemedicine and the availability of in person appointments. The patient expressed understanding and agreed to proceed.  Subjective:   HPI:   IBS Has constant pain, bloating, gas and more mucous-y bowels. Often tries to go to the bathroom and isn't able to have a bowel movement. Has not tried anything for her symptoms. Has a history of constipation, has used colace in the past.  Denies: bloody stool, n/v, chance of pregnancy, unusual weight changes, urinary symptoms  Symptoms have been going on 6 months to a year.  Dietary recall, currently doing keto for weight loss: Breakfast: coffee, apple, almonds Lunch: salad with salmon OR keto pizza Dinner: fish with veggies  Water intake: 60 oz water a day  Has concern for gluten and lactose-intolerance. Doesn't eat any gluten presently. Dad with confirmed gluten allergy.  Patient's last menstrual period was 06/10/2019.   ROS: See pertinent positives and negatives per HPI.  Patient Active Problem List   Diagnosis Date Noted  . Pure hypercholesterolemia 03/08/2019  . Normal postpartum course 09/19/2018  . Depression 09/19/2018  . Chronic hypertension during pregnancy 09/17/2018  . SVD (spontaneous vaginal delivery) 09/17/2018  . Morbid obesity with BMI of 40.0-44.9, adult ([place]) 12/03/2017  . Anxiety and depression 12/03/2017  . Hypertension 12/20/2016  . Hyponatremia 06/10/2013  . Hyperglycemia 06/10/2013  . Fibroid 05/13/2012  . Idiopathic thrombocytopenic purpura ([place]) 02/20/2012  . Iron deficiency anemia  02/20/2012    Social History   Tobacco Use  . Smoking status: Never Smoker  . Smokeless tobacco: Never Used  Substance Use Topics  . Alcohol use: No    Frequency: Never    Current Outpatient Medications:  .  acetaminophen (TYLENOL) 500 MG tablet, Take 500 mg by mouth every 6 (six) hours as needed for headache., Disp: , Rfl:  .  lisinopril-hydrochlorothiazide (PRINZIDE,ZESTORETIC) 10-12.5 MG tablet, Take 1 tablet by mouth daily., Disp: 90 tablet, Rfl: 3  Allergies  Allergen Reactions  . Nsaids     ITP    Objective:   VITALS: Per patient if applicable, see vitals. GENERAL: Alert, appears well and in no acute distress. HEENT: Atraumatic, conjunctiva clear, no obvious abnormalities on inspection of external nose and ears. NECK: Normal movements of the head and neck. CARDIOPULMONARY: No increased WOB. Speaking in clear sentences. I:E ratio WNL.  MS: Moves all visible extremities without noticeable abnormality. PSYCH: Pleasant and cooperative, well-groomed. Speech normal rate and rhythm. Affect is appropriate. Insight and judgement are appropriate. Attention is focused, linear, and appropriate.  NEURO: CN grossly intact. Oriented as arrived to appointment on time with no prompting. Moves both UE equally.  SKIN: No obvious lesions, wounds, erythema, or cyanosis noted on face or hands.  Assessment and Plan:   [name] was seen today for irritable bowel syndrome.  Diagnoses and all orders for this visit:  Generalized abdominal pain   Suspect constipation vs IBS. Could have underlying food intolerance as well. Discussed working on bowel regimen miralax and colace daily over the weekend and call us Monday if not improved. If not improved, will do IN OFFICE visit (as long as covid screen neg)  and do abd exam, labs and possible imaging. Worsening sx over weekend, was instructed to go to ER/urgent care.  . Reviewed expectations re: course of current medical issues. . Discussed  self-management of symptoms. . Outlined signs and symptoms indicating need for more acute intervention. . Patient verbalized understanding and all questions were answered. Marland Kitchen Health Maintenance issues including appropriate healthy diet, exercise, and smoking avoidance were discussed with patient. . See orders for this visit as documented in the electronic medical record.  I discussed the assessment and treatment plan with the patient. The patient was provided an opportunity to ask questions and all were answered. The patient agreed with the plan and demonstrated an understanding of the instructions.   The patient was advised to call back or seek an in-person evaluation if the symptoms worsen or if the condition fails to improve as anticipated.   [place], [place] 07/10/2019

## 2019-08-28 ENCOUNTER — Encounter: Payer: Self-pay | Admitting: Family Medicine

## 2019-08-28 ENCOUNTER — Other Ambulatory Visit: Payer: Self-pay

## 2019-08-28 ENCOUNTER — Ambulatory Visit (INDEPENDENT_AMBULATORY_CARE_PROVIDER_SITE_OTHER): Payer: BC Managed Care – PPO | Admitting: Family Medicine

## 2019-08-28 VITALS — BP 152/102 | HR 82 | Temp 98.4°F | Ht 70.0 in | Wt 287.8 lb

## 2019-08-28 DIAGNOSIS — R040 Epistaxis: Secondary | ICD-10-CM

## 2019-08-28 DIAGNOSIS — I10 Essential (primary) hypertension: Secondary | ICD-10-CM | POA: Diagnosis not present

## 2019-08-28 DIAGNOSIS — D693 Immune thrombocytopenic purpura: Secondary | ICD-10-CM

## 2019-08-28 LAB — CBC WITH DIFFERENTIAL/PLATELET
Basophils Absolute: 0 10*3/uL (ref 0.0–0.1)
Basophils Relative: 0.3 % (ref 0.0–3.0)
Eosinophils Absolute: 0.2 10*3/uL (ref 0.0–0.7)
Eosinophils Relative: 4.8 % (ref 0.0–5.0)
HCT: 39.7 % (ref 36.0–46.0)
Hemoglobin: 13.1 g/dL (ref 12.0–15.0)
Lymphocytes Relative: 19.5 % (ref 12.0–46.0)
Lymphs Abs: 1 10*3/uL (ref 0.7–4.0)
MCHC: 32.9 g/dL (ref 30.0–36.0)
MCV: 82.8 fl (ref 78.0–100.0)
Monocytes Absolute: 0.5 10*3/uL (ref 0.1–1.0)
Monocytes Relative: 9 % (ref 3.0–12.0)
Neutro Abs: 3.4 10*3/uL (ref 1.4–7.7)
Neutrophils Relative %: 66.4 % (ref 43.0–77.0)
Platelets: 99 10*3/uL — ABNORMAL LOW (ref 150.0–400.0)
RBC: 4.8 Mil/uL (ref 3.87–5.11)
RDW: 15.3 % (ref 11.5–15.5)
WBC: 5.1 10*3/uL (ref 4.0–10.5)

## 2019-08-28 NOTE — Progress Notes (Signed)
Subjective  CC:  Chief Complaint  Patient presents with  . Epistaxis    This AM  . ITP    Chronic  . Excess Bruising    x1 week  . Headache    x1 week   Same day acute visit; PCP not available. New pt to me. Chart reviewed.   HPI: Bailey Sheppard is a 38 y.o. female who presents to the office today to address the problems listed above in the chief complaint.  38 yo with h/o ITP c/ 2 days of mild right sided nose bleed easily stopped and easy bruising x 2 weeks. Also has had menstrual cycles: on mirena with very intermittent cycles in the past. No petechia or excessive bleeding. Feels well but tired, headache. Hasn't been checking her blood pressures. Restarted antihypertensives after pregnancy but I don't see that she has had f/u to ensure control. No cp or sob. She sees Dr. Jana Hakim for itp but hasn't had visit since 11/2018 due to covid restrictions. She has had several crises due to ITP in the past.   HTN: on ace/hctz combo and reports good compliance.   BP Readings from Last 3 Encounters:  08/28/19 (!) 152/102  03/06/19 (!) 158/96  02/13/19 (!) 132/94     Lab Results  Component Value Date   CREATININE 0.76 03/06/2019   BUN 9 03/06/2019   NA 141 03/06/2019   K 3.5 03/06/2019   CL 104 03/06/2019   CO2 26 03/06/2019    Lab Results  Component Value Date   WBC 5.2 03/06/2019   HGB 13.1 03/06/2019   HCT 38.2 03/06/2019   MCV 81.3 03/06/2019   PLT 74 (L) 03/06/2019    Assessment  1. Idiopathic thrombocytopenic purpura (HCC)   2. Epistaxis   3. Essential hypertension      Plan   ITP:  Will recheck cbc to see where platelets are at. Further recs based on results  Discussed routine care for mild nose bleeds.   Will need f/u with hematology regardless  Uncontrolled hypertension: could be contributing to sxs: rec f/u with Dr. Juleen China to adjust meds. She will start checking bp's at home and bring in log.   Follow up: Return in about 2 weeks (around 09/11/2019)  for follow up Hypertension with Dr. Juleen China.  Visit date not found  Orders Placed This Encounter  Procedures  . CBC with Differential/Platelet   No orders of the defined types were placed in this encounter.     I reviewed the patients updated PMH, FH, and SocHx.    Patient Active Problem List   Diagnosis Date Noted  . Pure hypercholesterolemia 03/08/2019  . Normal postpartum course 09/19/2018  . Depression 09/19/2018  . Chronic hypertension during pregnancy 09/17/2018  . SVD (spontaneous vaginal delivery) 09/17/2018  . Morbid obesity with BMI of 40.0-44.9, adult ([place]) 12/03/2017  . Anxiety and depression 12/03/2017  . Hypertension 12/20/2016  . Hyponatremia 06/10/2013  . Hyperglycemia 06/10/2013  . Fibroid 05/13/2012  . Idiopathic thrombocytopenic purpura ([place]) 02/20/2012  . Iron deficiency anemia 02/20/2012   Current Meds  Medication Sig  . acetaminophen (TYLENOL) 500 MG tablet Take 500 mg by mouth every 6 (six) hours as needed for headache.  . lisinopril-hydrochlorothiazide (PRINZIDE,ZESTORETIC) 10-12.5 MG tablet Take 1 tablet by mouth daily.    Allergies: Patient is allergic to nsaids. Family History: Patient family history includes Diabetes in her father, maternal grandfather, and paternal grandmother; Heart failure in her father; Hypertension in her mother; Kidney failure in  her father and maternal grandfather; Lupus in her cousin. Social History:  Patient  reports that she has never smoked. She has never used smokeless tobacco. She reports that she does not drink alcohol or use drugs.  Review of Systems: Constitutional: Negative for fever malaise or anorexia Cardiovascular: negative for chest pain Respiratory: negative for SOB or persistent cough Gastrointestinal: negative for abdominal pain  Objective  Vitals: BP (!) 152/102 (BP Location: Left Arm, Patient Position: Sitting, Cuff Size: Large)   Pulse 82   Temp 98.4 F (36.9 C) (Oral)   Ht 5\' 10"  (1.778 m)    Wt 287 lb 12.8 oz (130.5 kg)   SpO2 98%   BMI 41.30 kg/m  General: no acute distress , A&Ox3 HEENT: PEERL, conjunctiva normal, right nare: clear w/o bleeding or clots, left nare clear, opharynx moist,neck is supple Cardiovascular:  RRR without murmur or gallop.  Respiratory:  Good breath sounds bilaterally, CTAB with normal respiratory effort Skin:  Warm, no rashes, no petichiae. Rare small bruise.  Normal joints     Commons side effects, risks, benefits, and alternatives for medications and treatment plan prescribed today were discussed, and the patient expressed understanding of the given instructions. Patient is instructed to call or message via MyChart if he/she has any questions or concerns regarding our treatment plan. No barriers to understanding were identified. We discussed Red Flag symptoms and signs in detail. Patient expressed understanding regarding what to do in case of urgent or emergency type symptoms.   Medication list was reconciled, printed and provided to the patient in AVS. Patient instructions and summary information was reviewed with the patient as documented in the AVS. This note was prepared with assistance of Dragon voice recognition software. Occasional wrong-word or sound-a-like substitutions may have occurred due to the inherent limitations of voice recognition software

## 2019-08-28 NOTE — Patient Instructions (Signed)
Please follow up if symptoms do not improve or as needed.  You may be due a follow up visit for your hypertension or complete physical. Please schedule when you can.   I will release your lab results to you on your MyChart account with further instructions. Please reply with any questions.

## 2019-10-15 DIAGNOSIS — Z20828 Contact with and (suspected) exposure to other viral communicable diseases: Secondary | ICD-10-CM | POA: Diagnosis not present

## 2019-11-19 ENCOUNTER — Emergency Department (HOSPITAL_COMMUNITY)
Admission: EM | Admit: 2019-11-19 | Discharge: 2019-11-19 | Disposition: A | Discharge status: Home / Self Care | Payer: BC Managed Care – PPO | Attending: Emergency Medicine | Admitting: Emergency Medicine

## 2019-11-19 ENCOUNTER — Encounter (HOSPITAL_COMMUNITY): Payer: Self-pay

## 2019-11-19 ENCOUNTER — Ambulatory Visit: Payer: Self-pay

## 2019-11-19 ENCOUNTER — Other Ambulatory Visit: Payer: Self-pay

## 2019-11-19 ENCOUNTER — Emergency Department (HOSPITAL_COMMUNITY): Payer: BC Managed Care – PPO

## 2019-11-19 DIAGNOSIS — R0602 Shortness of breath: Secondary | ICD-10-CM | POA: Insufficient documentation

## 2019-11-19 DIAGNOSIS — Z79899 Other long term (current) drug therapy: Secondary | ICD-10-CM | POA: Insufficient documentation

## 2019-11-19 DIAGNOSIS — R05 Cough: Secondary | ICD-10-CM | POA: Diagnosis not present

## 2019-11-19 DIAGNOSIS — R0789 Other chest pain: Secondary | ICD-10-CM | POA: Diagnosis not present

## 2019-11-19 DIAGNOSIS — I10 Essential (primary) hypertension: Secondary | ICD-10-CM | POA: Insufficient documentation

## 2019-11-19 DIAGNOSIS — R079 Chest pain, unspecified: Secondary | ICD-10-CM | POA: Insufficient documentation

## 2019-11-19 LAB — CBC
HCT: 41.3 % (ref 36.0–46.0)
Hemoglobin: 13.6 g/dL (ref 12.0–15.0)
MCH: 27 pg (ref 26.0–34.0)
MCHC: 32.9 g/dL (ref 30.0–36.0)
MCV: 82.1 fL (ref 80.0–100.0)
Platelets: 150 10*3/uL (ref 150–400)
RBC: 5.03 MIL/uL (ref 3.87–5.11)
RDW: 15.1 % (ref 11.5–15.5)
WBC: 6.7 10*3/uL (ref 4.0–10.5)
nRBC: 0 % (ref 0.0–0.2)

## 2019-11-19 LAB — BASIC METABOLIC PANEL
Anion gap: 10 (ref 5–15)
BUN: 9 mg/dL (ref 6–20)
CO2: 24 mmol/L (ref 22–32)
Calcium: 9.1 mg/dL (ref 8.9–10.3)
Chloride: 105 mmol/L (ref 98–111)
Creatinine, Ser: 0.85 mg/dL (ref 0.44–1.00)
GFR calc Af Amer: >60 mL/min (ref >60)
GFR calc non Af Amer: >60 mL/min (ref >60)
Glucose, Bld: 104 mg/dL — ABNORMAL HIGH (ref 70–99)
Potassium: 3.5 mmol/L (ref 3.5–5.1)
Sodium: 139 mmol/L (ref 135–145)

## 2019-11-19 LAB — D-DIMER, QUANTITATIVE: D-Dimer, Quant: 0.32 ug/mL-FEU (ref 0.00–0.50)

## 2019-11-19 LAB — TROPONIN I (HIGH SENSITIVITY)
Troponin I (High Sensitivity): <2 ng/L (ref <18)
Troponin I (High Sensitivity): <2 ng/L (ref <18)

## 2019-11-19 LAB — I-STAT BETA HCG BLOOD, ED (MC, WL, AP ONLY): I-stat hCG, quantitative: <5 m[IU]/mL (ref <5)

## 2019-11-19 MED ORDER — SODIUM CHLORIDE 0.9% FLUSH
3.0000 mL | Freq: Once | INTRAVENOUS | Status: AC
  Administered 2019-11-19: 3 mL via INTRAVENOUS

## 2019-11-19 MED ORDER — PREDNISONE 10 MG PO TABS: 40.0000 mg | ORAL_TABLET | Freq: Every day | ORAL | 0 refills | Status: AC

## 2019-11-19 NOTE — ED Triage Notes (Signed)
Pt presents with c/o chest pain that started yesterday in the center of her chest. Pt reports pain around her upper neck last week but the pain moved to her chest and became more severe yesterday. Pt also reports some shortness of breath with the pain.

## 2019-11-19 NOTE — Discharge Instructions (Addendum)
Take prednisone as prescribed and complete the full course.  Follow-up with your primary care provider if pain persists.  Return to ER for new or worsening symptoms.

## 2019-11-19 NOTE — Telephone Encounter (Signed)
Noted. Agree with ED dispo.  Algis Greenhouse. Jerline Pain, MD 11/19/2019 12:31 PM

## 2019-11-19 NOTE — Telephone Encounter (Signed)
Pt. Called to report mid chest pain x 1 week that has recently worsened.  Reported a constant pain with intermittent sharp pain, "like a dagger" in middle of chest, and to the right of the sternum.  Rated 5-8/10.  Reported it is worse when laying down.  Stated she feels slight pain with talking and taking a deep breath.  C/o mild shortness of breath, nausea, and feels intermittent dizziness.    Advised to go to the ER at this time.  Advised to have another adult drive her.  Pt. Verb. Understanding.   Reason for Disposition . [1] Chest pain lasts > 5 minutes AND [2] occurred in past 3 days (72 hours)  Answer Assessment - Initial Assessment Questions 1. LOCATION: "Where does it hurt?"       Mid chest and right of breast bone 2. RADIATION: "Does the pain go anywhere else?" (e.g., into neck, jaw, arms, back)     Pulls on right side of neck at times  3. ONSET: "When did the chest pain begin?" (Minutes, hours or days)      About one week ago 4. PATTERN "Does the pain come and go, or has it been constant since it started?"  "Does it get worse with exertion?"      Mostly is present but worse with laying down 5. DURATION: "How long does it last" (e.g., seconds, minutes, hours)     Feels it most of time but has intermittent very sharp pain 6. SEVERITY: "How bad is the pain?"  (e.g., Scale 1-10; mild, moderate, or severe)    - MILD (1-3): doesn't interfere with normal activities     - MODERATE (4-7): interferes with normal activities or awakens from sleep    - SEVERE (8-10): excruciating pain, unable to do any normal activities       Varies 5/10 - 8/10 7. CARDIAC RISK FACTORS: "Do you have any history of heart problems or risk factors for heart disease?" (e.g., angina, prior heart attack; diabetes, high blood pressure, high cholesterol, smoker, or strong family history of heart disease)     HTN ; reported hx of Pericarditis 8. PULMONARY RISK FACTORS: "Do you have any history of lung disease?"  (e.g.,  blood clots in lung, asthma, emphysema, birth control pills)     *No Answer* 9. CAUSE: "What do you think is causing the chest pain?"     unknown 10. OTHER SYMPTOMS: "Do you have any other symptoms?" (e.g., dizziness, nausea, vomiting, sweating, fever, difficulty breathing, cough)       Nausea, shortness of breath, intermittent dizziness 11. PREGNANCY: "Is there any chance you are pregnant?" "When was your last menstrual period?"       LMP / has [facility]  Protocols used: CHEST PAIN-A-AH

## 2019-11-19 NOTE — ED Provider Notes (Signed)
[facility] DEPT Provider Note   CSN: [id number] Arrival date & time: 11/19/19  0857     History   Chief Complaint Chief Complaint  Patient presents with  . Chest Pain    HPI Bailey Sheppard is a 38 y.o. female.     38 year old female with history of ITP, hypertension, pericarditis presents with complaint of chest pain.  Patient reports having a cold 3 weeks ago, had a negative Covid test at that time, reports occasional nonproductive cough at this time without fevers or chills.  Patient states that she developed midsternal sharp intermittent chest pain which radiates to the right side of her neck about a week ago, pain was worse last night, has improved as of today.  Patient states pain is similar to when she had pericarditis 6 years ago, states slightly worse with lying supine and slightly worse with deep breaths.  Patient does not take OCPs, is a non-smoker, no recent extended travel, no history of PE or DVT. Denies easy bruising or bleeding currently.  Reports history of hypertension, states she is noncompliant with her medications yesterday forgets to take it due to the stress of having a 64-year-old baby and work, also states it makes her feel unwell.  No other complaints or concerns     Past Medical History:  Diagnosis Date  . Acute pericarditis, unspecified 06/11/2013  . AMA (advanced maternal age) multigravida 13+   . Anemia   . Anxiety   . Clotting disorder ([place])   . Depression   . Elevated hemoglobin A1c 2018   5.7  . Fibroid   . Fibroids   . Hypertension   . ITP (idiopathic thrombocytopenic purpura)   . Obesity   . Vaginal Pap smear, abnormal     Patient Active Problem List   Diagnosis Date Noted  . Pure hypercholesterolemia 03/08/2019  . Normal postpartum course 09/19/2018  . Depression 09/19/2018  . Chronic hypertension during pregnancy 09/17/2018  . SVD (spontaneous vaginal delivery) 09/17/2018  . Morbid obesity with BMI  of 40.0-44.9, adult ([place]) 12/03/2017  . Anxiety and depression 12/03/2017  . Hypertension 12/20/2016  . Hyponatremia 06/10/2013  . Hyperglycemia 06/10/2013  . Fibroid 05/13/2012  . Idiopathic thrombocytopenic purpura ([place]) 02/20/2012  . Iron deficiency anemia 02/20/2012    Past Surgical History:  Procedure Laterality Date  . INTRAUTERINE DEVICE (IUD) INSERTION  04/2012  . UMBILICAL HERNIA REPAIR       OB History    Gravida  4   Para  3   Term  3   Preterm  0   AB  1   Living  3     SAB  0   TAB  1   Ectopic  0   Multiple  0   Live Births  1            Home Medications    Prior to Admission medications   Medication Sig Start Date End Date Taking? Authorizing Provider  acetaminophen (TYLENOL) 500 MG tablet Take 500 mg by mouth every 6 (six) hours as needed for headache.    [provider]  lisinopril-hydrochlorothiazide (PRINZIDE,ZESTORETIC) 10-12.5 MG tablet Take 1 tablet by mouth daily. 03/06/19   Briscoe Deutscher, DO  predniSONE (DELTASONE) 10 MG tablet Take 4 tablets (40 mg total) by mouth daily for 5 days. 11/19/19 11/24/19  Tacy Learn, PA-C    Family History Family History  Problem Relation Age of Onset  . Hypertension Mother   . Diabetes  Father   . Kidney failure Father   . Heart failure Father   . Lupus Cousin   . Diabetes Maternal Grandfather   . Kidney failure Maternal Grandfather   . Diabetes Paternal Grandmother     Social History Social History   Tobacco Use  . Smoking status: Never Smoker  . Smokeless tobacco: Never Used  Substance Use Topics  . Alcohol use: No    Frequency: Never  . Drug use: No     Allergies   Nsaids   Review of Systems Review of Systems  Constitutional: Negative for chills, diaphoresis and fever.  HENT: Negative for congestion.   Respiratory: Positive for cough and shortness of breath.   Cardiovascular: Positive for chest pain. Negative for palpitations and leg swelling.   Gastrointestinal: Negative for abdominal pain, nausea and vomiting.  Musculoskeletal: Negative for arthralgias, back pain, myalgias and neck pain.  Skin: Negative for rash and wound.  Neurological: Negative for dizziness, weakness and light-headedness.  Hematological: Negative for adenopathy. Does not bruise/bleed easily.  Psychiatric/Behavioral: Negative for confusion.  All other systems reviewed and are negative.    Physical Exam Updated Vital Signs BP (!) 119/98   Pulse 72   Temp 98.3 F (36.8 C) (Oral)   Resp (!) 26   Ht 5\' 10"  (1.778 m)   Wt 127 kg   SpO2 98%   BMI 40.18 kg/m   Physical Exam Vitals signs and nursing note reviewed.  Constitutional:      General: She is not in acute distress.    Appearance: She is well-developed. She is not diaphoretic.  HENT:     Head: Normocephalic and atraumatic.  Neck:     Musculoskeletal: Neck supple.  Cardiovascular:     Rate and Rhythm: Normal rate and regular rhythm.     Heart sounds: Normal heart sounds. No friction rub.  Pulmonary:     Effort: Pulmonary effort is normal.     Breath sounds: Normal breath sounds.  Chest:     Chest wall: No tenderness.  Abdominal:     Palpations: Abdomen is soft.     Tenderness: There is no abdominal tenderness.  Musculoskeletal:     Right lower leg: She exhibits no tenderness. No edema.     Left lower leg: She exhibits no tenderness. No edema.  Skin:    General: Skin is warm and dry.  Neurological:     Mental Status: She is alert and oriented to person, place, and time.  Psychiatric:        Behavior: Behavior normal.      ED Treatments / Results  Labs (all labs ordered are listed, but only abnormal results are displayed) Labs Reviewed  BASIC METABOLIC PANEL - Abnormal; Notable for the following components:      Result Value   Glucose, Bld 104 (*)    All other components within normal limits  CBC  D-DIMER, QUANTITATIVE (NOT AT [facility])  I-STAT BETA HCG BLOOD, ED ([facility], [facility], [facility]  ONLY)  TROPONIN I (HIGH SENSITIVITY)  TROPONIN I (HIGH SENSITIVITY)    EKG EKG Interpretation  Date/Time:  Thursday November 19 2019 09:07:47 EST Ventricular Rate:  88 PR Interval:    QRS Duration: 101 QT Interval:  365 QTC Calculation: 442 R Axis:   66 Text Interpretation: Sinus rhythm Probable left ventricular hypertrophy Baseline wander in lead(s) V1 Confirmed by Lennice Sites [phone]) on 11/19/2019 9:10:31 AM   Radiology Dg Chest 2 View  Result Date: 11/19/2019 CLINICAL DATA:  Chest  pain EXAM: CHEST - 2 VIEW COMPARISON:  06/11/03 FINDINGS: The heart size and mediastinal contours are within normal limits. Both lungs are clear. The visualized skeletal structures are unremarkable. IMPRESSION: No active cardiopulmonary disease. Electronically Signed   By: Kerby Moors M.D.   On: 11/19/2019 09:36    Procedures Procedures (including critical care time)  Medications Ordered in ED Medications  sodium chloride flush (NS) 0.9 % injection 3 mL (3 mLs Intravenous Given 11/19/19 1013)     Initial Impression / Assessment and Plan / ED Course  I have reviewed the triage vital signs and the nursing notes.  Pertinent labs & imaging results that were available during my care of the patient were reviewed by me and considered in my medical decision making (see chart for details).  Clinical Course as of Nov 18 1308  Thu Nov 18, 3462  862 38 year old female presents with complaint of pain in her chest, similar to when she had pericarditis 6 years ago.  EKG without acute ischemic changes, troponin x2 -, D-dimer within normal limits, CBC and BMP within normal meds, hCG negative.  Specifically regards to patient's ITP history, her platelets are within normal range.  Chest x-ray is unremarkable.  Discussed results with patient, patient feels reassured with her work-up today and will follow up with PCP if pain persists.  Plan is to treat with a short course of steroids, return to ER for new or  worsening symptoms.   [LM]    Clinical Course User Index [LM] Tacy Learn, PA-C      Final Clinical Impressions(s) / ED Diagnoses   Final diagnoses:  Chest pain, unspecified type    ED Discharge Orders         Ordered    predniSONE (DELTASONE) 10 MG tablet  Daily     11/19/19 1307           Tacy Learn, PA-C 11/19/19 1309    Daleen Bo, MD 11/20/19 1321

## 2019-11-19 NOTE — Telephone Encounter (Signed)
FYI no TOC just need someone to take look at this.

## 2019-11-19 NOTE — Telephone Encounter (Signed)
See note, patient does not have TOC. Unsure whom to send message to

## 2020-02-23 ENCOUNTER — Encounter: Payer: Self-pay | Admitting: Family Medicine

## 2020-02-23 ENCOUNTER — Other Ambulatory Visit: Payer: Self-pay

## 2020-02-23 ENCOUNTER — Ambulatory Visit (INDEPENDENT_AMBULATORY_CARE_PROVIDER_SITE_OTHER): Payer: BC Managed Care – PPO | Admitting: Family Medicine

## 2020-02-23 VITALS — BP 144/88 | HR 87 | Temp 97.9°F | Ht 70.0 in | Wt 302.0 lb

## 2020-02-23 DIAGNOSIS — D693 Immune thrombocytopenic purpura: Secondary | ICD-10-CM

## 2020-02-23 DIAGNOSIS — G43109 Migraine with aura, not intractable, without status migrainosus: Secondary | ICD-10-CM

## 2020-02-23 DIAGNOSIS — Z6841 Body Mass Index (BMI) 40.0 and over, adult: Secondary | ICD-10-CM | POA: Diagnosis not present

## 2020-02-23 DIAGNOSIS — I10 Essential (primary) hypertension: Secondary | ICD-10-CM

## 2020-02-23 LAB — CBC WITH DIFFERENTIAL/PLATELET
Basophils Absolute: 0 10*3/uL (ref 0.0–0.1)
Basophils Relative: 0.5 % (ref 0.0–3.0)
Eosinophils Absolute: 0.2 10*3/uL (ref 0.0–0.7)
Eosinophils Relative: 2.2 % (ref 0.0–5.0)
HCT: 41.4 % (ref 36.0–46.0)
Hemoglobin: 13.7 g/dL (ref 12.0–15.0)
Lymphocytes Relative: 21.9 % (ref 12.0–46.0)
Lymphs Abs: 1.6 10*3/uL (ref 0.7–4.0)
MCHC: 33.2 g/dL (ref 30.0–36.0)
MCV: 82.6 fl (ref 78.0–100.0)
Monocytes Absolute: 0.8 10*3/uL (ref 0.1–1.0)
Monocytes Relative: 11.7 % (ref 3.0–12.0)
Neutro Abs: 4.5 10*3/uL (ref 1.4–7.7)
Neutrophils Relative %: 63.7 % (ref 43.0–77.0)
Platelets: 122 10*3/uL — ABNORMAL LOW (ref 150.0–400.0)
RBC: 5.01 Mil/uL (ref 3.87–5.11)
RDW: 15.4 % (ref 11.5–15.5)
WBC: 7.1 10*3/uL (ref 4.0–10.5)

## 2020-02-23 LAB — COMPREHENSIVE METABOLIC PANEL
ALT: 25 U/L (ref 0–35)
AST: 16 U/L (ref 0–37)
Albumin: 4.6 g/dL (ref 3.5–5.2)
Alkaline Phosphatase: 79 U/L (ref 39–117)
BUN: 8 mg/dL (ref 6–23)
CO2: 27 mEq/L (ref 19–32)
Calcium: 9.4 mg/dL (ref 8.4–10.5)
Chloride: 103 mEq/L (ref 96–112)
Creatinine, Ser: 0.68 mg/dL (ref 0.40–1.20)
GFR: 117.41 mL/min (ref >60.00)
Glucose, Bld: 68 mg/dL — ABNORMAL LOW (ref 70–99)
Potassium: 3.7 mEq/L (ref 3.5–5.1)
Sodium: 139 mEq/L (ref 135–145)
Total Bilirubin: 0.9 mg/dL (ref 0.2–1.2)
Total Protein: 6.6 g/dL (ref 6.0–8.3)

## 2020-02-23 LAB — LIPID PANEL
Cholesterol: 190 mg/dL (ref 0–200)
HDL: 39.5 mg/dL (ref >39.00)
LDL Cholesterol: 137 mg/dL — ABNORMAL HIGH (ref 0–99)
NonHDL: 150.31
Total CHOL/HDL Ratio: 5
Triglycerides: 69 mg/dL (ref 0.0–149.0)
VLDL: 13.8 mg/dL (ref 0.0–40.0)

## 2020-02-23 LAB — TSH: TSH: 2.65 u[IU]/mL (ref 0.35–4.50)

## 2020-02-23 MED ORDER — HYDROCHLOROTHIAZIDE 25 MG PO TABS: 25.0000 mg | ORAL_TABLET | Freq: Every day | ORAL | 3 refills | Status: DC

## 2020-02-23 MED ORDER — TRAMADOL HCL 50 MG PO TABS: 50.0000 mg | ORAL_TABLET | Freq: Three times a day (TID) | ORAL | 0 refills | Status: DC | PRN

## 2020-02-23 MED ORDER — DILTIAZEM HCL ER COATED BEADS 240 MG PO CP24: 240.0000 mg | ORAL_CAPSULE | Freq: Every day | ORAL | 3 refills | Status: DC

## 2020-02-23 MED ORDER — ONDANSETRON 4 MG PO TBDP: 4.0000 mg | ORAL_TABLET | Freq: Three times a day (TID) | ORAL | 0 refills | Status: DC | PRN

## 2020-02-23 NOTE — Progress Notes (Signed)
Subjective  CC:  Chief Complaint  Patient presents with  . Migraine    migraines that come and go. vomits with them and dizziness and blurry vision   Former pt of Dr. Juleen China. Chart reviewed. Has TOC appt in June.  HPI: Bailey Sheppard is a 39 y.o. female who presents to the office today to address the problems listed above in the chief complaint.  Hypertension f/u: Control is poor. Pt reports she is doing well. She can't tolerate the ace/hctz combo; reports it makes her feel badly so she only takes it intermittently.   C/o worsening and more frequent migraine headaches. dxd 4-5 years ago; reports classic migrainous sxs: unilateral throbbing headache with associated n/v and photophobia. Now happening weekly. Has multiple hard to control triggers: certain odors (cig smoke, perfumes etc), stress, foods. Is to avoid nsaids due to h/o ITP, and has uncontrolled hypertension. Has never been on triptan before. Was on topamax in past and did not tolerate it. Mom has migraines as well and uses propranolol for prevention. No paresis or change in quality of sxs. Typically will sleep it off but has a 1yo child so this can be difficult.   ITP: reports has been stable.   Obesity: overdue for CPE.   Assessment  1. Migraine with aura and without status migrainosus, not intractable   2. Idiopathic thrombocytopenic purpura ([place])   3. Essential hypertension   4. Morbid obesity with BMI of 40.0-44.9, adult ([place])      Plan    Hypertension f/u: BP control is poorly controlled. Stop ace. Start cardizem and hctz and recheck 4-6 weeks. Low sodium diet recommended  Obesity f/u: work on improving diet. Screen for HLD and DM today  ITP: will need heme f/u. Monitor cbc  Migraines, worsening: education given on prevention and acute mgt. For now, will treat acute migraines with ultram and zofran; avoid triptans due to uncontrolled HTN and can't take nsaids. Start cardizem for prevention and HTN control.  Close f/u.   Education regarding management of these chronic disease states was given. Management strategies discussed on successive visits include dietary and exercise recommendations, goals of achieving and maintaining IBW, and lifestyle modifications aiming for adequate sleep and minimizing stressors.   Follow up: Return in about 6 weeks (around 04/05/2020) for follow up Hypertension, recheck migraines.  Orders Placed This Encounter  Procedures  . Comprehensive metabolic panel  . CBC with Differential/Platelet  . Lipid panel  . TSH  . Hemoglobin A1c   Meds ordered this encounter  Medications  . diltiazem (CARDIZEM CD) 240 MG 24 hr capsule    Sig: Take 1 capsule (240 mg total) by mouth daily.    Dispense:  90 capsule    Refill:  3  . hydrochlorothiazide (HYDRODIURIL) 25 MG tablet    Sig: Take 1 tablet (25 mg total) by mouth daily.    Dispense:  90 tablet    Refill:  3  . traMADol (ULTRAM) 50 MG tablet    Sig: Take 1 tablet (50 mg total) by mouth every 8 (eight) hours as needed for up to 5 days (migraine).    Dispense:  20 tablet    Refill:  0  . ondansetron (ZOFRAN ODT) 4 MG disintegrating tablet    Sig: Take 1 tablet (4 mg total) by mouth every 8 (eight) hours as needed for nausea or vomiting.    Dispense:  20 tablet    Refill:  0      BP Readings from  Last 3 Encounters:  02/23/20 (!) 144/88  11/19/19 (!) 120/95  08/28/19 (!) 152/102   Wt Readings from Last 3 Encounters:  02/23/20 (!) 302 lb (137 kg)  11/19/19 280 lb (127 kg)  08/28/19 287 lb 12.8 oz (130.5 kg)    Lab Results  Component Value Date   CHOL 185 03/06/2019   Lab Results  Component Value Date   HDL 42 (L) 03/06/2019   Lab Results  Component Value Date   LDLCALC 125 (H) 03/06/2019   Lab Results  Component Value Date   TRIG 81 03/06/2019   Lab Results  Component Value Date   CHOLHDL 4.4 03/06/2019   No results found for: LDLDIRECT Lab Results  Component Value Date   CREATININE 0.85  11/19/2019   BUN 9 11/19/2019   NA 139 11/19/2019   K 3.5 11/19/2019   CL 105 11/19/2019   CO2 24 11/19/2019    The ASCVD Risk score (Goff DC Jr., et al., 2013) failed to calculate for the following reasons:   The 2013 ASCVD risk score is only valid for ages 58 to 9  I reviewed the patients updated PMH, FH, and SocHx.    Patient Active Problem List   Diagnosis Date Noted  . Migraine with aura and without status migrainosus, not intractable 02/23/2020    Priority: High  . Morbid obesity with BMI of 40.0-44.9, adult ([place]) 12/03/2017    Priority: High  . Essential hypertension 12/20/2016    Priority: High  . Idiopathic thrombocytopenic purpura ([place]) 02/20/2012    Priority: High  . Pure hypercholesterolemia 03/08/2019    Priority: Medium  . Iron deficiency anemia 02/20/2012    Priority: Medium  . Fibroid 05/13/2012    Priority: Low    Allergies: Nsaids  Social History: Patient  reports that she has never smoked. She has never used smokeless tobacco. She reports that she does not drink alcohol or use drugs.  Current Meds  Medication Sig  . acetaminophen (TYLENOL) 500 MG tablet Take 500 mg by mouth every 6 (six) hours as needed for headache.  . [DISCONTINUED] lisinopril-hydrochlorothiazide (PRINZIDE,ZESTORETIC) 10-12.5 MG tablet Take 1 tablet by mouth daily.    Review of Systems: Cardiovascular: negative for chest pain, palpitations, leg swelling, orthopnea Respiratory: negative for SOB, wheezing or persistent cough Gastrointestinal: negative for abdominal pain Genitourinary: negative for dysuria or gross hematuria  Objective  Vitals: BP (!) 144/88 (BP Location: Right Arm, Patient Position: Sitting, Cuff Size: Large)   Pulse 87   Temp 97.9 F (36.6 C) (Temporal)   Ht 5\' 10"  (1.778 m)   Wt (!) 302 lb (137 kg)   SpO2 97%   BMI 43.33 kg/m  General: no acute distress  Psych:  Alert and oriented, normal mood and affect Neurologic:   Mental status is normal  Commons  side effects, risks, benefits, and alternatives for medications and treatment plan prescribed today were discussed, and the patient expressed understanding of the given instructions. Patient is instructed to call or message via MyChart if he/she has any questions or concerns regarding our treatment plan. No barriers to understanding were identified. We discussed Red Flag symptoms and signs in detail. Patient expressed understanding regarding what to do in case of urgent or emergency type symptoms.   Medication list was reconciled, printed and provided to the patient in AVS. Patient instructions and summary information was reviewed with the patient as documented in the AVS. This note was prepared with assistance of Dragon voice recognition software. Occasional wrong-word  or sound-a-like substitutions may have occurred due to the inherent limitations of voice recognition software  This visit occurred during the SARS-CoV-2 public health emergency.  Safety protocols were in place, including screening questions prior to the visit, additional usage of staff PPE, and extensive cleaning of exam room while observing appropriate contact time as indicated for disinfecting solutions.

## 2020-02-23 NOTE — Patient Instructions (Signed)
Please return in 4-6 weeks for blood pressure and migraine recheck.   Stop your bp pill. Start the cardizem and hctz daily together for blood pressure and prevention of headaches.  Take the ultram and zofran as needed for migraines.  Work on controlling triggers.    If you have any questions or concerns, please don't hesitate to send me a message via MyChart or call the office at [phone]. Thank you for visiting with Bailey Sheppard today! It's our pleasure caring for you.   Recurrent Migraine Headache  Migraines are a type of headache, and they are usually stronger and more sudden than normal headaches (tension headaches). Migraines are characterized by an intense pulsing, throbbing pain that is usually only present on one side of the head. Sometimes, migraine headaches can cause nausea, vomiting, sensitivity to light and sound, and vision changes. Recurrent migraines keep coming back (recurring). A migraine can last from 4 hours up to 3 days. What are the causes? The exact cause of this condition is not known. However, a migraine may be caused when nerves in the brain become irritated and release chemicals that cause inflammation of blood vessels. This inflammation causes pain. Certain things may also trigger migraines, such as:  A disruption in your regular eating and sleeping schedule.  Smoking.  Stress.  Menstruation.  Certain foods and drinks, such as: ? Aged cheese. ? Chocolate. ? Alcohol. ? Caffeine. ? Foods or drinks that contain nitrates, glutamate, aspartame, MSG, or tyramine.  Lack of sleep.  Hunger.  Physical exertion.  Fatigue.  High altitude.  Weather changes.  Medicines, such as: ? Nitroglycerin, which is used to treat chest pain. ? Birth control pills. ? Estrogen. ? Some blood pressure medicines. What are the signs or symptoms? Symptoms of this condition vary for each person and may include:  Pain that is usually only present on one side of the head. In  some cases, the pain may be on both sides of the head or around the head or neck.  Pulsating or throbbing pain.  Severe pain that prevents daily activities.  Pain that is aggravated by any physical activity.  Nausea, vomiting, or both.  Dizziness.  Pain with exposure to bright lights, loud noises, or activity.  General sensitivity to bright lights, loud noises, or smells. Before you get a migraine, you may get warning signs that a migraine is coming (aura). An aura may include:  Seeing flashing lights.  Seeing bright spots, halos, or zigzag lines.  Having tunnel vision or blurred vision.  Having numbness or a tingling feeling.  Having trouble talking.  Having muscle weakness.  Smelling a certain odor. How is this diagnosed? This condition is often diagnosed based on:  Your symptoms and medical history.  A physical exam. You may also have tests, including:  A CT scan or MRI of your brain. These imaging tests cannot diagnose migraines, but they can help to rule out other causes of headaches.  Blood tests. How is this treated? This condition is treated with:  Medicines. These are used for: ? Lessening pain and nausea. ? Preventing recurrent migraines.  Lifestyle changes, such as changes to your diet or sleeping patterns.  Behavior therapy, such as relaxation training or biofeedback. Biofeedback is a treatment that involves teaching you to relax and use your brain to lower your heart rate and control your breathing. Follow these instructions at home: Medicines  Take over-the-counter and prescription medicines only as told by your health care provider.  Do not drive  or use heavy machinery while taking prescription pain medicine. Lifestyle  Do not use any products that contain nicotine or tobacco, such as cigarettes and e-cigarettes. If you need help quitting, ask your health care provider.  Limit alcohol intake to no more than 1 drink a day for nonpregnant  women and 2 drinks a day for men. One drink equals 12 oz of beer, 5 oz of wine, or 1 oz of hard liquor.  Get 7-9 hours of sleep each night, or the amount of sleep recommended by your health care provider.  Limit your stress. Talk with your health care provider if you need help with stress management.  Maintain a healthy weight. If you need help losing weight, ask your health care provider.  Exercise regularly. Aim for 150 minutes of moderate-intensity exercise (walking, biking, yoga) or 75 minutes of vigorous exercise (running, circuit training, swimming) each week. General instructions   Keep a journal to find out what triggers your migraine headaches so you can avoid these triggers. For example, write down: ? What you eat and drink. ? How much sleep you get. ? Any change to your diet or medicines.  Lie down in a dark, quiet room when you have a migraine.  Try placing a cool towel over your head when you have a migraine.  Keep lights dim, if bright lights bother you and make your migraines worse.  Keep all follow-up visits as told by your health care provider. This is important. Contact a health care provider if:  Your pain does not improve, even with medicine.  Your migraines continue to return, even with medicine.  You have a fever.  You have weight loss. Get help right away if:  Your migraine becomes severe and medicine does not help.  You have a stiff neck.  You have a loss of vision.  You have muscle weakness or loss of muscle control.  You start losing your balance or have trouble walking.  You feel faint or you pass out.  You develop new, severe symptoms.  You start having abrupt severe headaches that last for a second or less, like a thunderclap. Summary  Migraine headaches are usually stronger and more sudden than normal headaches (tension headaches). Migraines are characterized by an intense pulsing, throbbing pain that is usually only present on one  side of the head.  The exact cause of this condition is not known. However, a migraine may be caused when nerves in the brain become irritated and release chemicals that cause inflammation of blood vessels.  Certain things may trigger migraines, such as changes to diet or sleeping patterns, smoking, certain foods, alcohol, stress, and certain medicines.  Sometimes, migraine headaches can cause nausea, vomiting, sensitivity to light and sound, and vision changes.  Migraines are often diagnosed based on your symptoms, medical history, and a physical exam. This information is not intended to replace advice given to you by your health care provider. Make sure you discuss any questions you have with your health care provider. Document Revised: 12/20/2017 Document Reviewed: 09/28/2016 Elsevier Patient Education  2020 [employer].

## 2020-02-24 ENCOUNTER — Telehealth: Payer: Self-pay | Admitting: Family Medicine

## 2020-02-24 ENCOUNTER — Other Ambulatory Visit: Payer: Self-pay

## 2020-02-24 DIAGNOSIS — G43109 Migraine with aura, not intractable, without status migrainosus: Secondary | ICD-10-CM

## 2020-02-24 DIAGNOSIS — I10 Essential (primary) hypertension: Secondary | ICD-10-CM

## 2020-02-24 MED ORDER — HYDROCHLOROTHIAZIDE 25 MG PO TABS: 25.0000 mg | ORAL_TABLET | Freq: Every day | ORAL | 3 refills | Status: DC

## 2020-02-24 MED ORDER — ONDANSETRON 4 MG PO TBDP: 4.0000 mg | ORAL_TABLET | Freq: Three times a day (TID) | ORAL | 0 refills | Status: DC | PRN

## 2020-02-24 MED ORDER — DILTIAZEM HCL ER COATED BEADS 240 MG PO CP24: 240.0000 mg | ORAL_CAPSULE | Freq: Every day | ORAL | 3 refills | Status: DC

## 2020-02-24 MED ORDER — TRAMADOL HCL 50 MG PO TABS: 50.0000 mg | ORAL_TABLET | Freq: Three times a day (TID) | ORAL | 0 refills | Status: AC | PRN

## 2020-02-24 NOTE — Telephone Encounter (Signed)
Patient states her scripts were sent to [facility] on [address] yesterday.  Pharmacy states that they had a power outage during this time and did not receive the scripts.  Patient is requesting these scripts to be sent again.

## 2020-02-24 NOTE — Telephone Encounter (Signed)
Medications resent to [facility]

## 2020-02-25 ENCOUNTER — Encounter: Payer: Self-pay | Admitting: Family Medicine

## 2020-02-25 DIAGNOSIS — R7303 Prediabetes: Secondary | ICD-10-CM | POA: Insufficient documentation

## 2020-02-25 LAB — HEMOGLOBIN A1C: Hgb A1c MFr Bld: 6.1 % (ref 4.6–6.5)

## 2020-02-26 ENCOUNTER — Ambulatory Visit: Payer: BC Managed Care – PPO | Admitting: Family Medicine

## 2020-03-16 ENCOUNTER — Ambulatory Visit (INDEPENDENT_AMBULATORY_CARE_PROVIDER_SITE_OTHER): Payer: BC Managed Care – PPO | Admitting: Family Medicine

## 2020-03-16 ENCOUNTER — Other Ambulatory Visit: Payer: Self-pay

## 2020-03-16 ENCOUNTER — Encounter: Payer: Self-pay | Admitting: Family Medicine

## 2020-03-16 VITALS — BP 118/72 | HR 72 | Temp 98.3°F | Ht 70.0 in | Wt 293.4 lb

## 2020-03-16 DIAGNOSIS — G43109 Migraine with aura, not intractable, without status migrainosus: Secondary | ICD-10-CM

## 2020-03-16 DIAGNOSIS — F4321 Adjustment disorder with depressed mood: Secondary | ICD-10-CM | POA: Diagnosis not present

## 2020-03-16 DIAGNOSIS — I10 Essential (primary) hypertension: Secondary | ICD-10-CM | POA: Diagnosis not present

## 2020-03-16 MED ORDER — ALPRAZOLAM 0.5 MG PO TABS: 0.5000 mg | ORAL_TABLET | Freq: Two times a day (BID) | ORAL | 0 refills | Status: DC | PRN

## 2020-03-16 NOTE — Progress Notes (Signed)
Subjective  CC:  Chief Complaint  Patient presents with  . Edema    left knee swelling goes up to thigh. Noticed swelling after switching to HCTZ   . Insomnia    fiance passed away recently    HPI: Bailey Sheppard is a 39 y.o. female who presents to the office today to address the problems listed above in the chief complaint.  Bailey Sheppard's fiancee of 19 years passed from massive MI 2 days ago. Bailey Sheppard found him in the bathroom. She is understandably having a hard time. Friends and family told her to come see me. She is having trouble sleeping, having some panic sxs, and has no appetite. She has a good support system with her family nearby. His side of the family is flying in from [place] tomorrow. She has 3 children, 15, 10 and 1.   Hypertension f/u: we started cardizem and HCTZ at last visit, now: Control is good . Pt reports she is doing well. taking medications as instructed, no medication side effects noted, no TIAs, no chest pain on exertion, no dyspnea on exertion, no swelling of ankles. She denies adverse effects from his BP medications. Compliance with medication is good.   Migraines: much improved on cardizem. Used tramadol x 2 since our last visit; hasn't had any more migraines since then. Was having frequently. See last note.   Knee swelling w/o pain. Now resolved.   Assessment  1. Grief   2. Essential hypertension   3. Migraine with aura and without status migrainosus, not intractable      Plan   Grief reaction: counseling done. Reassured: this is normal and to be expected. Xanax to use for sleep or panic as needed. Good support system. She is considering hospice counseling. F/u 2-4 weeks if needed  Hypertension f/u: BP control is well controlled. Continue current meds  Migraine f/u: now controlled on preventives, cardizem. Will monitor.  Education regarding management of these chronic disease states was given. Management strategies discussed on successive visits include  dietary and exercise recommendations, goals of achieving and maintaining IBW, and lifestyle modifications aiming for adequate sleep and minimizing stressors.   Follow up: as scheduled.   No orders of the defined types were placed in this encounter.  Meds ordered this encounter  Medications  . ALPRAZolam (XANAX) 0.5 MG tablet    Sig: Take 1 tablet (0.5 mg total) by mouth 2 (two) times daily as needed for anxiety.    Dispense:  30 tablet    Refill:  0      BP Readings from Last 3 Encounters:  03/16/20 118/72  02/23/20 (!) 144/88  11/19/19 (!) 120/95   Wt Readings from Last 3 Encounters:  03/16/20 293 lb 6.4 oz (133.1 kg)  02/23/20 (!) 302 lb (137 kg)  11/19/19 280 lb (127 kg)    Lab Results  Component Value Date   CHOL 190 02/23/2020   CHOL 185 03/06/2019   Lab Results  Component Value Date   HDL 39.50 02/23/2020   HDL 42 (L) 03/06/2019   Lab Results  Component Value Date   LDLCALC 137 (H) 02/23/2020   LDLCALC 125 (H) 03/06/2019   Lab Results  Component Value Date   TRIG 69.0 02/23/2020   TRIG 81 03/06/2019   Lab Results  Component Value Date   CHOLHDL 5 02/23/2020   CHOLHDL 4.4 03/06/2019   No results found for: LDLDIRECT Lab Results  Component Value Date   CREATININE 0.68 02/23/2020   BUN 8 02/23/2020  NA 139 02/23/2020   K 3.7 02/23/2020   CL 103 02/23/2020   CO2 27 02/23/2020    The ASCVD Risk score Mikey Bussing DC Jr., et al., 2013) failed to calculate for the following reasons:   The 2013 ASCVD risk score is only valid for ages 74 to 41  I reviewed the patients updated PMH, FH, and SocHx.    Patient Active Problem List   Diagnosis Date Noted  . Prediabetes 02/25/2020    Priority: High  . Migraine with aura and without status migrainosus, not intractable 02/23/2020    Priority: High  . Morbid obesity with BMI of 40.0-44.9, adult ([place]) 12/03/2017    Priority: High  . Essential hypertension 12/20/2016    Priority: High  . Idiopathic  thrombocytopenic purpura ([place]) 02/20/2012    Priority: High  . Pure hypercholesterolemia 03/08/2019    Priority: Medium  . Iron deficiency anemia 02/20/2012    Priority: Medium  . Fibroid 05/13/2012    Priority: Low    Allergies: Nsaids  Social History: Patient  reports that she has never smoked. She has never used smokeless tobacco. She reports that she does not drink alcohol or use drugs.  Current Meds  Medication Sig  . acetaminophen (TYLENOL) 500 MG tablet Take 500 mg by mouth every 6 (six) hours as needed for headache.  . diltiazem (CARDIZEM CD) 240 MG 24 hr capsule Take 1 capsule (240 mg total) by mouth daily.  . hydrochlorothiazide (HYDRODIURIL) 25 MG tablet Take 1 tablet (25 mg total) by mouth daily.  . ondansetron (ZOFRAN ODT) 4 MG disintegrating tablet Take 1 tablet (4 mg total) by mouth every 8 (eight) hours as needed for nausea or vomiting.    Review of Systems: Cardiovascular: negative for chest pain, palpitations, leg swelling, orthopnea Respiratory: negative for SOB, wheezing or persistent cough Gastrointestinal: negative for abdominal pain Genitourinary: negative for dysuria or gross hematuria  Objective  Vitals: BP 118/72 (BP Location: Left Arm, Patient Position: Sitting, Cuff Size: Large)   Pulse 72   Temp 98.3 F (36.8 C) (Temporal)   Ht 5\' 10"  (1.778 m)   Wt 293 lb 6.4 oz (133.1 kg)   SpO2 92%   BMI 42.10 kg/m  General: crying  Psych:  Alert and oriented, appropriate grief Knee: small effusion on left w/o warmth. From. Tr B LE edema  Commons side effects, risks, benefits, and alternatives for medications and treatment plan prescribed today were discussed, and the patient expressed understanding of the given instructions. Patient is instructed to call or message via MyChart if he/she has any questions or concerns regarding our treatment plan. No barriers to understanding were identified. We discussed Red Flag symptoms and signs in detail. Patient  expressed understanding regarding what to do in case of urgent or emergency type symptoms.   Medication list was reconciled, printed and provided to the patient in AVS. Patient instructions and summary information was reviewed with the patient as documented in the AVS. This note was prepared with assistance of Dragon voice recognition software. Occasional wrong-word or sound-a-like substitutions may have occurred due to the inherent limitations of voice recognition software  This visit occurred during the SARS-CoV-2 public health emergency.  Safety protocols were in place, including screening questions prior to the visit, additional usage of staff PPE, and extensive cleaning of exam room while observing appropriate contact time as indicated for disinfecting solutions.

## 2020-03-16 NOTE — Patient Instructions (Addendum)
Please follow up as scheduled for your next visit with me: 04/06/2020 OR you can reschedule as needed.   If you have any questions or concerns, please don't hesitate to send me a message via MyChart or call the office at [phone]. Thank you for visiting with [place] today! It's our pleasure caring for you.   Managing Loss, Adult People experience loss in many different ways throughout their lives. Events such as moving, changing jobs, and losing friends can create a sense of loss. The loss may be as serious as a major health change, divorce, death of a pet, or death of a loved one. All of these types of loss are likely to create a physical and emotional reaction known as grief. Grief is the result of a major change or an absence of something or someone that you count on. Grief is a normal reaction to loss. A variety of factors can affect your grieving experience, including:  The nature of your loss.  Your relationship to what or whom you lost.  Your understanding of grief and how to manage it.  Your support system. How to manage lifestyle changes Keep to your normal routine as much as possible.  If you have trouble focusing or doing normal activities, it is acceptable to take some time away from your normal routine.  Spend time with friends and loved ones.  Eat a healthy diet, get plenty of sleep, and rest when you feel tired. How to recognize changes  The way that you deal with your grief will affect your ability to function as you normally do. When grieving, you may experience these changes:  Numbness, shock, sadness, anxiety, anger, denial, and guilt.  Thoughts about death.  Unexpected crying.  A physical sensation of emptiness in your stomach.  Problems sleeping and eating.  Tiredness (fatigue).  Loss of interest in normal activities.  Dreaming about or imagining seeing the person who died.  A need to remember what or whom you lost.  Difficulty thinking about anything  other than your loss for a period of time.  Relief. If you have been expecting the loss for a while, you may feel a sense of relief when it happens. Follow these instructions at home:  Activity Express your feelings in healthy ways, such as:  Talking with others about your loss. It may be helpful to find others who have had a similar loss, such as a support group.  Writing down your feelings in a journal.  Doing physical activities to release stress and emotional energy.  Doing creative activities like painting, sculpting, or playing or listening to music.  Practicing resilience. This is the ability to recover and adjust after facing challenges. Reading some resources that encourage resilience may help you to learn ways to practice those behaviors. General instructions  Be patient with yourself and others. Allow the grieving process to happen, and remember that grieving takes time. ? It is likely that you may never feel completely done with some grief. You may find a way to move on while still cherishing memories and feelings about your loss. ? Accepting your loss is a process. It can take months or longer to adjust.  Keep all follow-up visits as told by your health care provider. This is important. Where to find support To get support for managing loss:  Ask your health care provider for help and recommendations, such as grief counseling or therapy.  Think about joining a support group for people who are managing a loss.  Where to find more information You can find more information about managing loss from:  American Society of Clinical Oncology: www.cancer.net  American Psychological Association: TVStere[employer].ch Contact a health care provider if:  Your grief is extreme and keeps getting worse.  You have ongoing grief that does not improve.  Your body shows symptoms of grief, such as illness.  You feel depressed, anxious, or lonely. Get help right away if:  You have  thoughts about hurting yourself or others. If you ever feel like you may hurt yourself or others, or have thoughts about taking your own life, get help right away. You can go to your nearest emergency department or call:  Your local emergency services (911 in the [place]  A suicide crisis helpline, such as the [facility] at (828) 385-9154. This is open 24 hours a day. Summary  Grief is the result of a major change or an absence of someone or something that you count on. Grief is a normal reaction to loss.  The depth of grief and the period of recovery depend on the type of loss and your ability to adjust to the change and process your feelings.  Processing grief requires patience and a willingness to accept your feelings and talk about your loss with people who are supportive.  It is important to find resources that work for you and to realize that people experience grief differently. There is not one grieving process that works for everyone in the same way.  Be aware that when grief becomes extreme, it can lead to more severe issues like isolation, depression, anxiety, or suicidal thoughts. Talk with your health care provider if you have any of these issues. This information is not intended to replace advice given to you by your health care provider. Make sure you discuss any questions you have with your health care provider. Document Revised: 02/20/2019 Document Reviewed: 05/02/2017 Elsevier Patient Education  [employer].

## 2020-03-18 ENCOUNTER — Encounter: Payer: Self-pay | Admitting: Certified Nurse Midwife

## 2020-04-06 ENCOUNTER — Ambulatory Visit: Payer: BC Managed Care – PPO | Admitting: Family Medicine

## 2020-05-05 ENCOUNTER — Other Ambulatory Visit: Payer: Self-pay

## 2020-05-05 ENCOUNTER — Ambulatory Visit (INDEPENDENT_AMBULATORY_CARE_PROVIDER_SITE_OTHER): Payer: No Typology Code available for payment source | Admitting: Family Medicine

## 2020-05-05 ENCOUNTER — Encounter: Payer: Self-pay | Admitting: Family Medicine

## 2020-05-05 VITALS — BP 122/76 | HR 99 | Temp 98.0°F | Resp 15 | Ht 70.0 in | Wt 295.2 lb

## 2020-05-05 DIAGNOSIS — I10 Essential (primary) hypertension: Secondary | ICD-10-CM | POA: Diagnosis not present

## 2020-05-05 DIAGNOSIS — Z0000 Encounter for general adult medical examination without abnormal findings: Secondary | ICD-10-CM

## 2020-05-05 DIAGNOSIS — G43109 Migraine with aura, not intractable, without status migrainosus: Secondary | ICD-10-CM

## 2020-05-05 DIAGNOSIS — R7303 Prediabetes: Secondary | ICD-10-CM

## 2020-05-05 DIAGNOSIS — D693 Immune thrombocytopenic purpura: Secondary | ICD-10-CM

## 2020-05-05 DIAGNOSIS — Z6841 Body Mass Index (BMI) 40.0 and over, adult: Secondary | ICD-10-CM

## 2020-05-05 NOTE — Progress Notes (Signed)
Subjective  Chief Complaint  Patient presents with  . Annual Exam    CPE, decreased urinating  . Hypertension    HPI: Bailey Sheppard is a 39 y.o. female who presents to [facility] today for a Female Wellness Visit.  She also has the concerns and/or needs as listed above in the chief complaint. These will be addressed in addition to the Health Maintenance Visit.   Wellness Visit: annual visit with health maintenance review and exam without Pap   HM: pap up to date. Diet is fairly good. Little exercise. Working on weight loss. Doing better now: appropriately grieved sudden death of brother; she and her family are now doing well. 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  Chronic disease management visit and/or acute problem visit:  HTN: now well controlled on meds w/o Aes. Feeling well. Taking medications w/o adverse effects. No symptoms of CHF, angina; no palpitations, sob, cp or lower extremity edema. Compliant with meds.   Migraines: improved but still with monthly. Stress related. Feels like they are manageable with nsaids and rests. No new sxs  ITP: stable by recent labwork. No bruising or petechiae  Prediabetes: discussed diet and weight as factors. Strong family history as well. No sxs of hyperglycemia.   Assessment  1. Annual physical exam   2. Morbid obesity with BMI of 40.0-44.9, adult ([place]   3. Idiopathic thrombocytopenic purpura ([facility]   4. Migraine with aura and without status migrainosus, not intractable   5. Essential hypertension   6. Prediabetes      Plan  Female Wellness Visit:  Age appropriate Health Maintenance and Prevention measures were discussed with patient. Included topics are cancer screening recommendations, ways to keep healthy (see AVS) including dietary and exercise recommendations, regular eye and dental care, use of seat belts, and avoidance of moderate alcohol use and tobacco use.   BMI:  discussed patient's BMI and encouraged positive lifestyle modifications to help get to or maintain a target BMI.  HM needs and immunizations were addressed and ordered. See below for orders. See HM and immunization section for updates. utd  Routine labs and screening tests ordered including cmp, cbc and lipids where appropriate.  Discussed recommendations regarding Vit D and calcium supplementation (see AVS)  Chronic disease f/u and/or acute problem visit: (deemed necessary to be done in addition to the wellness visit):  HTN:  This medical condition is well controlled. There are no signs of complications, medication side effects, or red flags. Patient is instructed to continue the current treatment plan without change in therapies or medications.   Obesity: counseling/education on diet and weight loss. Will start myfitnesspal for food tracking  ITP stable  Migraines are improved. Monitor. Return if becoming more frequent again. Continue CCB  Prediabetes: will monitor q 6 months. Work on diet and weight loss. Consider met or SGLT2-I if worsening.   IUD in place for birth control and menorrhagia/fibroid related. Doing well. Anemia has resolved.   Follow up: Return in about 6 months (around 11/05/2020) for follow up Hypertension, migraines.    Lifestyle: Body mass index is 42.36 kg/m. Wt Readings from Last 3 Encounters:  05/05/20 295 lb 3.2 oz (133.9 kg)  03/16/20 293 lb 6.4 oz (133.1 kg)  02/23/20 (!) 302 lb (137 kg)   Diet: low fat Exercise: rarely,  Need for contraception: Yes, IUD  Patient Active Problem List   Diagnosis Date Noted  . Prediabetes 02/25/2020  Priority: High    a1c 6.1 01/2020   . Migraine with aura and without status migrainosus, not intractable 02/23/2020    Priority: High  . Morbid obesity with BMI of 40.0-44.9, adult ([place]) 12/03/2017    Priority: High  . Essential hypertension 12/20/2016    Priority: High  . Idiopathic thrombocytopenic purpura  ([place]) 02/20/2012    Priority: High  . Pure hypercholesterolemia 03/08/2019    Priority: Medium  . IUD (intrauterine device) in place 08/01/2013    Priority: Medium    Postpartum 2020, Mirena Birth control and menorrhagia related anemia   . Fibroid 05/13/2012    Priority: Low   Health Maintenance  Topic Date Due  . COVID-19 Vaccine (1) 05/21/2020 (Originally 07/13/1998)  . INFLUENZA VACCINE  07/31/2020  . PAP SMEAR-Modifier  08/02/2020  . TETANUS/TDAP  07/30/2027  . HIV Screening  Completed   Immunization History  Administered Date(s) Administered  . Influenza, Seasonal, Injecte, Preservative Fre 10/31/2014, 11/29/2015  . Influenza,inj,Quad PF,6+ Mos 10/19/2016, 01/10/2018, 09/19/2018  . Tdap 07/29/2017   We updated and reviewed the patient's past history in detail and it is documented below. Allergies: Patient  reports no history of alcohol use. Past Medical History Patient  has a past medical history of Acute pericarditis, unspecified (06/11/2013), Anemia, Anxiety, Chronic hypertension during pregnancy (09/17/2018), Depression, Elevated hemoglobin A1c (2018), Fibroids, Hypertension, ITP (idiopathic thrombocytopenic purpura), Obesity, and Vaginal Pap smear, abnormal. Past Surgical History Patient  has a past surgical history that includes Umbilical hernia repair and Intrauterine device (iud) insertion (04/2012). Social History   Socioeconomic History  . Marital status: Soil scientist    Spouse name: donnell  . Number of children: 3  . Years of education: Not on file  . Highest education level: Not on file  Occupational History  . Occupation: Estate manager/land agent.    Employer: [employer]  Tobacco Use  . Smoking status: Never Smoker  . Smokeless tobacco: Never Used  Substance and Sexual Activity  . Alcohol use: No  . Drug use: No  . Sexual activity: Yes    Partners: Male    Birth control/protection: I.U.D.  Other Topics Concern  . Not on file  Social History  Narrative   Has 3 children   Social Determinants of Health   Financial Resource Strain:   . Difficulty of Paying Living Expenses:   Food Insecurity:   . Worried About Charity fundraiser in the Last Year:   . Arboriculturist in the Last Year:   Transportation Needs:   . Film/video editor (Medical):   Marland Kitchen Lack of Transportation (Non-Medical):   Physical Activity:   . Days of Exercise per Week:   . Minutes of Exercise per Session:   Stress:   . Feeling of Stress :   Social Connections:   . Frequency of Communication with Friends and Family:   . Frequency of Social Gatherings with Friends and Family:   . Attends Religious Services:   . Active Member of Clubs or Organizations:   . Attends Archivist Meetings:   Marland Kitchen Marital Status:    Family History  Problem Relation Age of Onset  . Hypertension Mother   . Diabetes Father   . Kidney failure Father   . Heart failure Father   . Diabetes Brother   . Lupus Cousin   . Diabetes Maternal Grandfather   . Kidney failure Maternal Grandfather   . Diabetes Paternal Grandmother     Review of Systems: Constitutional:  negative for fever or malaise Ophthalmic: negative for photophobia, double vision or loss of vision Cardiovascular: negative for chest pain, dyspnea on exertion, or new LE swelling Respiratory: negative for SOB or persistent cough Gastrointestinal: negative for abdominal pain, change in bowel habits or melena Genitourinary: negative for dysuria or gross hematuria, no abnormal uterine bleeding or disharge Musculoskeletal: negative for new gait disturbance or muscular weakness Integumentary: negative for new or persistent rashes, no breast lumps Neurological: negative for TIA or stroke symptoms Psychiatric: negative for SI or delusions Allergic/Immunologic: negative for hives  Patient Care Team    Relationship Specialty Notifications Start End  Leamon Arnt, MD PCP - General Family Medicine  02/23/20     Magrinat, Virgie Dad, MD Consulting Physician Hematology and Oncology  07/02/13   Regina Eck, CNM Consulting Physician Certified Nurse Midwife  07/15/18   Nunzio Cobbs, MD Consulting Physician Obstetrics and Gynecology  07/15/18     Objective  Vitals: BP 122/76   Pulse 99   Temp 98 F (36.7 C) (Temporal)   Resp 15   Ht 5' 10"  (1.778 m)   Wt 295 lb 3.2 oz (133.9 kg)   SpO2 96%   BMI 42.36 kg/m  General:  Well developed, well nourished, no acute distress  Psych:  Alert and orientedx3,normal mood and affect HEENT:  Normocephalic, atraumatic, non-icteric sclera, PERRL, supple neck without adenopathy, mass or thyromegaly Cardiovascular:  Normal S1, S2, RRR without gallop, rub or murmur Respiratory:  Good breath sounds bilaterally, CTAB with normal respiratory effort Gastrointestinal: normal bowel sounds, soft, non-tender, no noted masses. No HSM MSK: no deformities, contusions. Joints are without erythema or swelling.  Skin:  Warm, no rashes or suspicious lesions noted Neurologic:    Mental status is normal. Gross motor and sensory exams are normal. Normal gait. No tremor Breast Exam: No mass, skin retraction or nipple discharge is appreciated in either breast. No axillary adenopathy. Fibrocystic changes are not noted     Commons side effects, risks, benefits, and alternatives for medications and treatment plan prescribed today were discussed, and the patient expressed understanding of the given instructions. Patient is instructed to call or message via MyChart if he/she has any questions or concerns regarding our treatment plan. No barriers to understanding were identified. We discussed Red Flag symptoms and signs in detail. Patient expressed understanding regarding what to do in case of urgent or emergency type symptoms.   Medication list was reconciled, printed and provided to the patient in AVS. Patient instructions and summary information was reviewed with the patient  as documented in the AVS. This note was prepared with assistance of Dragon voice recognition software. Occasional wrong-word or sound-a-like substitutions may have occurred due to the inherent limitations of voice recognition software  This visit occurred during the SARS-CoV-2 public health emergency.  Safety protocols were in place, including screening questions prior to the visit, additional usage of staff PPE, and extensive cleaning of exam room while observing appropriate contact time as indicated for disinfecting solutions.

## 2020-05-05 NOTE — Patient Instructions (Signed)
Please return in 6 months for hypertension follow up.  Glad you are doing well. Things look good. Keep track of your sugar intake, log your foods, and monitor your headaches.   If you have any questions or concerns, please don't hesitate to send me a message via MyChart or call the office at [phone]. Thank you for visiting with [place] today! It's our pleasure caring for you.  Please do these things to maintain good health!   Exercise at least 30-45 minutes a day,  4-5 days a week.   Eat a low-fat diet with lots of fruits and vegetables, up to 7-9 servings per day.  Drink plenty of water daily. Try to drink 8 8oz glasses per day.  Seatbelts can save your life. Always wear your seatbelt.  Place Smoke Detectors on every level of your home and check batteries every year.  Schedule an appointment with an eye doctor for an eye exam every 1-2 years  Safe sex - use condoms to protect yourself from STDs if you could be exposed to these types of infections. Use birth control if you do not want to become pregnant and are sexually active.  Avoid heavy alcohol use. If you drink, keep it to less than 2 drinks/day and not every day.  [place].  Choose someone you trust that could speak for you if you became unable to speak for yourself.  Depression is common in our stressful world.If you're feeling down or losing interest in things you normally enjoy, please come in for a visit.  If anyone is threatening or hurting you, please get help. Physical or Emotional Violence is never OK.

## 2020-05-09 ENCOUNTER — Other Ambulatory Visit: Payer: Self-pay

## 2020-05-09 ENCOUNTER — Encounter (HOSPITAL_COMMUNITY): Payer: Self-pay | Admitting: Emergency Medicine

## 2020-05-09 ENCOUNTER — Other Ambulatory Visit (INDEPENDENT_AMBULATORY_CARE_PROVIDER_SITE_OTHER): Payer: No Typology Code available for payment source

## 2020-05-09 ENCOUNTER — Telehealth: Payer: Self-pay | Admitting: Family Medicine

## 2020-05-09 ENCOUNTER — Inpatient Hospital Stay (HOSPITAL_COMMUNITY)
Admission: EM | Admit: 2020-05-09 | Discharge: 2020-05-11 | DRG: 813 | Disposition: A | Discharge status: Home / Self Care | Payer: No Typology Code available for payment source | Attending: Family Medicine | Admitting: Family Medicine

## 2020-05-09 ENCOUNTER — Telehealth: Payer: Self-pay | Admitting: *Deleted

## 2020-05-09 DIAGNOSIS — Z841 Family history of disorders of kidney and ureter: Secondary | ICD-10-CM

## 2020-05-09 DIAGNOSIS — I309 Acute pericarditis, unspecified: Secondary | ICD-10-CM | POA: Diagnosis present

## 2020-05-09 DIAGNOSIS — D509 Iron deficiency anemia, unspecified: Secondary | ICD-10-CM | POA: Diagnosis present

## 2020-05-09 DIAGNOSIS — D693 Immune thrombocytopenic purpura: Principal | ICD-10-CM | POA: Diagnosis present

## 2020-05-09 DIAGNOSIS — Z833 Family history of diabetes mellitus: Secondary | ICD-10-CM

## 2020-05-09 DIAGNOSIS — Z79899 Other long term (current) drug therapy: Secondary | ICD-10-CM

## 2020-05-09 DIAGNOSIS — E1159 Type 2 diabetes mellitus with other circulatory complications: Secondary | ICD-10-CM | POA: Diagnosis not present

## 2020-05-09 DIAGNOSIS — Z8249 Family history of ischemic heart disease and other diseases of the circulatory system: Secondary | ICD-10-CM

## 2020-05-09 DIAGNOSIS — Z20822 Contact with and (suspected) exposure to covid-19: Secondary | ICD-10-CM | POA: Diagnosis present

## 2020-05-09 DIAGNOSIS — I10 Essential (primary) hypertension: Secondary | ICD-10-CM | POA: Diagnosis present

## 2020-05-09 DIAGNOSIS — Z79891 Long term (current) use of opiate analgesic: Secondary | ICD-10-CM

## 2020-05-09 LAB — CBC WITH DIFFERENTIAL/PLATELET
Abs Immature Granulocytes: 0.06 10*3/uL (ref 0.00–0.07)
Basophils Absolute: 0 10*3/uL (ref 0.0–0.1)
Basophils Absolute: 0 10*3/uL (ref 0.0–0.1)
Basophils Relative: 0.3 % (ref 0.0–3.0)
Basophils Relative: 0 %
Eosinophils Absolute: 0.2 10*3/uL (ref 0.0–0.7)
Eosinophils Absolute: 0.2 10*3/uL (ref 0.0–0.5)
Eosinophils Relative: 3.1 % (ref 0.0–5.0)
Eosinophils Relative: 3 %
HCT: 38.5 % (ref 36.0–46.0)
HCT: 40.2 % (ref 36.0–46.0)
Hemoglobin: 12.9 g/dL (ref 12.0–15.0)
Hemoglobin: 13.5 g/dL (ref 12.0–15.0)
Immature Granulocytes: 1 %
Lymphocytes Relative: 13.2 % (ref 12.0–46.0)
Lymphocytes Relative: 17 %
Lymphs Abs: 1 10*3/uL (ref 0.7–4.0)
Lymphs Abs: 1.3 10*3/uL (ref 0.7–4.0)
MCH: 27.8 pg (ref 26.0–34.0)
MCHC: 33.5 g/dL (ref 30.0–36.0)
MCHC: 33.6 g/dL (ref 30.0–36.0)
MCV: 83 fl (ref 78.0–100.0)
MCV: 82.9 fL (ref 80.0–100.0)
Monocytes Absolute: 0.7 10*3/uL (ref 0.1–1.0)
Monocytes Absolute: 0.7 10*3/uL (ref 0.1–1.0)
Monocytes Relative: 9.5 % (ref 3.0–12.0)
Monocytes Relative: 9 %
Neutro Abs: 5.6 10*3/uL (ref 1.4–7.7)
Neutro Abs: 5.4 10*3/uL (ref 1.7–7.7)
Neutrophils Relative %: 73.9 % (ref 43.0–77.0)
Neutrophils Relative %: 70 %
Platelets: 7 10*3/uL — CL (ref 150.0–400.0)
Platelets: 7 10*3/uL — CL (ref 150–400)
RBC: 4.64 Mil/uL (ref 3.87–5.11)
RBC: 4.85 MIL/uL (ref 3.87–5.11)
RDW: 14.9 % (ref 11.5–15.5)
RDW: 14.3 % (ref 11.5–15.5)
WBC: 7.6 10*3/uL (ref 4.0–10.5)
WBC: 7.7 10*3/uL (ref 4.0–10.5)
nRBC: 0 % (ref 0.0–0.2)

## 2020-05-09 LAB — BASIC METABOLIC PANEL
Anion gap: 12 (ref 5–15)
BUN: 13 mg/dL (ref 6–20)
CO2: 26 mmol/L (ref 22–32)
Calcium: 9.1 mg/dL (ref 8.9–10.3)
Chloride: 101 mmol/L (ref 98–111)
Creatinine, Ser: 0.97 mg/dL (ref 0.44–1.00)
GFR calc Af Amer: >60 mL/min (ref >60)
GFR calc non Af Amer: >60 mL/min (ref >60)
Glucose, Bld: 115 mg/dL — ABNORMAL HIGH (ref 70–99)
Potassium: 3.5 mmol/L (ref 3.5–5.1)
Sodium: 139 mmol/L (ref 135–145)

## 2020-05-09 MED ORDER — DEXAMETHASONE SODIUM PHOSPHATE 10 MG/ML IJ SOLN
8.0000 mg | Freq: Three times a day (TID) | INTRAMUSCULAR | Status: DC
  Administered 2020-05-10 – 2020-05-11 (×4): 8 mg via INTRAVENOUS
  Filled 2020-05-09 (×4): qty 1

## 2020-05-09 MED ORDER — HYDROCHLOROTHIAZIDE 25 MG PO TABS
25.0000 mg | ORAL_TABLET | Freq: Every day | ORAL | Status: DC
  Administered 2020-05-10 – 2020-05-11 (×2): 25 mg via ORAL
  Filled 2020-05-09 (×2): qty 1

## 2020-05-09 MED ORDER — ACETAMINOPHEN 325 MG PO TABS
650.0000 mg | ORAL_TABLET | Freq: Once | ORAL | Status: AC | PRN
  Administered 2020-05-09: 650 mg via ORAL
  Filled 2020-05-09: qty 2

## 2020-05-09 MED ORDER — MELATONIN 3 MG PO TABS
3.0000 mg | ORAL_TABLET | Freq: Every evening | ORAL | Status: DC | PRN
  Filled 2020-05-09: qty 1

## 2020-05-09 MED ORDER — TRAMADOL HCL 50 MG PO TABS
50.0000 mg | ORAL_TABLET | Freq: Four times a day (QID) | ORAL | Status: DC | PRN
  Administered 2020-05-10: 12:00:00 50 mg via ORAL
  Filled 2020-05-09: qty 1

## 2020-05-09 MED ORDER — ADULT MULTIVITAMIN W/MINERALS CH
1.0000 | ORAL_TABLET | Freq: Every day | ORAL | Status: DC
  Administered 2020-05-10 – 2020-05-11 (×2): 1 via ORAL
  Filled 2020-05-09 (×2): qty 1

## 2020-05-09 MED ORDER — ACETAMINOPHEN 500 MG PO TABS
500.0000 mg | ORAL_TABLET | Freq: Four times a day (QID) | ORAL | Status: DC | PRN
  Administered 2020-05-11: 500 mg via ORAL
  Filled 2020-05-09: qty 1

## 2020-05-09 MED ORDER — DEXAMETHASONE SODIUM PHOSPHATE 10 MG/ML IJ SOLN
10.0000 mg | Freq: Once | INTRAMUSCULAR | Status: AC
  Administered 2020-05-09: 10 mg via INTRAVENOUS
  Filled 2020-05-09: qty 1

## 2020-05-09 MED ORDER — DIPHENHYDRAMINE HCL 25 MG PO CAPS
50.0000 mg | ORAL_CAPSULE | Freq: Once | ORAL | Status: AC | PRN
  Administered 2020-05-09: 50 mg via ORAL
  Filled 2020-05-09: qty 2

## 2020-05-09 MED ORDER — ONDANSETRON HCL 4 MG/2ML IJ SOLN
4.0000 mg | Freq: Four times a day (QID) | INTRAMUSCULAR | Status: DC | PRN
  Administered 2020-05-09: 4 mg via INTRAVENOUS
  Filled 2020-05-09: qty 2

## 2020-05-09 MED ORDER — ONDANSETRON HCL 4 MG PO TABS: 4.0000 mg | ORAL_TABLET | Freq: Four times a day (QID) | ORAL | Status: DC | PRN

## 2020-05-09 MED ORDER — DILTIAZEM HCL ER COATED BEADS 240 MG PO CP24
240.0000 mg | ORAL_CAPSULE | Freq: Every day | ORAL | Status: DC
  Administered 2020-05-10 – 2020-05-11 (×2): 240 mg via ORAL
  Filled 2020-05-09: qty 2
  Filled 2020-05-09: qty 1

## 2020-05-09 MED ORDER — IMMUNE GLOBULIN (HUMAN) 10 GM/100ML IV SOLN
95.0000 g | Freq: Every day | INTRAVENOUS | Status: AC
  Administered 2020-05-10 (×2): 95 g via INTRAVENOUS
  Filled 2020-05-09 (×2): qty 800

## 2020-05-09 NOTE — Telephone Encounter (Signed)
This RN was notified that pt had labwork today showing low plts- and is very concerned.  This RN noted per lab draw today at Dr Bailey Sheppard ( Family MD ) plt count was 7000.   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  This RN called and spoke with the patient- she states she is having gum and nose bleeds and a lot of bruising.  This RN recommended she call the on call number - inform the nurse that answers - and ask her to have on call MD contact her ( on call is Dr Bailey Sheppard ) for best advice.   Bailey Sheppard verbalized understanding.

## 2020-05-09 NOTE — Telephone Encounter (Signed)
Please order cbc and get schedule for lab today or tomorrow. Thanks. Dx ITP.

## 2020-05-09 NOTE — Telephone Encounter (Signed)
Unable to LMOVM, mailbox full. Labs ordered future. Please schedule for lab appt

## 2020-05-09 NOTE — ED Triage Notes (Signed)
Pt arrived via personal vehicle. Pt has epistaxis and bruising. Pt went to the MD today and her platelets were 7000. Pt was referred to come to the emergency room by her primary MD. Pt has hx of ITP

## 2020-05-09 NOTE — H&P (Signed)
History and Physical    Bailey Sheppard [id number] DOB: 08/22/1981 DOA: 05/09/2020  PCP: Leamon Arnt, MD  Patient coming from: Home  I have personally briefly reviewed patient's old medical records in [place]  Chief Complaint: Bruising, gum bleeding  HPI: Bailey Sheppard is a 39 y.o. female with medical history significant of recurrent ITP.  Hospitalized June 2014 Decadron, 2 doses of IVIG, then decadron taper and outpt Rituxan.  Pt with ITP remission since that time it looks and sounds like until this past weekend 5/8 developed bruise on leg.  Then developed blood on tooth brush and finally petechaie which prompted her to go to PCP and have platelets checked.  Platelets <10k at PCP, sent in to ED.   ED Course: Platelets 7k in ED.  Dr. Jana Hakim consulted, hospitalist asked to admit for recurrent ITP.   Review of Systems: As per HPI, otherwise all review of systems negative.  Past Medical History:  Diagnosis Date  . Acute pericarditis, unspecified 06/11/2013  . Anemia   . Anxiety   . Chronic hypertension during pregnancy 09/17/2018  . Depression   . Elevated hemoglobin A1c 2018   5.7  . Fibroids   . Hypertension   . ITP (idiopathic thrombocytopenic purpura)   . Obesity   . Vaginal Pap smear, abnormal     Past Surgical History:  Procedure Laterality Date  . INTRAUTERINE DEVICE (IUD) INSERTION  04/2012  . UMBILICAL HERNIA REPAIR       reports that she has never smoked. She has never used smokeless tobacco. She reports that she does not drink alcohol or use drugs.  Allergies  Allergen Reactions  . Nsaids     ITP    Family History  Problem Relation Age of Onset  . Hypertension Mother   . Diabetes Father   . Kidney failure Father   . Heart failure Father   . Diabetes Brother   . Lupus Cousin   . Diabetes Maternal Grandfather   . Kidney failure Maternal Grandfather   . Diabetes Paternal Grandmother      Prior to Admission medications     Medication Sig Start Date End Date Taking? Authorizing Provider  acetaminophen (TYLENOL) 500 MG tablet Take 500 mg by mouth every 6 (six) hours as needed for headache.   Yes [provider]  diltiazem (CARDIZEM CD) 240 MG 24 hr capsule Take 1 capsule (240 mg total) by mouth daily. 02/24/20  Yes Leamon Arnt, MD  hydrochlorothiazide (HYDRODIURIL) 25 MG tablet Take 1 tablet (25 mg total) by mouth daily. 02/24/20  Yes Leamon Arnt, MD  levonorgestrel (MIRENA) 20 MCG/24HR IUD 1 each by Intrauterine route once.   Yes [provider]  MELATONIN GUMMIES PO Take 1 each by mouth at bedtime as needed (sleep).   Yes [provider]  Multiple Vitamin (MULTIVITAMIN WITH MINERALS) TABS tablet Take 1 tablet by mouth daily.   Yes [provider]  traMADol (ULTRAM) 50 MG tablet Take 50 mg by mouth every 6 (six) hours as needed for moderate pain.   Yes [provider]    Physical Exam: Vitals:   05/09/20 1947 05/09/20 2115  BP: (!) 164/89 133/79  Pulse: 90 81  Resp: 20 17  Temp: 98 F (36.7 C)   TempSrc: Oral   SpO2: 97% 97%  Weight: 131.5 kg   Height: 5\' 10"  (1.778 m)     Constitutional: NAD, calm, comfortable Eyes: PERRL, lids and conjunctivae normal ENMT: Mucous membranes are  moist. Posterior pharynx clear of any exudate or lesions.Normal dentition.  Neck: normal, supple, no masses, no thyromegaly Respiratory: clear to auscultation bilaterally, no wheezing, no crackles. Normal respiratory effort. No accessory muscle use.  Cardiovascular: Regular rate and rhythm, no murmurs / rubs / gallops. No extremity edema. 2+ pedal pulses. No carotid bruits.  Abdomen: no tenderness, no masses palpated. No hepatosplenomegaly. Bowel sounds positive.  Musculoskeletal: no clubbing / cyanosis. No joint deformity upper and lower extremities. Good ROM, no contractures. Normal muscle tone.  Skin: no rashes, lesions, ulcers. No induration Neurologic: CN 2-12 grossly  intact. Sensation intact, DTR normal. Strength 5/5 in all 4.  Psychiatric: Normal judgment and insight. Alert and oriented x 3. Normal mood.    Labs on Admission: I have personally reviewed following labs and imaging studies  CBC: Recent Labs  Lab 05/09/20 1420 05/09/20 2004  WBC 7.6 7.7  NEUTROABS 5.6 5.4  HGB 12.9 13.5  HCT 38.5 40.2  MCV 83.0 82.9  PLT 7.0 Repeated and verified X2.* 7*   Basic Metabolic Panel: Recent Labs  Lab 05/09/20 2004  NA 139  K 3.5  CL 101  CO2 26  GLUCOSE 115*  BUN 13  CREATININE 0.97  CALCIUM 9.1   GFR: Estimated Creatinine Clearance: 117.5 mL/min (by C-G formula based on SCr of 0.97 mg/dL). Liver Function Tests: No results for input(s): AST, ALT, ALKPHOS, BILITOT, PROT, ALBUMIN in the last 168 hours. No results for input(s): LIPASE, AMYLASE in the last 168 hours. No results for input(s): AMMONIA in the last 168 hours. Coagulation Profile: No results for input(s): INR, PROTIME in the last 168 hours. Cardiac Enzymes: No results for input(s): CKTOTAL, CKMB, CKMBINDEX, TROPONINI in the last 168 hours. BNP (last 3 results) No results for input(s): PROBNP in the last 8760 hours. HbA1C: No results for input(s): HGBA1C in the last 72 hours. CBG: No results for input(s): GLUCAP in the last 168 hours. Lipid Profile: No results for input(s): CHOL, HDL, LDLCALC, TRIG, CHOLHDL, LDLDIRECT in the last 72 hours. Thyroid Function Tests: No results for input(s): TSH, T4TOTAL, FREET4, T3FREE, THYROIDAB in the last 72 hours. Anemia Panel: No results for input(s): VITAMINB12, FOLATE, FERRITIN, TIBC, IRON, RETICCTPCT in the last 72 hours. Urine analysis:    Component Value Date/Time   COLORURINE YELLOW 01/21/2014 2018   APPEARANCEUR CLEAR 01/21/2014 2018   LABSPEC 1.022 01/21/2014 2018   LABSPEC 1.005 06/19/2013 1127   PHURINE 6.0 01/21/2014 2018   GLUCOSEU NEGATIVE 01/21/2014 2018   GLUCOSEU Negative 06/19/2013 1127   HGBUR NEGATIVE 01/21/2014  2018   [place] NEGATIVE 01/21/2014 2018   BILIRUBINUR Negative 06/19/2013 1127   [place] 01/21/2014 2018   PROTEINUR NEGATIVE 01/21/2014 2018   UROBILINOGEN 0.2 01/21/2014 2018   UROBILINOGEN 0.2 06/19/2013 1127   NITRITE NEGATIVE 01/21/2014 2018   LEUKOCYTESUR NEGATIVE 01/21/2014 2018   LEUKOCYTESUR Small 06/19/2013 1127    Radiological Exams on Admission: No results found.  EKG: Independently reviewed.  Assessment/Plan Principal Problem:   Idiopathic thrombocytopenic purpura (HCC) Active Problems:   Idiopathic thrombocytopenic purpura (ITP) (HCC)    1. Recurrent ITP - 1. As per Dr. Virgie Dad note: 1. Dexamethasone 10mg  x1 then 8mg  IV Q8H 2. IVIG 1g/kg daily x2 3. Will then need decadron taper and rituxan as outpt 2. Repeat CBC in AM.  DVT prophylaxis: SCDs Code Status: Full Family Communication: no family in room Disposition Plan: Home when platelets increase and stabilize, needs 2 days of IVIG first Consults called: Dr. Jana Hakim Admission status:  Admit to inpatient  Severity of Illness: The appropriate patient status for this patient is INPATIENT. Inpatient status is judged to be reasonable and necessary in order to provide the required intensity of service to ensure the patient's safety. The patient's presenting symptoms, physical exam findings, and initial radiographic and laboratory data in the context of their chronic comorbidities is felt to place them at high risk for further clinical deterioration. Furthermore, it is not anticipated that the patient will be medically stable for discharge from the hospital within 2 midnights of admission. The following factors support the patient status of inpatient.   IP status for 2 days of IVIG, IV decadron to treat recurrent ITP with platelets of 7.   * I certify that at the point of admission it is my clinical judgment that the patient will require inpatient hospital care spanning beyond 2 midnights from the  point of admission due to high intensity of service, high risk for further deterioration and high frequency of surveillance required.*    Cherelle Midkiff M. DO [facility]  How to contact the [facility] Attending or Consulting provider [place] or covering provider during after hours [place], for this patient?  1. Check the care team in [facility] and look for a) attending/consulting [facility] provider listed and b) the [facility] team listed 2. Log into [email or web address]  Amion Physician Scheduling and messaging for groups and whole hospitals  On call and physician scheduling software for group practices, residents, hospitalists and other medical providers for call, clinic, rotation and shift schedules. OnCall [employer] is a hospital-wide system for scheduling doctors and paging doctors on call. EasyPlot is for scientific plotting and data analysis.  [email or web address]  and use [place]'s universal password to access. If you do not have the password, please contact the hospital operator.  3. Locate the [facility] provider you are looking for under [facility] and page to a number that you can be directly reached. 4. If you still have difficulty reaching the provider, please page the [facility] (Director on Call) for the Hospitalists listed on amion for assistance.  05/09/2020, 9:47 PM

## 2020-05-09 NOTE — Telephone Encounter (Signed)
Patient calling was seen last Thursday, her ITT has flared up, Bruising , gums are bleeding pretty bad. Would like to see if she can come in for labs.

## 2020-05-09 NOTE — ED Provider Notes (Signed)
[facility] DEPT Provider Note   CSN: [id number] Arrival date & time: 05/09/20  1931     History Chief Complaint  Patient presents with  . Epistaxis    Bailey Sheppard is a 39 y.o. female.  39 yo F with a chief complaint of easy bruising and gum and nosebleeds.  She has a history of ITP.  Has had problems with this in the past.  Went to her family doctor's office and her blood count checked and saw that her platelets was 7000.  She then discussed it with her hematologist who told her to come to the ED for admission.  She feels that her nosebleeds and mouth bleeding has subsided a bit.  She denies any significant vaginal bleeding or GI bleeding.  She otherwise feels fine.  The history is provided by the patient.  Epistaxis Location:  Bilateral Severity:  Moderate Duration:  2 hours Timing:  Constant Progression:  Unchanged Chronicity:  New Relieved by:  Nothing Worsened by:  Nothing Associated symptoms: no congestion, no dizziness, no fever and no headaches        Past Medical History:  Diagnosis Date  . Acute pericarditis, unspecified 06/11/2013  . Anemia   . Anxiety   . Chronic hypertension during pregnancy 09/17/2018  . Depression   . Elevated hemoglobin A1c 2018   5.7  . Fibroids   . Hypertension   . ITP (idiopathic thrombocytopenic purpura)   . Obesity   . Vaginal Pap smear, abnormal     Patient Active Problem List   Diagnosis Date Noted  . Prediabetes 02/25/2020  . Migraine with aura and without status migrainosus, not intractable 02/23/2020  . Pure hypercholesterolemia 03/08/2019  . Morbid obesity with BMI of 40.0-44.9, adult ([place]) 12/03/2017  . Essential hypertension 12/20/2016  . IUD (intrauterine device) in place 08/01/2013  . Fibroid 05/13/2012  . Idiopathic thrombocytopenic purpura ([place]) 02/20/2012    Past Surgical History:  Procedure Laterality Date  . INTRAUTERINE DEVICE (IUD) INSERTION  04/2012  . UMBILICAL  HERNIA REPAIR       OB History    Gravida  4   Para  3   Term  3   Preterm  0   AB  1   Living  3     SAB  0   TAB  1   Ectopic  0   Multiple  0   Live Births  1           Family History  Problem Relation Age of Onset  . Hypertension Mother   . Diabetes Father   . Kidney failure Father   . Heart failure Father   . Diabetes Brother   . Lupus Cousin   . Diabetes Maternal Grandfather   . Kidney failure Maternal Grandfather   . Diabetes Paternal Grandmother     Social History   Tobacco Use  . Smoking status: Never Smoker  . Smokeless tobacco: Never Used  Substance Use Topics  . Alcohol use: No  . Drug use: No    Home Medications Prior to Admission medications   Medication Sig Start Date End Date Taking? Authorizing Provider  acetaminophen (TYLENOL) 500 MG tablet Take 500 mg by mouth every 6 (six) hours as needed for headache.    [provider]  diltiazem (CARDIZEM CD) 240 MG 24 hr capsule Take 1 capsule (240 mg total) by mouth daily. 02/24/20   Leamon Arnt, MD  hydrochlorothiazide (HYDRODIURIL) 25 MG tablet Take 1  tablet (25 mg total) by mouth daily. 02/24/20   Leamon Arnt, MD  levonorgestrel (MIRENA) 20 MCG/24HR IUD 1 each by Intrauterine route once.    [provider]  ondansetron (ZOFRAN ODT) 4 MG disintegrating tablet Take 1 tablet (4 mg total) by mouth every 8 (eight) hours as needed for nausea or vomiting. 02/24/20   Leamon Arnt, MD    Allergies    Nsaids  Review of Systems   Review of Systems  Constitutional: Negative for chills and fever.  HENT: Positive for dental problem and nosebleeds. Negative for congestion and rhinorrhea.   Eyes: Negative for redness and visual disturbance.  Respiratory: Negative for shortness of breath and wheezing.   Cardiovascular: Negative for chest pain and palpitations.  Gastrointestinal: Negative for nausea and vomiting.  Genitourinary: Negative for dysuria and urgency.    Musculoskeletal: Negative for arthralgias and myalgias.  Skin: Negative for pallor and wound.  Neurological: Negative for dizziness and headaches.    Physical Exam Updated Vital Signs BP (!) 164/89 (BP Location: Left Arm)   Pulse 90   Temp 98 F (36.7 C) (Oral)   Resp 20   Ht 5\' 10"  (1.778 m)   Wt 131.5 kg   SpO2 97%   BMI 41.61 kg/m   Physical Exam Vitals and nursing note reviewed.  Constitutional:      General: She is not in acute distress.    Appearance: She is well-developed. She is not diaphoretic.  HENT:     Head: Normocephalic and atraumatic.     Nose:     Comments: No obvious active bleeding, hypervascularity to the Kiesselbach's plexus    Mouth/Throat:     Comments: No obvious signs of bleeding Eyes:     Pupils: Pupils are equal, round, and reactive to light.  Cardiovascular:     Rate and Rhythm: Normal rate and regular rhythm.     Heart sounds: No murmur. No friction rub. No gallop.   Pulmonary:     Effort: Pulmonary effort is normal.     Breath sounds: No wheezing or rales.  Abdominal:     General: There is no distension.     Palpations: Abdomen is soft.     Tenderness: There is no abdominal tenderness.  Musculoskeletal:        General: No tenderness.     Cervical back: Normal range of motion and neck supple.     Comments: Scattered bruising mostly on the lower extremities.  Skin:    General: Skin is warm and dry.  Neurological:     Mental Status: She is alert and oriented to person, place, and time.  Psychiatric:        Behavior: Behavior normal.     ED Results / Procedures / Treatments   Labs (all labs ordered are listed, but only abnormal results are displayed) Labs Reviewed  CBC WITH DIFFERENTIAL/PLATELET - Abnormal; Notable for the following components:      Result Value   Platelets 7 (*)    All other components within normal limits  BASIC METABOLIC PANEL - Abnormal; Notable for the following components:   Glucose, Bld 115 (*)    All  other components within normal limits    EKG None  Radiology No results found.  Procedures Procedures (including critical care time)  Medications Ordered in ED Medications  dexamethasone (DECADRON) injection 10 mg (10 mg Intravenous Given 05/09/20 2009)    ED Course  I have reviewed the triage vital signs and the  nursing notes.  Pertinent labs & imaging results that were available during my care of the patient were reviewed by me and considered in my medical decision making (see chart for details).    MDM Rules/Calculators/A&P                      39 yo F with a chief complaints of easy bruising and bleeding.  Has a history of ITP.  Had her platelets checked today and they were 7000.  She unfortunately had failed outpatient therapy with a similar presentation and so the hematologist suggested she come here for admission.  I did discuss the case with Dr. Jana Hakim.  He recommended giving the patient a dose of 10 mg of IV Decadron and then 8 mg every 8.  Once she was admitted he would place orders for IVIG 1 mg/kg.   Repeat lab work here is consistent with a platelet count of 7.  Will discuss with hospitalist for admission.  CRITICAL CARE Performed by: Cecilio Asper   Total critical care time: 35 minutes  Critical care time was exclusive of separately billable procedures and treating other patients.  Critical care was necessary to treat or prevent imminent or life-threatening deterioration.  Critical care was time spent personally by me on the following activities: development of treatment plan with patient and/or surrogate as well as nursing, discussions with consultants, evaluation of patient's response to treatment, examination of patient, obtaining history from patient or surrogate, ordering and performing treatments and interventions, ordering and review of laboratory studies, ordering and review of radiographic studies, pulse oximetry and re-evaluation of patient's  condition.  The patients results and plan were reviewed and discussed.   Any x-rays performed were independently reviewed by myself.   Differential diagnosis were considered with the presenting HPI.  Medications  dexamethasone (DECADRON) injection 10 mg (10 mg Intravenous Given 05/09/20 2009)    Vitals:   05/09/20 1947  BP: (!) 164/89  Pulse: 90  Resp: 20  Temp: 98 F (36.7 C)  TempSrc: Oral  SpO2: 97%  Weight: 131.5 kg  Height: 5\' 10"  (1.778 m)    Final diagnoses:  Acute ITP (HCC)    Admission/ observation were discussed with the admitting physician, patient and/or family and they are comfortable with the plan.    Final Clinical Impression(s) / ED Diagnoses Final diagnoses:  Acute ITP [facility])    Rx / DC Orders ED Discharge Orders    None       Deno Etienne, DO 05/09/20 2111

## 2020-05-09 NOTE — ED Notes (Signed)
Date and time results received: 05/09/20 8:45 PM  (use smartphrase ".now" to insert current time)  Test: platelets  Critical Value: 7000  Name of Provider Notified: Deno Etienne, DO  Orders Received? Or Actions Taken?:

## 2020-05-09 NOTE — Progress Notes (Signed)
Preliminary note: Bailey Sheppard has a history of recurrent ITP, detailed below. She has been stable off treatment until this weekend, 5/8, when she developed a bruise on her leg. She then had blood on her toothbrush. Finally she developed petechiae and "that's when I started paying attention," as she has had them before. She called her primary MD Dr Jonni Sanger who checked a CBC showing a platelet count of <10K with otherwise normal counts. The patient called our office and was directed to the ED.  Past history ITP:  1.  ITP, requiring hospitalization in June 2014. Patient was treated with IV dexamethasone and IVIG. She was then on a dexamethasone taper between 06/22/2013 and 07/12/2013. She received 6 weekly doses of rituximab, followed by 2 q. three-week doses, with the final dose given on 08/21/2013.   Suggest: dexamethasone 10 mg IV bolus then 8 mg IV Q8, IVIG at 1 g/kg daily x2; hope for platelet count >20 in 48-72 at which time she could be d/c'd on oral steroids and set up for outpatient rituximab  Full consult to follow  GM

## 2020-05-10 ENCOUNTER — Encounter (HOSPITAL_COMMUNITY): Payer: Self-pay | Admitting: Internal Medicine

## 2020-05-10 ENCOUNTER — Telehealth: Payer: Self-pay | Admitting: Oncology

## 2020-05-10 ENCOUNTER — Other Ambulatory Visit: Payer: Self-pay | Admitting: Oncology

## 2020-05-10 DIAGNOSIS — E1159 Type 2 diabetes mellitus with other circulatory complications: Secondary | ICD-10-CM

## 2020-05-10 DIAGNOSIS — I10 Essential (primary) hypertension: Secondary | ICD-10-CM

## 2020-05-10 LAB — BASIC METABOLIC PANEL
Anion gap: 7 (ref 5–15)
BUN: 14 mg/dL (ref 6–20)
CO2: 25 mmol/L (ref 22–32)
Calcium: 9.1 mg/dL (ref 8.9–10.3)
Chloride: 104 mmol/L (ref 98–111)
Creatinine, Ser: 0.79 mg/dL (ref 0.44–1.00)
GFR calc Af Amer: >60 mL/min (ref >60)
GFR calc non Af Amer: >60 mL/min (ref >60)
Glucose, Bld: 171 mg/dL — ABNORMAL HIGH (ref 70–99)
Potassium: 3.7 mmol/L (ref 3.5–5.1)
Sodium: 136 mmol/L (ref 135–145)

## 2020-05-10 LAB — CBC
HCT: 36.5 % (ref 36.0–46.0)
Hemoglobin: 12.2 g/dL (ref 12.0–15.0)
MCH: 27.5 pg (ref 26.0–34.0)
MCHC: 33.4 g/dL (ref 30.0–36.0)
MCV: 82.4 fL (ref 80.0–100.0)
Platelets: 22 10*3/uL — CL (ref 150–400)
RBC: 4.43 MIL/uL (ref 3.87–5.11)
RDW: 14.2 % (ref 11.5–15.5)
WBC: 9 10*3/uL (ref 4.0–10.5)
nRBC: 0 % (ref 0.0–0.2)

## 2020-05-10 LAB — SARS CORONAVIRUS 2 BY RT PCR (HOSPITAL ORDER, PERFORMED IN ~~LOC~~ HOSPITAL LAB): SARS Coronavirus 2: NEGATIVE

## 2020-05-10 MED ORDER — DIPHENHYDRAMINE HCL 25 MG PO CAPS
25.0000 mg | ORAL_CAPSULE | Freq: Once | ORAL | Status: AC
  Administered 2020-05-10: 25 mg via ORAL
  Filled 2020-05-10: qty 1

## 2020-05-10 MED ORDER — PANTOPRAZOLE SODIUM 40 MG PO TBEC
40.0000 mg | DELAYED_RELEASE_TABLET | Freq: Every day | ORAL | Status: DC
  Administered 2020-05-10 – 2020-05-11 (×2): 40 mg via ORAL
  Filled 2020-05-10 (×2): qty 1

## 2020-05-10 MED ORDER — ACETAMINOPHEN 325 MG PO TABS
650.0000 mg | ORAL_TABLET | Freq: Once | ORAL | Status: AC
  Administered 2020-05-10: 650 mg via ORAL
  Filled 2020-05-10: qty 2

## 2020-05-10 MED ORDER — SODIUM CHLORIDE 0.9% FLUSH: 10.0000 mL | INTRAVENOUS | Status: DC | PRN

## 2020-05-10 NOTE — Discharge Planning (Addendum)
EDCM to follow for ED disposition needs.

## 2020-05-10 NOTE — Progress Notes (Signed)
[place]  Telephone:([phone] Fax:(336) [phone]     ID: [name] DOB: 16-Dec-1981  MR#: [id number]  KNL#:[id number]  Patient Care Team: Leamon Arnt, MD as PCP - General (Family Medicine) Jamylah Marinaccio, Virgie Dad, MD as Consulting Physician (Hematology and Oncology) Regina Eck, CNM as Consulting Physician (Certified Nurse Midwife) Yisroel Ramming, Everardo All, MD as Consulting Physician (Obstetrics and Gynecology) Chauncey Cruel, MD OTHER MD:  CHIEF COMPLAINT: Immune thrombocytopenia  CURRENT TREATMENT: Steroids, IVIG   HISTORY OF CURRENT ILLNESS: I last saw Ms. Cancelliere in 2019.  At that time she continued in remission from her ITP. She has been stable off treatment until this weekend, 05/07/2020, when she developed a bruise on her leg. She then had blood on her toothbrush. Finally she developed petechiae and "that's when I started paying attention," as she has had them before. She called her primary MD Dr Jonni Sanger who checked a CBC showing a platelet count of <10K with otherwise normal counts. The patient called our office and was directed to the ED.  Going back 2 weeks she is not aware of any intercurrent infections or change in medications.  Her 38 year old son is a Associate Professor and they have traveled to [place] in [place] in the last 2 weekends.  They will had some sinus issues but no fever or symptoms of flu.  She has not received a coronavirus vaccine.  The patient's subsequent history is as detailed below.  INTERVAL HISTORY: I met with Aaleigha in the emergency room 05/10/2020.  She is now on steroids and has received her first IVIG dose.  She tells me she is already feeling better.  Currently she denies any overt bleeding.  She does point to hematomas in her legs and multiple petechiae  REVIEW OF SYSTEMS: No intercurrent fever unusual headaches visual changes nausea vomiting staggering or falls.  No recent trauma.  No change in  medications.  No cough phlegm production or pleurisy.  No shortness of breath.  No change in bowel or bladder habits.  PAST MEDICAL HISTORY: Past Medical History:  Diagnosis Date  . Acute pericarditis, unspecified 06/11/2013  . Anemia   . Anxiety   . Chronic hypertension during pregnancy 09/17/2018  . Depression   . Elevated hemoglobin A1c 2018   5.7  . Fibroids   . Hypertension   . ITP (idiopathic thrombocytopenic purpura)   . Obesity   . Vaginal Pap smear, abnormal     PAST SURGICAL HISTORY: Past Surgical History:  Procedure Laterality Date  . INTRAUTERINE DEVICE (IUD) INSERTION  04/2012  . UMBILICAL HERNIA REPAIR      FAMILY HISTORY Family History  Problem Relation Age of Onset  . Hypertension Mother   . Diabetes Father   . Kidney failure Father   . Heart failure Father   . Diabetes Brother   . Lupus Cousin   . Diabetes Maternal Grandfather   . Kidney failure Maternal Grandfather   . Diabetes Paternal Grandmother     GYNECOLOGIC HISTORY:  G3 P3, Menarche age 75. Age of first live birth: 76.     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  Her husband was a Software engineer at a private school but passed away suddenly, at home, from a presumed myocardial infarction, February 2021.  (His family has a strong history of heart attacks and strokes).. Their 3 sons  are currently 61, 31, and less than 2 years  old ADVANCED DIRECTIVES: To be discussed   HEALTH MAINTENANCE: Social History   Tobacco Use  . Smoking status: Never Smoker  . Smokeless tobacco: Never Used  Substance Use Topics  . Alcohol use: No  . Drug use: No      Allergies  Allergen Reactions  . Nsaids     ITP    Current Facility-Administered Medications  Medication Dose Route Frequency Provider Last Rate Last Admin  . acetaminophen (TYLENOL) tablet 500 mg  500 mg Oral Q6H PRN Etta Quill, DO      . dexamethasone (DECADRON)  injection 8 mg  8 mg Intravenous Q8H Jennette Kettle M, DO   8 mg at 05/10/20 0603  . diltiazem (CARDIZEM CD) 24 hr capsule 240 mg  240 mg Oral Daily Alcario Drought, Jared M, DO      . hydrochlorothiazide (HYDRODIURIL) tablet 25 mg  25 mg Oral Daily Jennette Kettle M, DO      . Immune Globulin 10% (PRIVIGEN) IV infusion 95 g  95 g Intravenous QHS Etta Quill, DO   Stopped at 05/10/20 0518  . Melatonin Gummies CHEW   Oral QHS PRN Etta Quill, DO      . multivitamin with minerals tablet 1 tablet  1 tablet Oral Daily Jennette Kettle M, DO      . ondansetron [facility]) tablet 4 mg  4 mg Oral Q6H PRN Etta Quill, DO       Or  . ondansetron [facility]) injection 4 mg  4 mg Intravenous Q6H PRN Etta Quill, DO   4 mg at 05/09/20 2343  . sodium chloride flush (NS) 0.9 % injection 10-40 mL  10-40 mL Intracatheter PRN Etta Quill, DO      . traMADol Veatrice Bourbon) tablet 50 mg  50 mg Oral Q6H PRN Etta Quill, DO       Current Outpatient Medications  Medication Sig Dispense Refill  . acetaminophen (TYLENOL) 500 MG tablet Take 500 mg by mouth every 6 (six) hours as needed for headache.    . diltiazem (CARDIZEM CD) 240 MG 24 hr capsule Take 1 capsule (240 mg total) by mouth daily. 90 capsule 3  . hydrochlorothiazide (HYDRODIURIL) 25 MG tablet Take 1 tablet (25 mg total) by mouth daily. 90 tablet 3  . levonorgestrel (MIRENA) 20 MCG/24HR IUD 1 each by Intrauterine route once.    Marland Kitchen MELATONIN GUMMIES PO Take 1 each by mouth at bedtime as needed (sleep).    . Multiple Vitamin (MULTIVITAMIN WITH MINERALS) TABS tablet Take 1 tablet by mouth daily.    . traMADol (ULTRAM) 50 MG tablet Take 50 mg by mouth every 6 (six) hours as needed for moderate pain.      OBJECTIVE: African-American woman examined in bed  Vitals:   05/10/20 0730 05/10/20 0745  BP: 119/83 121/82  Pulse: 68 79  Resp: (!) 22 19  Temp:    SpO2: 97% 98%     Body mass index is 41.61 kg/m.   Wt Readings from Last 3 Encounters:    05/09/20 290 lb (131.5 kg)  05/05/20 295 lb 3.2 oz (133.9 kg)  03/16/20 293 lb 6.4 oz (133.1 kg)      ECOG FS:1 - Symptomatic but completely ambulatory  Ocular: Sclerae unicteric Ear-nose-throat: Wearing a mask Lungs no rales or rhonchi, auscultated anterolaterally Heart regular rate and rhythm Abd soft, obese, nontender, positive bowel sounds Neuro: non-focal, well-oriented, appropriate affect Breasts: Deferred   LAB RESULTS:  CMP  Component Value Date/Time   NA 136 05/10/2020 0546   NA 140 09/13/2014 1400   K 3.7 05/10/2020 0546   K 3.7 09/13/2014 1400   CL 104 05/10/2020 0546   CL 105 06/19/2013 0845   CO2 25 05/10/2020 0546   CO2 24 09/13/2014 1400   GLUCOSE 171 (H) 05/10/2020 0546   GLUCOSE 110 09/13/2014 1400   GLUCOSE 97 06/19/2013 0845   BUN 14 05/10/2020 0546   BUN 9.7 09/13/2014 1400   CREATININE 0.79 05/10/2020 0546   CREATININE 0.76 03/06/2019 1440   CREATININE 0.8 09/13/2014 1400   CALCIUM 9.1 05/10/2020 0546   CALCIUM 9.1 09/13/2014 1400   PROT 6.6 02/23/2020 1448   PROT 7.1 09/13/2014 1400   ALBUMIN 4.6 02/23/2020 1448   ALBUMIN 4.0 09/13/2014 1400   AST 16 02/23/2020 1448   AST 13 (L) 12/02/2018 1147   AST 14 09/13/2014 1400   ALT 25 02/23/2020 1448   ALT 20 12/02/2018 1147   ALT 13 09/13/2014 1400   ALKPHOS 79 02/23/2020 1448   ALKPHOS 53 09/13/2014 1400   BILITOT 0.9 02/23/2020 1448   BILITOT 1.3 (H) 12/02/2018 1147   BILITOT 0.95 09/13/2014 1400   GFRNONAA >60 05/10/2020 0546   GFRNONAA >60 12/02/2018 1147   GFRAA >60 05/10/2020 0546   GFRAA >60 12/02/2018 1147    No results found for: TOTALPROTELP, ALBUMINELP, A1GS, A2GS, BETS, BETA2SER, GAMS, MSPIKE, SPEI  No results found for: KPAFRELGTCHN, LAMBDASER, KAPLAMBRATIO  Lab Results  Component Value Date   WBC 9.0 05/10/2020   NEUTROABS 5.4 05/09/2020   HGB 12.2 05/10/2020   HCT 36.5 05/10/2020   MCV 82.4 05/10/2020   PLT 22 (LL) 05/10/2020    @LASTCHEMISTRY @  No results  found for: LABCA2  No components found for: [id number]  No results for input(s): INR in the last 168 hours.  No results found for: LABCA2  No results found for: FGH829  No results found for: HBZ169  No results found for: CVE938  No results found for: CA2729  No components found for: HGQUANT  No results found for: CEA1 / No results found for: CEA1   No results found for: AFPTUMOR  No results found for: CHROMOGRNA  No results found for: PSA1  Admission on 05/09/2020  Component Date Value Ref Range Status  . WBC 05/09/2020 7.7  4.0 - 10.5 K/uL Final  . RBC 05/09/2020 4.85  3.87 - 5.11 MIL/uL Final  . Hemoglobin 05/09/2020 13.5  12.0 - 15.0 g/dL Final  . HCT 05/09/2020 40.2  36.0 - 46.0 % Final  . MCV 05/09/2020 82.9  80.0 - 100.0 fL Final  . MCH 05/09/2020 27.8  26.0 - 34.0 pg Final  . MCHC 05/09/2020 33.6  30.0 - 36.0 g/dL Final  . RDW 05/09/2020 14.3  11.5 - 15.5 % Final  . Platelets 05/09/2020 7* 150 - 400 K/uL Final   Comment: SPECIMEN CHECKED FOR CLOTS Immature Platelet Fraction may be clinically indicated, consider ordering this additional test [id number] PLATELET COUNT CONFIRMED BY SMEAR REPEATED TO VERIFY   . nRBC 05/09/2020 0.0  0.0 - 0.2 % Final  . Neutrophils Relative % 05/09/2020 70  % Final  . Neutro Abs 05/09/2020 5.4  1.7 - 7.7 K/uL Final  . Lymphocytes Relative 05/09/2020 17  % Final  . Lymphs Abs 05/09/2020 1.3  0.7 - 4.0 K/uL Final  . Monocytes Relative 05/09/2020 9  % Final  . Monocytes Absolute 05/09/2020 0.7  0.1 - 1.0 K/uL Final  .  Eosinophils Relative 05/09/2020 3  % Final  . Eosinophils Absolute 05/09/2020 0.2  0.0 - 0.5 K/uL Final  . Basophils Relative 05/09/2020 0  % Final  . Basophils Absolute 05/09/2020 0.0  0.0 - 0.1 K/uL Final  . Immature Granulocytes 05/09/2020 1  % Final  . Abs Immature Granulocytes 05/09/2020 0.06  0.00 - 0.07 K/uL Final   Performed at [facility], [place] [address], [place], [place] [address]  .  Sodium 05/09/2020 139  135 - 145 mmol/L Final  . Potassium 05/09/2020 3.5  3.5 - 5.1 mmol/L Final  . Chloride 05/09/2020 101  98 - 111 mmol/L Final  . CO2 05/09/2020 26  22 - 32 mmol/L Final  . Glucose, Bld 05/09/2020 115* 70 - 99 mg/dL Final   Glucose reference range applies only to samples taken after fasting for at least 8 hours.  . BUN 05/09/2020 13  6 - 20 mg/dL Final  . Creatinine, Ser 05/09/2020 0.97  0.44 - 1.00 mg/dL Final  . Calcium 05/09/2020 9.1  8.9 - 10.3 mg/dL Final  . GFR calc non Af Amer 05/09/2020 >60  >60 mL/min Final  . GFR calc Af Amer 05/09/2020 >60  >60 mL/min Final  . Anion gap 05/09/2020 12  5 - 15 Final   Performed at [facility], [place] [address] [place], [place] [address]  . WBC 05/10/2020 9.0  4.0 - 10.5 K/uL Final  . RBC 05/10/2020 4.43  3.87 - 5.11 MIL/uL Final  . Hemoglobin 05/10/2020 12.2  12.0 - 15.0 g/dL Final  . HCT 05/10/2020 36.5  36.0 - 46.0 % Final  . MCV 05/10/2020 82.4  80.0 - 100.0 fL Final  . MCH 05/10/2020 27.5  26.0 - 34.0 pg Final  . MCHC 05/10/2020 33.4  30.0 - 36.0 g/dL Final  . RDW 05/10/2020 14.2  11.5 - 15.5 % Final  . Platelets 05/10/2020 22* 150 - 400 K/uL Final   Comment: CRITICAL VALUE NOTED.  VALUE IS CONSISTENT WITH PREVIOUSLY REPORTED AND CALLED VALUE. Immature Platelet Fraction may be clinically indicated, consider ordering this additional test [id number] REPEATED TO VERIFY   . nRBC 05/10/2020 0.0  0.0 - 0.2 % Final   Performed at [place] [address], [place], [place] [address]  . Sodium 05/10/2020 136  135 - 145 mmol/L Final  . Potassium 05/10/2020 3.7  3.5 - 5.1 mmol/L Final  . Chloride 05/10/2020 104  98 - 111 mmol/L Final  . CO2 05/10/2020 25  22 - 32 mmol/L Final  . Glucose, Bld 05/10/2020 171* 70 - 99 mg/dL Final   Glucose reference range applies only to samples taken after fasting for at least 8 hours.  . BUN 05/10/2020 14  6 - 20 mg/dL Final  . Creatinine, Ser  05/10/2020 0.79  0.44 - 1.00 mg/dL Final  . Calcium 05/10/2020 9.1  8.9 - 10.3 mg/dL Final  . GFR calc non Af Amer 05/10/2020 >60  >60 mL/min Final  . GFR calc Af Amer 05/10/2020 >60  >60 mL/min Final  . Anion gap 05/10/2020 7  5 - 15 Final   Performed at [facility], [place] [address] [place], [place] [address]  . SARS Coronavirus 2 05/09/2020 NEGATIVE  NEGATIVE Final   Comment: (NOTE) SARS-CoV-2 target nucleic acids are NOT DETECTED. The SARS-CoV-2 RNA is generally detectable in upper and lower respiratory specimens during the acute phase of infection. The lowest concentration of SARS-CoV-2 viral copies this assay can detect  is 250 copies / mL. A negative result does not preclude SARS-CoV-2 infection and should not be used as the sole basis for treatment or other patient management decisions.  A negative result may occur with improper specimen collection / handling, submission of specimen other than nasopharyngeal swab, presence of viral mutation(s) within the areas targeted by this assay, and inadequate number of viral copies (<250 copies / mL). A negative result must be combined with clinical observations, patient history, and epidemiological information. Fact Sheet for Patients:   StrictlyIdeas.no Fact Sheet for Healthcare Providers: BankingDealers.co.za This test is not yet approved or cleared                           by the [place] FDA and has been authorized for detection and/or diagnosis of SARS-CoV-2 by FDA under an Emergency Use Authorization (EUA).  This EUA will remain in effect (meaning this test can be used) for the duration of the COVID-19 declaration under Section 564(b)(1) of the Act, 21 U.S.C. section 360bbb-3(b)(1), unless the authorization is terminated or revoked sooner. Performed at [facility], [place] [address] [place], [place] [address]   Lab on 05/09/2020  Component  Date Value Ref Range Status  . WBC 05/09/2020 7.6  4.0 - 10.5 K/uL Final  . RBC 05/09/2020 4.64  3.87 - 5.11 Mil/uL Final  . Hemoglobin 05/09/2020 12.9  12.0 - 15.0 g/dL Final  . HCT 05/09/2020 38.5  36.0 - 46.0 % Final  . MCV 05/09/2020 83.0  78.0 - 100.0 fl Final  . MCHC 05/09/2020 33.5  30.0 - 36.0 g/dL Final  . RDW 05/09/2020 14.9  11.5 - 15.5 % Final  . Platelets 05/09/2020 7.0 Repeated and verified X2.* 150.0 - 400.0 K/uL Final   plt estimate 8,000  . Neutrophils Relative % 05/09/2020 73.9  43.0 - 77.0 % Final  . Lymphocytes Relative 05/09/2020 13.2  12.0 - 46.0 % Final  . Monocytes Relative 05/09/2020 9.5  3.0 - 12.0 % Final  . Eosinophils Relative 05/09/2020 3.1  0.0 - 5.0 % Final  . Basophils Relative 05/09/2020 0.3  0.0 - 3.0 % Final  . Neutro Abs 05/09/2020 5.6  1.4 - 7.7 K/uL Final  . Lymphs Abs 05/09/2020 1.0  0.7 - 4.0 K/uL Final  . Monocytes Absolute 05/09/2020 0.7  0.1 - 1.0 K/uL Final  . Eosinophils Absolute 05/09/2020 0.2  0.0 - 0.7 K/uL Final  . Basophils Absolute 05/09/2020 0.0  0.0 - 0.1 K/uL Final    (this displays the last labs from the last 3 days)  No results found for: TOTALPROTELP, ALBUMINELP, A1GS, A2GS, BETS, BETA2SER, GAMS, MSPIKE, SPEI (this displays SPEP labs)  No results found for: KPAFRELGTCHN, LAMBDASER, KAPLAMBRATIO (kappa/lambda light chains)  No results found for: HGBA, HGBA2QUANT, HGBFQUANT, HGBSQUAN (Hemoglobinopathy evaluation)   Lab Results  Component Value Date   LDH 159 07/14/2018    Lab Results  Component Value Date   IRON 49 04/25/2012   TIBC 391 04/25/2012   IRONPCTSAT 13 (L) 04/25/2012   (Iron and TIBC)  Lab Results  Component Value Date   FERRITIN 29 12/02/2018    Urinalysis    Component Value Date/Time   COLORURINE YELLOW 01/21/2014 2018   [place] 01/21/2014 2018   [place] 1.022 01/21/2014 2018   LABSPEC 1.005 06/19/2013 1127   PHURINE 6.0 01/21/2014 [place] 01/21/2014 2018    GLUCOSEU Negative 06/19/2013 [place] 01/21/2014  2018   [place] NEGATIVE 01/21/2014 2018   BILIRUBINUR Negative 06/19/2013 [place] 01/21/2014 2018   PROTEINUR NEGATIVE 01/21/2014 2018   UROBILINOGEN 0.2 01/21/2014 2018   UROBILINOGEN 0.2 06/19/2013 1127   NITRITE NEGATIVE 01/21/2014 2018   LEUKOCYTESUR NEGATIVE 01/21/2014 2018   LEUKOCYTESUR [place] 06/19/2013 1127     STUDIES: No results found.  ELIGIBLE FOR AVAILABLE RESEARCH PROTOCOL: no  ASSESSMENT: 39 y.o. [place], [place] woman:  1.  ITP, requiring hospitalization in June 2014. Patient was treated with IV dexamethasone and IVIG. She was then on a dexamethasone taper between 06/22/2013 and 07/12/2013. She received 6 weekly doses of rituximab, followed by 2 q. three-week doses, with the final dose given on 08/21/2013.  2. History of iron deficiency anemia,             (a) IV Feraheme was given in March 2013.             (b) received repeat IV Feraheme on 07/17/2018 and 07/24/2018 for a ferritin less than 6  3. Acute pericarditis - resolved   4.  Pregnancy complicated by iron deficiency and thrombocytopenia: Resolved  5.  Recurrent ITP 05/09/2020:  (a) dexamethasone started 05/09/2020  (b) IVIG given 05/09/2020, to be repeated 05/10/2020  PLAN: Entry is tolerated her IVIG last night and is tolerating her dexamethasone without any unusual side effects.  Her platelet count appears to be rapidly responding, which is very favorable.  I would continue treatment with the second IVIG dose today: It can be given as early as 8 or 9 PM.  We can then repeat a CBC tomorrow morning and assuming continuing response and no further bleeding issues the patient could be discharged on oral steroids 05/12/2019 1 in the morning.    I am going to be setting her up for weekly rituximab doses in our office hopefully beginning Thursday or Friday.  We will follow with you while in the hospital  Kamie  has a good understanding of the overall plan. She agrees with it. Chauncey Cruel, MD   05/10/2020 8:09 AM Medical Oncology and Hematology [facility] [address] [place], [place] [address] Tel. [phone]    Fax. [phone]

## 2020-05-10 NOTE — Progress Notes (Addendum)
PROGRESS NOTE  Bailey Sheppard [id number] DOB: [birth year] DOA: 05/09/2020 PCP: Leamon Arnt, MD   LOS: 1 day   Brief narrative: As per HPI,  Bailey Sheppard is a 39 y.o. female with medical history significant of recurrent ITP.  Hospitalized for the first time in June 2014 and received Decadron, 2 doses of IVIG, then decadron taper and outpt Rituxan. Pt with ITP remission since that time it looks and sounds like until this past weekend 05/07/20 when she developed bruise on leg.  Then developed blood on tooth brush and finally petechaie which prompted her to go to have platelets checked.  Platelets <10k and  the patient was sent in to ED. ED Course: Platelets 7k in ED.  Dr. Jana Hakim consulted.  Patient was then admitted to the hospital  Assessment/Plan:  Principal Problem:   Idiopathic thrombocytopenic purpura ([place]) Active Problems:   Idiopathic thrombocytopenic purpura (ITP) (HCC)  Recurrent ITP -patient has been seen by oncology.  She received dexamethasone 10 mg IV x1 and is currently on 8 mg every 8 hourly.  Patient is scheduled to receive 2 doses of IVIG.  Received 1 dose yesterday.  Will be receiving 2nd dose tonight.  Patient will likely need Decadron taper and rituximab as outpatient.  Platelet has improved to 22,000 from 7,000.  Will closely need to monitor.  Follow oncology recommendation.  We will keep on Protonix prophylactically  Essential hypertension.  On Cardizem and HCTZ.  Continue to monitor blood pressure.  VTE Prophylaxis: Sequential compression device due to low platelets  Code Status: Full code  Family Communication: None  Status is: Inpatient  Remains inpatient appropriate because:IV treatments appropriate due to intensity of illness or inability to take PO, Inpatient level of care appropriate due to severity of illness and Severe thrombocytopenia needing IVIG and closer monitoring.   Dispo: The patient is from: Home              Anticipated d/c is to:  Home              Anticipated d/c date is: 2 days, follow oncology recommendations              Patient currently is not medically stable to d/c.  Consultants:  Oncology Dr. Jana Hakim  Procedures:  IVIG transfusion  Antibiotics:  . None  Anti-infectives (From admission, onward)   None     Subjective:  Today, patient was seen and examined at bedside.  Denies any nausea, vomiting or bleeding bleeding.  Denies shortness of breath, cough, fever.  Complaints of bruising in her leg and some purpuric hemorrhage.  Objective: Vitals:   05/10/20 1130 05/10/20 1200  BP: (!) 150/94 (!) 151/79  Pulse: 87 88  Resp: (!) 23 (!) 28  Temp:    SpO2: 95% 94%   No intake or output data in the 24 hours ending 05/10/20 1307 Filed Weights   05/09/20 1947  Weight: 131.5 kg   Body mass index is 41.61 kg/m.   Physical Exam:  GENERAL: Patient is alert awake and oriented. Not in obvious distress.  Morbidly obese HENT: No scleral pallor or icterus. Pupils equally reactive to light. Oral mucosa is moist NECK: is supple, no gross swelling noted. CHEST: Clear to auscultation. No crackles or wheezes.  Diminished breath sounds bilaterally. CVS: S1 and S2 heard, no murmur. Regular rate and rhythm.  ABDOMEN: Soft, non-tender, bowel sounds are present. EXTREMITIES: No edema. CNS: Cranial nerves are intact. No focal motor deficits. SKIN: warm and dry,  bruise over the bilateral lower extremities, more on the right, petechial hemorrhages purpura on the lower extremities.  Data Review: I have personally reviewed the following laboratory data and studies,  CBC: Recent Labs  Lab 05/09/20 1420 05/09/20 2004 05/10/20 0546  WBC 7.6 7.7 9.0  NEUTROABS 5.6 5.4  --   HGB 12.9 13.5 12.2  HCT 38.5 40.2 36.5  MCV 83.0 82.9 82.4  PLT 7.0 Repeated and verified X2.* 7* 22*   Basic Metabolic Panel: Recent Labs  Lab 05/09/20 2004 05/10/20 0546  NA 139 136  K 3.5 3.7  CL 101 104  CO2 26 25  GLUCOSE  115* 171*  BUN 13 14  CREATININE 0.97 0.79  CALCIUM 9.1 9.1   Liver Function Tests: No results for input(s): AST, ALT, ALKPHOS, BILITOT, PROT, ALBUMIN in the last 168 hours. No results for input(s): LIPASE, AMYLASE in the last 168 hours. No results for input(s): AMMONIA in the last 168 hours. Cardiac Enzymes: No results for input(s): CKTOTAL, CKMB, CKMBINDEX, TROPONINI in the last 168 hours. BNP (last 3 results) No results for input(s): BNP in the last 8760 hours.  ProBNP (last 3 results) No results for input(s): PROBNP in the last 8760 hours.  CBG: No results for input(s): GLUCAP in the last 168 hours. Recent Results (from the past 240 hour(s))  SARS Coronavirus 2 by RT PCR (hospital order, performed in [facility] hospital lab) Nasopharyngeal Nasopharyngeal Swab     Status: None   Collection Time: 05/09/20 11:19 PM   Specimen: Nasopharyngeal Swab  Result Value Ref Range Status   SARS Coronavirus 2 NEGATIVE NEGATIVE Final    Comment: (NOTE) SARS-CoV-2 target nucleic acids are NOT DETECTED. The SARS-CoV-2 RNA is generally detectable in upper and lower respiratory specimens during the acute phase of infection. The lowest concentration of SARS-CoV-2 viral copies this assay can detect is 250 copies / mL. A negative result does not preclude SARS-CoV-2 infection and should not be used as the sole basis for treatment or other patient management decisions.  A negative result may occur with improper specimen collection / handling, submission of specimen other than nasopharyngeal swab, presence of viral mutation(s) within the areas targeted by this assay, and inadequate number of viral copies (<250 copies / mL). A negative result must be combined with clinical observations, patient history, and epidemiological information. Fact Sheet for Patients:   StrictlyIdeas.no Fact Sheet for Healthcare Providers: BankingDealers.co.za This test is  not yet approved or cleared  by the [place] FDA and has been authorized for detection and/or diagnosis of SARS-CoV-2 by FDA under an Emergency Use Authorization (EUA).  This EUA will remain in effect (meaning this test can be used) for the duration of the COVID-19 declaration under Section 564(b)(1) of the Act, 21 U.S.C. section 360bbb-3(b)(1), unless the authorization is terminated or revoked sooner. Performed at [facility], [place] [address] [place], [place] [address]      Studies: No results found.    Flora Lipps, MD  [facility] 05/10/2020

## 2020-05-10 NOTE — Progress Notes (Signed)
And trace was admitted 05/09/2020 with platelet count of 7000.  She was started on IVIG and steroids.  She appears to be responding.  We are going to add rituximab when she sees me 05/13/2020 and we will set her up for a steroid taper at that time

## 2020-05-10 NOTE — Telephone Encounter (Signed)
Scheduled appt per 5/11 sch message - pt is aware of appt date and time   

## 2020-05-11 LAB — CBC WITH DIFFERENTIAL/PLATELET
Abs Immature Granulocytes: 0.1 10*3/uL — ABNORMAL HIGH (ref 0.00–0.07)
Basophils Absolute: 0 10*3/uL (ref 0.0–0.1)
Basophils Relative: 0 %
Eosinophils Absolute: 0 10*3/uL (ref 0.0–0.5)
Eosinophils Relative: 0 %
HCT: 35.1 % — ABNORMAL LOW (ref 36.0–46.0)
Hemoglobin: 11.4 g/dL — ABNORMAL LOW (ref 12.0–15.0)
Immature Granulocytes: 1 %
Lymphocytes Relative: 6 %
Lymphs Abs: 0.8 10*3/uL (ref 0.7–4.0)
MCH: 27.7 pg (ref 26.0–34.0)
MCHC: 32.5 g/dL (ref 30.0–36.0)
MCV: 85.2 fL (ref 80.0–100.0)
Monocytes Absolute: 0.4 10*3/uL (ref 0.1–1.0)
Monocytes Relative: 3 %
Neutro Abs: 11.8 10*3/uL — ABNORMAL HIGH (ref 1.7–7.7)
Neutrophils Relative %: 90 %
Platelets: 95 10*3/uL — ABNORMAL LOW (ref 150–400)
RBC: 4.12 MIL/uL (ref 3.87–5.11)
RDW: 14.4 % (ref 11.5–15.5)
WBC: 13.1 10*3/uL — ABNORMAL HIGH (ref 4.0–10.5)
nRBC: 0 % (ref 0.0–0.2)

## 2020-05-11 LAB — BASIC METABOLIC PANEL
Anion gap: 7 (ref 5–15)
BUN: 19 mg/dL (ref 6–20)
CO2: 25 mmol/L (ref 22–32)
Calcium: 9.2 mg/dL (ref 8.9–10.3)
Chloride: 104 mmol/L (ref 98–111)
Creatinine, Ser: 0.8 mg/dL (ref 0.44–1.00)
GFR calc Af Amer: >60 mL/min (ref >60)
GFR calc non Af Amer: >60 mL/min (ref >60)
Glucose, Bld: 172 mg/dL — ABNORMAL HIGH (ref 70–99)
Potassium: 4.1 mmol/L (ref 3.5–5.1)
Sodium: 136 mmol/L (ref 135–145)

## 2020-05-11 LAB — MAGNESIUM: Magnesium: 2.4 mg/dL (ref 1.7–2.4)

## 2020-05-11 LAB — HIV ANTIBODY (ROUTINE TESTING W REFLEX): HIV Screen 4th Generation wRfx: NONREACTIVE

## 2020-05-11 MED ORDER — DEXAMETHASONE 4 MG PO TABS
8.0000 mg | ORAL_TABLET | Freq: Two times a day (BID) | ORAL | 1 refills | Status: DC
Start: 2020-05-11 — End: 2020-05-20

## 2020-05-11 NOTE — Plan of Care (Signed)

## 2020-05-11 NOTE — Progress Notes (Signed)
Discharge instructions given with stated understanding.  Patient awaiting transportation home at this time 

## 2020-05-11 NOTE — Discharge Instructions (Signed)
Idiopathic Thrombocytopenic Purpura Idiopathic thrombocytopenic purpura (ITP) is a disease in which the body's disease-fighting system (immune system) attacks platelets in the body. Platelets are blood cells that clump together to form clots. Blood clots help stop bleeding in the body. A person with ITP has too few platelets. As a result, it is harder for the blood to clot. A person may bruise and bleed easily, such as bleeding a lot from minor cuts and scrapes. ITP can affect both children and adults. It is usually a short-term (acute) condition in children and a long-term (chronic) condition in adults. What are the causes? The cause of ITP is not known. In some cases, this condition may develop:  After a viral infection.  During pregnancy.  After developing an immune system disorder. What increases the risk? You may be more likely to develop this condition if you:  Are female.  Are 3-85 years old. What are the signs or symptoms? If you have a mild case, you may not have any symptoms. In more serious cases, symptoms may include:  Bruising easily.  Minor injuries, like cuts and scrapes, that bleed for a long time.  Small red or purple dots under your skin (petechiae), especially on your shins.  Blood in the urine or stool (feces).  Nosebleeds.  Bleeding gums.  Heavy menstrual periods in women. How is this diagnosed? This condition may be diagnosed based on:  Your symptoms and medical history.  A physical exam.  Blood tests.  Tests of the spongy tissue inside your bones (bone marrow). How is this treated? Treatment depends on how severe your condition is. Treatment may include:  Monitoring your symptoms and your platelet count over time. You may need to see your health care provider for blood tests on a regular basis.  Receiving donated blood products (transfusions), such as platelets.  Medicines to: ? Reduce inflammation (steroids). ? Increase how many platelets  your body makes. ? Reduce the activity of your immune system.  Surgery to remove your spleen, if other treatments are not effective. The spleen is an organ in your upper left abdomen. It stores blood cells and is involved in some immune system functions. In ITP, the spleen releases proteins (antibodies) that mistakenly attack platelets. Follow these instructions at home: Medicines   Take over-the-counter and prescription medicines only as told by your health care provider. Do not take the following unless your health care provider approves: ? Over-the-counter medicines that contain aspirin. ? NSAIDs such as ibuprofen and naproxen.  Talk with your health care provider before you take any new medicines. Certain medicines may increase your risk for dangerous bleeding. Preventing falls   Follow instructions from your health care provider about ways that you can help prevent falls and injuries at home. These may include: ? Removing loose rugs, cords, and other tripping hazards from walkways. ? Installing grab bars in bathrooms. ? Using night-lights. General instructions  Tell all your health care providers, including your dentist, that you have a bleeding disorder. Make sure to tell providers before you have any procedure done, including dental cleanings.  Do not play contact sports or do activities that have a high risk for injury or bruising. Ask your health care provider what activities are safe for you.  Brush your teeth using a soft toothbrush.  When shaving, use an electric razor instead of a blade.  Wear a medical alert bracelet that says that you have a bleeding disorder. This can help you get the treatment you need  in case of emergency.  Keep all follow-up visits as told by your health care provider. This is important. You may need regular blood tests. Contact a health care provider if you have:  New symptoms.  Symptoms that get worse.  A fever. Get help right away if you  have:  A sudden, severe headache.  Sudden, severe nausea.  Severe bleeding.  Vomiting. Summary  Idiopathic thrombocytopenic purpura (ITP) is a disease in which the body's disease-fighting system (immune system) attacks blood cells that form clots to help stop bleeding in the body (platelets).  ITP can lead to bruising and bleeding easily, including frequent nosebleeds, blood in the urine or stool, and bleeding gums. In women, ITP can lead to heavy menstrual periods.  Treatment depends on how severe your condition is. It may include steroid therapy and medicines that reduce the activity of the immune system. In some cases, surgery is needed to remove the spleen.  Follow your health care provider's instructions about taking medicines, preventing falls, restricting some activities, and when to get help. This information is not intended to replace advice given to you by your health care provider. Make sure you discuss any questions you have with your health care provider. Document Revised: 02/09/2019 Document Reviewed: 12/28/2017 Elsevier Patient Education  2020 [employer].

## 2020-05-11 NOTE — Discharge Summary (Signed)
Physician Discharge Summary  Bailey Sheppard [id number] DOB: [birth year] DOA: 05/09/2020  PCP: Leamon Arnt, MD  Admit date: 05/09/2020 Discharge date: 05/11/2020  Time spent:   40 minutes  Recommendations for Outpatient Follow-up:  1. Follow-up with Dr. 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  Patient was hospitalized for first time in June 2014 and received Decadron, 2 doses of IVIG and then Decadron taper with outpatient Rituxan.  Patient was in remission since that time.  But this week and she developed bruises on her legs.  Also noted blood on toothbrush and finally petechiae on her arms.  Patient came to ED was found to have platelet count of 7000.  Oncology was consulted.  Dr.  Jana Hakim saw the patient in the hospital.  Hospital Course:   Recurrent ITP-patient received 2 doses of IVIG in the hospital along with IV Decadron.  Platelet count has improved to 95,000 today.  Patient will be discharged home today and she will follow up with Dr. Jana Hakim as outpatient for Decadron taper as well as outpatient consideration for rituximab.  I called and discussed Dr. Jana Hakim and he recommends sending her home on Decadron 8 mg p.o. twice daily.  Patient already has an appointment to see him on 05/13/2020.  Hypertension-blood pressure is stable ,continue Cardizem, HCTZ.   Procedures:    Consultations:  Oncology  Discharge Exam: Vitals:   05/11/20 0004 05/11/20 0356  BP: 132/77 128/74  Pulse: 64 63  Resp: 16 16  Temp: 97.8 F (36.6 C) 98 F (36.7 C)  SpO2: 94% 94%    General: Appears in no acute distress Cardiovascular:  S1-S2, regular Respiratory: Clear to auscultation bilaterally  Discharge Instructions   Discharge Instructions    Diet - low sodium heart healthy   Complete by: As directed    Increase activity slowly   Complete by: As directed      Allergies as of 05/11/2020      Reactions   Nsaids    ITP      Medication List    TAKE these medications   acetaminophen 500 MG tablet Commonly known as: TYLENOL Take 500 mg by mouth every 6 (six) hours as needed for headache.   dexamethasone 4 MG tablet Commonly known as: DECADRON Take 2 tablets (8 mg total) by mouth 2 (two) times daily.   diltiazem 240 MG 24 hr capsule Commonly known as: CARDIZEM CD Take 1 capsule (240 mg total) by mouth daily.   hydrochlorothiazide 25 MG tablet Commonly known as: HYDRODIURIL Take 1 tablet (25 mg total) by mouth daily.   levonorgestrel 20 MCG/24HR IUD Commonly known as: MIRENA 1 each by Intrauterine route once.   MELATONIN GUMMIES PO Take 1 each by mouth at bedtime as needed (sleep).   multivitamin with minerals Tabs tablet Take 1 tablet by mouth daily.   traMADol 50 MG tablet Commonly known as: ULTRAM Take 50 mg by mouth every 6 (six) hours as needed for moderate pain.      Allergies  Allergen Reactions  . Nsaids     ITP      The results of significant diagnostics from this hospitalization (including imaging, microbiology, ancillary and laboratory)  are listed below for reference.    Significant Diagnostic Studies: No results found.  Microbiology: Recent Results (from the past 240 hour(s))  SARS Coronavirus 2 by RT PCR (hospital order, performed in [facility] hospital lab) Nasopharyngeal Nasopharyngeal Swab     Status: None   Collection Time: 05/09/20 11:19 PM   Specimen: Nasopharyngeal Swab  Result Value Ref Range Status   SARS Coronavirus 2 NEGATIVE NEGATIVE Final    Comment: (NOTE) SARS-CoV-2 target nucleic acids are NOT DETECTED. The SARS-CoV-2 RNA is generally detectable  in upper and lower respiratory specimens during the acute phase of infection. The lowest concentration of SARS-CoV-2 viral copies this assay can detect is 250 copies / mL. A negative result does not preclude SARS-CoV-2 infection and should not be used as the sole basis for treatment or other patient management decisions.  A negative result may occur with improper specimen collection / handling, submission of specimen other than nasopharyngeal swab, presence of viral mutation(s) within the areas targeted by this assay, and inadequate number of viral copies (<250 copies / mL). A negative result must be combined with clinical observations, patient history, and epidemiological information. Fact Sheet for Patients:   StrictlyIdeas.no Fact Sheet for Healthcare Providers: BankingDealers.co.za This test is not yet approved or cleared  by the [place] FDA and has been authorized for detection and/or diagnosis of SARS-CoV-2 by FDA under an Emergency Use Authorization (EUA).  This EUA will remain in effect (meaning this test can be used) for the duration of the COVID-19 declaration under Section 564(b)(1) of the Act, 21 U.S.C. section 360bbb-3(b)(1), unless the authorization is terminated or revoked sooner. Performed at [facility], [place] [address], [place], [place] [address]      Labs: Basic Metabolic Panel: Recent Labs  Lab 05/09/20 2004 05/10/20 0546 05/11/20 0325  NA 139 136 136  K 3.5 3.7 4.1  CL 101 104 104  CO2 26 25 25   GLUCOSE 115* 171* 172*  BUN 13 14 19   CREATININE 0.97 0.79 0.80  CALCIUM 9.1 9.1 9.2  MG  --   --  2.4   Liver Function Tests: No results for input(s): AST, ALT, ALKPHOS, BILITOT, PROT, ALBUMIN in the last 168 hours. No results for input(s): LIPASE, AMYLASE in the last 168 hours. No results for input(s): AMMONIA in the last 168 hours. CBC: Recent Labs  Lab 05/09/20 1420 05/09/20 2004  05/10/20 0546 05/11/20 0325  WBC 7.6 7.7 9.0 13.1*  NEUTROABS 5.6 5.4  --  11.8*  HGB 12.9 13.5 12.2 11.4*  HCT 38.5 40.2 36.5 35.1*  MCV 83.0 82.9 82.4 85.2  PLT 7.0 Repeated and verified X2.* 7* 22* 95*       Signed:  Oswald Hillock MD.  [facility] 05/11/2020, 11:04 AM

## 2020-05-12 ENCOUNTER — Other Ambulatory Visit: Payer: Self-pay | Admitting: *Deleted

## 2020-05-12 DIAGNOSIS — D693 Immune thrombocytopenic purpura: Secondary | ICD-10-CM

## 2020-05-12 NOTE — Progress Notes (Addendum)
[place]  [phone] [phone]     ID: Bailey Sheppard DOB: 1981/09/22  MR#: [id number]  BEE#:[id number]  Patient Care Team: Leamon Arnt, MD as PCP - General (Family Medicine) Jaycelynn Knickerbocker, Virgie Dad, MD as Consulting Physician (Hematology and Oncology) Regina Eck, CNM as Consulting Physician (Certified Nurse Midwife) Yisroel Ramming, Everardo All, MD as Consulting Physician (Obstetrics and Gynecology) Chauncey Cruel, MD OTHER MD:  CHIEF COMPLAINT: Immune thrombocytopenia  CURRENT TREATMENT: Steroids, [IVIG]; rituximab   INTERVAL HISTORY: Bailey Sheppard returns today for follow up of her immune thrombocytopenia.   I met with Bailey Sheppard in the emergency room 05/10/2020.  At that time her platelet count was 7000.  We started her on steroids and IVIG and by the next day her platelets were just over 20,000.  She had a very rapid recovery, received 2 doses of IVIG total, and was discharged on 8 mg of dexamethasone twice daily.  She is here today to receive her first of several weekly right toxin Mab dose and start her steroid taper.   REVIEW OF SYSTEMS: Bailey Sheppard says she has altered taste and a little bit of nausea.  She did not take her blood pressure medicines today and she says she is a little confused with all the medicines she is supposed to be taking.  She did pick up the dexamethasone yesterday but she has not started taking it.  Otherwise she is back home back to work taking care of her kids were very busy in sports and her mother is helping out.  She is still of course grieving the recent death of her husband.  She has had no new bruising or bleeding issues since discharge.   HISTORY OF CURRENT ILLNESS: From the May 2021 intake note:  I last saw Bailey Sheppard in 2019.  At that time she continued in remission from her ITP. She has been stable off treatment until this weekend, 05/07/2020, when she developed a bruise on her leg. She then had blood  on her toothbrush. Finally she developed petechiae and "that's when I started paying attention," as she has had them before. She called her primary MD Dr Jonni Sanger who checked a CBC showing a platelet count of <10K with otherwise normal counts. The patient called our office and was directed to the ED.  Going back 2 weeks she is not aware of any intercurrent infections or change in medications.  Her 92 year old son is a Associate Professor and they have traveled to [place] in [place] in the last 2 weekends.  They will had some sinus issues but no fever or symptoms of flu.  She has not received a coronavirus vaccine.  The patient's subsequent history is as detailed below.   PAST MEDICAL HISTORY: Past Medical History:  Diagnosis Date  . Acute pericarditis, unspecified 06/11/2013  . Anemia   . Anxiety   . Chronic hypertension during pregnancy 09/17/2018  . Depression   . Elevated hemoglobin A1c 2018   5.7  . Fibroids   . Hypertension   . ITP (idiopathic thrombocytopenic purpura)   . Obesity   . Vaginal Pap smear, abnormal     PAST SURGICAL HISTORY: Past Surgical History:  Procedure Laterality Date  . INTRAUTERINE DEVICE (IUD) INSERTION  04/2012  . UMBILICAL HERNIA REPAIR      FAMILY HISTORY Family History  Problem Relation Age of Onset  . Hypertension Mother   . Diabetes Father   . Kidney failure Father   . Heart failure  Father   . Diabetes Brother   . Lupus Cousin   . Diabetes Maternal Grandfather   . Kidney failure Maternal Grandfather   . Diabetes Paternal Grandmother     GYNECOLOGIC HISTORY:  G3 P3, Menarche age 70. Age of first live birth: 23.    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  Her husband was a Software engineer at a private school but passed away suddenly, at home, from a presumed myocardial infarction, February 2021.  (His family has a strong history of heart attacks and strokes).. Their  3 sons  are currently 13, 65, and less than 74 years old    ADVANCED DIRECTIVES: To be discussed   HEALTH MAINTENANCE: Social History   Tobacco Use  . Smoking status: Never Smoker  . Smokeless tobacco: Never Used  Substance Use Topics  . Alcohol use: No  . Drug use: No      Allergies  Allergen Reactions  . Nsaids     ITP    Current Outpatient Medications  Medication Sig Dispense Refill  . acetaminophen (TYLENOL) 500 MG tablet Take 500 mg by mouth every 6 (six) hours as needed for headache.    . dexamethasone (DECADRON) 4 MG tablet Take 2 tablets (8 mg total) by mouth 2 (two) times daily. 60 tablet 1  . diltiazem (CARDIZEM CD) 240 MG 24 hr capsule Take 1 capsule (240 mg total) by mouth daily. 90 capsule 3  . hydrochlorothiazide (HYDRODIURIL) 25 MG tablet Take 1 tablet (25 mg total) by mouth daily. 90 tablet 3  . levonorgestrel (MIRENA) 20 MCG/24HR IUD 1 each by Intrauterine route once.    Marland Kitchen MELATONIN GUMMIES PO Take 1 each by mouth at bedtime as needed (sleep).    . Multiple Vitamin (MULTIVITAMIN WITH MINERALS) TABS tablet Take 1 tablet by mouth daily.    . traMADol (ULTRAM) 50 MG tablet Take 50 mg by mouth every 6 (six) hours as needed for moderate pain.     No current facility-administered medications for this visit.    OBJECTIVE: African-American woman in no acute distress  Vitals:   05/13/20 0818  BP: (!) 150/109  Pulse: 61  Resp: 20  Temp: 98.3 F (36.8 C)  SpO2: 98%     Body mass index is 42.64 kg/m.   Wt Readings from Last 3 Encounters:  05/13/20 297 lb 3.2 oz (134.8 kg)  05/09/20 290 lb (131.5 kg)  05/05/20 295 lb 3.2 oz (133.9 kg)      ECOG FS:1 - Symptomatic but completely ambulatory  Sclerae unicteric, EOMs intact Wearing a mask No cervical or supraclavicular adenopathy Lungs no rales or rhonchi Heart regular rate and rhythm Abd soft, obese, nontender, positive bowel sounds MSK no focal spinal tenderness, no upper extremity lymphedema Neuro:  nonfocal, well oriented, appropriate affect   LAB RESULTS:  CMP     Component Value Date/Time   NA 136 05/11/2020 0325   NA 140 09/13/2014 1400   K 4.1 05/11/2020 0325   K 3.7 09/13/2014 1400   CL 104 05/11/2020 0325   CL 105 06/19/2013 0845   CO2 25 05/11/2020 0325   CO2 24 09/13/2014 1400   GLUCOSE 172 (H) 05/11/2020 0325   GLUCOSE 110 09/13/2014 1400   GLUCOSE 97 06/19/2013 0845   BUN 19 05/11/2020 0325   BUN 9.7 09/13/2014 1400   CREATININE 0.80 05/11/2020 0325   CREATININE 0.76 03/06/2019 1440   CREATININE 0.8 09/13/2014 1400  CALCIUM 9.2 05/11/2020 0325   CALCIUM 9.1 09/13/2014 1400   PROT 6.6 02/23/2020 1448   PROT 7.1 09/13/2014 1400   ALBUMIN 4.6 02/23/2020 1448   ALBUMIN 4.0 09/13/2014 1400   AST 16 02/23/2020 1448   AST 13 (L) 12/02/2018 1147   AST 14 09/13/2014 1400   ALT 25 02/23/2020 1448   ALT 20 12/02/2018 1147   ALT 13 09/13/2014 1400   ALKPHOS 79 02/23/2020 1448   ALKPHOS 53 09/13/2014 1400   BILITOT 0.9 02/23/2020 1448   BILITOT 1.3 (H) 12/02/2018 1147   BILITOT 0.95 09/13/2014 1400   GFRNONAA >60 05/11/2020 0325   GFRNONAA >60 12/02/2018 1147   GFRAA >60 05/11/2020 0325   GFRAA >60 12/02/2018 1147    No results found for: TOTALPROTELP, ALBUMINELP, A1GS, A2GS, BETS, BETA2SER, GAMS, MSPIKE, SPEI  No results found for: KPAFRELGTCHN, LAMBDASER, KAPLAMBRATIO  Lab Results  Component Value Date   WBC 9.6 05/13/2020   NEUTROABS 5.8 05/13/2020   HGB 12.0 05/13/2020   HCT 35.8 (L) 05/13/2020   MCV 81.7 05/13/2020   PLT 232 05/13/2020   No results found for: LABCA2  No components found for: [id number]  No results for input(s): INR in the last 168 hours.  No results found for: LABCA2  No results found for: TTS177  No results found for: LTJ030  No results found for: SPQ330  No results found for: CA2729  No components found for: HGQUANT  No results found for: CEA1 / No results found for: CEA1   No results found for: AFPTUMOR  No  results found for: [facility]  Appointment on 05/13/2020  Component Date Value Ref Range Status  . WBC Count 05/13/2020 9.6  4.0 - 10.5 K/uL Final  . RBC 05/13/2020 4.38  3.87 - 5.11 MIL/uL Final  . Hemoglobin 05/13/2020 12.0  12.0 - 15.0 g/dL Final  . HCT 05/13/2020 35.8* 36.0 - 46.0 % Final  . MCV 05/13/2020 81.7  80.0 - 100.0 fL Final  . MCH 05/13/2020 27.4  26.0 - 34.0 pg Final  . MCHC 05/13/2020 33.5  30.0 - 36.0 g/dL Final  . RDW 05/13/2020 14.6  11.5 - 15.5 % Final  . Platelet Count 05/13/2020 232  150 - 400 K/uL Final  . nRBC 05/13/2020 0.2  0.0 - 0.2 % Final  . Neutrophils Relative % 05/13/2020 61  % Final  . Neutro Abs 05/13/2020 5.8  1.7 - 7.7 K/uL Final  . Lymphocytes Relative 05/13/2020 26  % Final  . Lymphs Abs 05/13/2020 2.5  0.7 - 4.0 K/uL Final  . Monocytes Relative 05/13/2020 9  % Final  . Monocytes Absolute 05/13/2020 0.9  0.1 - 1.0 K/uL Final  . Eosinophils Relative 05/13/2020 0  % Final  . Eosinophils Absolute 05/13/2020 0.0  0.0 - 0.5 K/uL Final  . Basophils Relative 05/13/2020 0  % Final  . Basophils Absolute 05/13/2020 0.0  0.0 - 0.1 K/uL Final  . Immature Granulocytes 05/13/2020 4  % Final  . Abs Immature Granulocytes 05/13/2020 0.33* 0.00 - 0.07 K/uL Final   Performed at [facility], [place] [address], [place], [place] [address]  Admission on 05/09/2020, Discharged on 05/11/2020  Component Date Value Ref Range Status  . WBC 05/09/2020 7.7  4.0 - 10.5 K/uL Final  . RBC 05/09/2020 4.85  3.87 - 5.11 MIL/uL Final  . Hemoglobin 05/09/2020 13.5  12.0 - 15.0 g/dL Final  . HCT 05/09/2020 40.2  36.0 - 46.0 % Final  . MCV  05/09/2020 82.9  80.0 - 100.0 fL Final  . MCH 05/09/2020 27.8  26.0 - 34.0 pg Final  . MCHC 05/09/2020 33.6  30.0 - 36.0 g/dL Final  . RDW 05/09/2020 14.3  11.5 - 15.5 % Final  . Platelets 05/09/2020 7* 150 - 400 K/uL Final   Comment: SPECIMEN CHECKED FOR CLOTS Immature Platelet Fraction may be clinically indicated,  consider ordering this additional test [id number] PLATELET COUNT CONFIRMED BY SMEAR REPEATED TO VERIFY   . nRBC 05/09/2020 0.0  0.0 - 0.2 % Final  . Neutrophils Relative % 05/09/2020 70  % Final  . Neutro Abs 05/09/2020 5.4  1.7 - 7.7 K/uL Final  . Lymphocytes Relative 05/09/2020 17  % Final  . Lymphs Abs 05/09/2020 1.3  0.7 - 4.0 K/uL Final  . Monocytes Relative 05/09/2020 9  % Final  . Monocytes Absolute 05/09/2020 0.7  0.1 - 1.0 K/uL Final  . Eosinophils Relative 05/09/2020 3  % Final  . Eosinophils Absolute 05/09/2020 0.2  0.0 - 0.5 K/uL Final  . Basophils Relative 05/09/2020 0  % Final  . Basophils Absolute 05/09/2020 0.0  0.0 - 0.1 K/uL Final  . Immature Granulocytes 05/09/2020 1  % Final  . Abs Immature Granulocytes 05/09/2020 0.06  0.00 - 0.07 K/uL Final   Performed at [facility], [place] [address] [place], [place] [address]  . Sodium 05/09/2020 139  135 - 145 mmol/L Final  . Potassium 05/09/2020 3.5  3.5 - 5.1 mmol/L Final  . Chloride 05/09/2020 101  98 - 111 mmol/L Final  . CO2 05/09/2020 26  22 - 32 mmol/L Final  . Glucose, Bld 05/09/2020 115* 70 - 99 mg/dL Final   Glucose reference range applies only to samples taken after fasting for at least 8 hours.  . BUN 05/09/2020 13  6 - 20 mg/dL Final  . Creatinine, Ser 05/09/2020 0.97  0.44 - 1.00 mg/dL Final  . Calcium 05/09/2020 9.1  8.9 - 10.3 mg/dL Final  . GFR calc non Af Amer 05/09/2020 >60  >60 mL/min Final  . GFR calc Af Amer 05/09/2020 >60  >60 mL/min Final  . Anion gap 05/09/2020 12  5 - 15 Final   Performed at [facility], [place] [address] [place], [place] [address]  . HIV Screen 4th Generation wRfx 05/10/2020 Non Reactive  Non Reactive Final   Comment: (NOTE) Performed At: [facility] [place], [place] [id number] Rush Farmer MD PX:[id number]   . WBC 05/10/2020 9.0  4.0 - 10.5 K/uL Final  . RBC 05/10/2020 4.43  3.87 - 5.11 MIL/uL Final  . Hemoglobin  05/10/2020 12.2  12.0 - 15.0 g/dL Final  . HCT 05/10/2020 36.5  36.0 - 46.0 % Final  . MCV 05/10/2020 82.4  80.0 - 100.0 fL Final  . MCH 05/10/2020 27.5  26.0 - 34.0 pg Final  . MCHC 05/10/2020 33.4  30.0 - 36.0 g/dL Final  . RDW 05/10/2020 14.2  11.5 - 15.5 % Final  . Platelets 05/10/2020 22* 150 - 400 K/uL Final   Comment: CRITICAL VALUE NOTED.  VALUE IS CONSISTENT WITH PREVIOUSLY REPORTED AND CALLED VALUE. Immature Platelet Fraction may be clinically indicated, consider ordering this additional test [id number] REPEATED TO VERIFY   . nRBC 05/10/2020 0.0  0.0 - 0.2 % Final   Performed at [place] [address] [place],  [address]  . Sodium 05/10/2020 136  135 - 145 mmol/L Final  . Potassium 05/10/2020 3.7  3.5 - 5.1 mmol/L Final  . Chloride 05/10/2020 104  98 - 111 mmol/L Final  . CO2 05/10/2020 25  22 - 32 mmol/L Final  . Glucose, Bld 05/10/2020 171* 70 - 99 mg/dL Final   Glucose reference range applies only to samples taken after fasting for at least 8 hours.  . BUN 05/10/2020 14  6 - 20 mg/dL Final  . Creatinine, Ser 05/10/2020 0.79  0.44 - 1.00 mg/dL Final  . Calcium 05/10/2020 9.1  8.9 - 10.3 mg/dL Final  . GFR calc non Af Amer 05/10/2020 >60  >60 mL/min Final  . GFR calc Af Amer 05/10/2020 >60  >60 mL/min Final  . Anion gap 05/10/2020 7  5 - 15 Final   Performed at [facility], [place] [address] [place], [place] [address]  . SARS Coronavirus 2 05/09/2020 NEGATIVE  NEGATIVE Final   Comment: (NOTE) SARS-CoV-2 target nucleic acids are NOT DETECTED. The SARS-CoV-2 RNA is generally detectable in upper and lower respiratory specimens during the acute phase of infection. The lowest concentration of SARS-CoV-2 viral copies this assay can detect is 250 copies / mL. A negative result does not preclude SARS-CoV-2 infection and should not be used as the sole basis for treatment or other patient management decisions.  A negative result  may occur with improper specimen collection / handling, submission of specimen other than nasopharyngeal swab, presence of viral mutation(s) within the areas targeted by this assay, and inadequate number of viral copies (<250 copies / mL). A negative result must be combined with clinical observations, patient history, and epidemiological information. Fact Sheet for Patients:   StrictlyIdeas.no Fact Sheet for Healthcare Providers: BankingDealers.co.za This test is not yet approved or cleared                           by the [place] FDA and has been authorized for detection and/or diagnosis of SARS-CoV-2 by FDA under an Emergency Use Authorization (EUA).  This EUA will remain in effect (meaning this test can be used) for the duration of the COVID-19 declaration under Section 564(b)(1) of the Act, 21 U.S.C. section 360bbb-3(b)(1), unless the authorization is terminated or revoked sooner. Performed at [facility], [place] [address] [place], [place] [address]   . WBC 05/11/2020 13.1* 4.0 - 10.5 K/uL Final  . RBC 05/11/2020 4.12  3.87 - 5.11 MIL/uL Final  . Hemoglobin 05/11/2020 11.4* 12.0 - 15.0 g/dL Final  . HCT 05/11/2020 35.1* 36.0 - 46.0 % Final  . MCV 05/11/2020 85.2  80.0 - 100.0 fL Final  . MCH 05/11/2020 27.7  26.0 - 34.0 pg Final  . MCHC 05/11/2020 32.5  30.0 - 36.0 g/dL Final  . RDW 05/11/2020 14.4  11.5 - 15.5 % Final  . Platelets 05/11/2020 95* 150 - 400 K/uL Final   Comment: SPECIMEN CHECKED FOR CLOTS DELTA CHECK NOTED Immature Platelet Fraction may be clinically indicated, consider ordering this additional test [id number] REPEATED TO VERIFY   . nRBC 05/11/2020 0.0  0.0 - 0.2 % Final  . Neutrophils Relative % 05/11/2020 90  % Final  . Neutro Abs 05/11/2020 11.8* 1.7 - 7.7 K/uL Final  . Lymphocytes Relative 05/11/2020 6  % Final  . Lymphs Abs 05/11/2020 0.8  0.7 - 4.0 K/uL Final  . Monocytes Relative  05/11/2020 3  % Final  . Monocytes Absolute 05/11/2020 0.4  0.1 - 1.0 K/uL Final  . Eosinophils Relative 05/11/2020 0  %  Final  . Eosinophils Absolute 05/11/2020 0.0  0.0 - 0.5 K/uL Final  . Basophils Relative 05/11/2020 0  % Final  . Basophils Absolute 05/11/2020 0.0  0.0 - 0.1 K/uL Final  . Immature Granulocytes 05/11/2020 1  % Final  . Abs Immature Granulocytes 05/11/2020 0.10* 0.00 - 0.07 K/uL Final   Performed at [facility], [place] [address], [place], [place] [address]  . Sodium 05/11/2020 136  135 - 145 mmol/L Final  . Potassium 05/11/2020 4.1  3.5 - 5.1 mmol/L Final  . Chloride 05/11/2020 104  98 - 111 mmol/L Final  . CO2 05/11/2020 25  22 - 32 mmol/L Final  . Glucose, Bld 05/11/2020 172* 70 - 99 mg/dL Final   Glucose reference range applies only to samples taken after fasting for at least 8 hours.  . BUN 05/11/2020 19  6 - 20 mg/dL Final  . Creatinine, Ser 05/11/2020 0.80  0.44 - 1.00 mg/dL Final  . Calcium 05/11/2020 9.2  8.9 - 10.3 mg/dL Final  . GFR calc non Af Amer 05/11/2020 >60  >60 mL/min Final  . GFR calc Af Amer 05/11/2020 >60  >60 mL/min Final  . Anion gap 05/11/2020 7  5 - 15 Final   Performed at [facility], [place] [address], [place], [place] [address]  . Magnesium 05/11/2020 2.4  1.7 - 2.4 mg/dL Final   Performed at [place] [address] [place], [place] [address]    (this displays the last labs from the last 3 days)  No results found for: HGBA, HGBA2QUANT, HGBFQUANT, HGBSQUAN (Hemoglobinopathy evaluation)   Lab Results  Component Value Date   LDH 159 07/14/2018    Lab Results  Component Value Date   IRON 49 04/25/2012   TIBC 391 04/25/2012   IRONPCTSAT 13 (L) 04/25/2012   (Iron and TIBC)  Lab Results  Component Value Date   FERRITIN 29 12/02/2018    Urinalysis    Component Value Date/Time   COLORURINE YELLOW 01/21/2014 2018   APPEARANCEUR CLEAR 01/21/2014 2018   LABSPEC 1.022  01/21/2014 2018   LABSPEC 1.005 06/19/2013 1127   PHURINE 6.0 01/21/2014 2018   GLUCOSEU NEGATIVE 01/21/2014 2018   GLUCOSEU Negative 06/19/2013 [place] 01/21/2014 2018   [place] NEGATIVE 01/21/2014 2018   BILIRUBINUR Negative 06/19/2013 [place] 01/21/2014 2018   PROTEINUR NEGATIVE 01/21/2014 2018   UROBILINOGEN 0.2 01/21/2014 2018   UROBILINOGEN 0.2 06/19/2013 1127   NITRITE NEGATIVE 01/21/2014 2018   LEUKOCYTESUR NEGATIVE 01/21/2014 2018   LEUKOCYTESUR [place] 06/19/2013 1127    STUDIES: No results found.   ELIGIBLE FOR AVAILABLE RESEARCH PROTOCOL: no  ASSESSMENT: 39 y.o. [place] woman:  1.  ITP, requiring hospitalization in June 2014. Patient was treated with IV dexamethasone and IVIG. She was then on a dexamethasone taper between 06/22/2013 and 07/12/2013. She received 6 weekly doses of rituximab, followed by 2 q. three-week doses, with the final dose given on 08/21/2013.  2. History of iron deficiency anemia,             (a) IV Feraheme was given in March 2013.             (b) received repeat IV Feraheme on 07/17/2018 and 07/24/2018 for a ferritin less than 6  3. Acute pericarditis - resolved   4.  Pregnancy complicated by iron deficiency and thrombocytopenia: Resolved  5.  Recurrent ITP 05/09/2020: platelet count 7000  (a) dexamethasone  started 05/09/2020  (b) IVIG given 05/09/2020 and 05/10/2020  (c) rituximab started 05/13/2020   (i) negative hepatitis B surface antigen August 2018 and core antibody June 2014   PLAN: Sh[place] had a very quick response to steroids and IVIG.  This is favorable.  Her platelet count currently is normal.  She will be on dexamethasone 8 mg twice daily with food for 4 days, then 4 mg twice daily with food for 4 days, then 4 mg with breakfast only for 4 days, then 2 mg for 4 days after which she may either go off or go to every other day depending on how she is doing.  We are  starting weekly rituximab today.  She has received this in the past with good tolerance but nevertheless we again discussed the possible toxicities side effects and complications of this agent.  We will be checking her platelet count on a weekly basis.  The effects of her IVIG likely will wear off between 2 and 4 weeks from now so if her platelet count remains normal after 6-8 weekly doses of rituximab and after she has been off steroids at least 2 weeks we can start broadening out the dosing interval.  Her blood pressure was quite high today.  She did not take her blood pressure medicine this morning but she had it with her and she will be taking it immediately.  We are going to see her again next week and then every other week until the rituximab treatments have been completed.  Chauncey Cruel, MD   05/13/2020 8:36 AM Medical Oncology and Hematology [facility] [place], [place] [address] Tel. [phone]    Fax. [phone]   I, Wilburn Mylar, am acting as scribe for Dr. Virgie Dad. Bailey Sheppard.  I, Lurline Del MD, have reviewed the above documentation for accuracy and completeness, and I agree with the above.    *Total Encounter Time as defined by the Centers for Medicare and Medicaid Services includes, in addition to the face-to-face time of a patient visit (documented in the note above) non-face-to-face time: obtaining and reviewing outside history, ordering and reviewing medications, tests or procedures, care coordination (communications with other health care professionals or caregivers) and documentation in the medical record.

## 2020-05-13 ENCOUNTER — Inpatient Hospital Stay: Payer: No Typology Code available for payment source | Attending: Oncology

## 2020-05-13 ENCOUNTER — Inpatient Hospital Stay (HOSPITAL_BASED_OUTPATIENT_CLINIC_OR_DEPARTMENT_OTHER): Payer: No Typology Code available for payment source | Admitting: Oncology

## 2020-05-13 ENCOUNTER — Other Ambulatory Visit: Payer: Self-pay

## 2020-05-13 ENCOUNTER — Telehealth: Payer: Self-pay

## 2020-05-13 ENCOUNTER — Inpatient Hospital Stay: Payer: No Typology Code available for payment source

## 2020-05-13 VITALS — BP 119/68 | HR 65 | Temp 98.4°F | Resp 18

## 2020-05-13 VITALS — BP 150/109 | HR 61 | Temp 98.3°F | Resp 20 | Ht 70.0 in | Wt 297.2 lb

## 2020-05-13 DIAGNOSIS — I10 Essential (primary) hypertension: Secondary | ICD-10-CM | POA: Insufficient documentation

## 2020-05-13 DIAGNOSIS — Z6841 Body Mass Index (BMI) 40.0 and over, adult: Secondary | ICD-10-CM

## 2020-05-13 DIAGNOSIS — D693 Immune thrombocytopenic purpura: Secondary | ICD-10-CM | POA: Diagnosis not present

## 2020-05-13 DIAGNOSIS — Z79899 Other long term (current) drug therapy: Secondary | ICD-10-CM | POA: Insufficient documentation

## 2020-05-13 DIAGNOSIS — R439 Unspecified disturbances of smell and taste: Secondary | ICD-10-CM | POA: Diagnosis not present

## 2020-05-13 DIAGNOSIS — K219 Gastro-esophageal reflux disease without esophagitis: Secondary | ICD-10-CM | POA: Insufficient documentation

## 2020-05-13 DIAGNOSIS — R110 Nausea: Secondary | ICD-10-CM | POA: Insufficient documentation

## 2020-05-13 DIAGNOSIS — K2960 Other gastritis without bleeding: Secondary | ICD-10-CM | POA: Insufficient documentation

## 2020-05-13 DIAGNOSIS — T380X5A Adverse effect of glucocorticoids and synthetic analogues, initial encounter: Secondary | ICD-10-CM | POA: Diagnosis not present

## 2020-05-13 DIAGNOSIS — E669 Obesity, unspecified: Secondary | ICD-10-CM | POA: Diagnosis not present

## 2020-05-13 DIAGNOSIS — Z5112 Encounter for antineoplastic immunotherapy: Secondary | ICD-10-CM | POA: Insufficient documentation

## 2020-05-13 DIAGNOSIS — F418 Other specified anxiety disorders: Secondary | ICD-10-CM | POA: Insufficient documentation

## 2020-05-13 DIAGNOSIS — R233 Spontaneous ecchymoses: Secondary | ICD-10-CM | POA: Diagnosis not present

## 2020-05-13 DIAGNOSIS — I309 Acute pericarditis, unspecified: Secondary | ICD-10-CM | POA: Diagnosis not present

## 2020-05-13 DIAGNOSIS — D509 Iron deficiency anemia, unspecified: Secondary | ICD-10-CM | POA: Insufficient documentation

## 2020-05-13 LAB — CMP (CANCER CENTER ONLY)
ALT: 73 U/L — ABNORMAL HIGH (ref 0–44)
AST: 42 U/L — ABNORMAL HIGH (ref 15–41)
Albumin: 3.3 g/dL — ABNORMAL LOW (ref 3.5–5.0)
Alkaline Phosphatase: 62 U/L (ref 38–126)
Anion gap: 10 (ref 5–15)
BUN: 18 mg/dL (ref 6–20)
CO2: 26 mmol/L (ref 22–32)
Calcium: 8.6 mg/dL — ABNORMAL LOW (ref 8.9–10.3)
Chloride: 105 mmol/L (ref 98–111)
Creatinine: 0.88 mg/dL (ref 0.44–1.00)
GFR, Est AFR Am: >60 mL/min (ref >60)
GFR, Estimated: >60 mL/min (ref >60)
Glucose, Bld: 94 mg/dL (ref 70–99)
Potassium: 3.4 mmol/L — ABNORMAL LOW (ref 3.5–5.1)
Sodium: 141 mmol/L (ref 135–145)
Total Bilirubin: 1 mg/dL (ref 0.3–1.2)
Total Protein: 8.6 g/dL — ABNORMAL HIGH (ref 6.5–8.1)

## 2020-05-13 LAB — CBC WITH DIFFERENTIAL (CANCER CENTER ONLY)
Abs Immature Granulocytes: 0.33 10*3/uL — ABNORMAL HIGH (ref 0.00–0.07)
Basophils Absolute: 0 10*3/uL (ref 0.0–0.1)
Basophils Relative: 0 %
Eosinophils Absolute: 0 10*3/uL (ref 0.0–0.5)
Eosinophils Relative: 0 %
HCT: 35.8 % — ABNORMAL LOW (ref 36.0–46.0)
Hemoglobin: 12 g/dL (ref 12.0–15.0)
Immature Granulocytes: 4 %
Lymphocytes Relative: 26 %
Lymphs Abs: 2.5 10*3/uL (ref 0.7–4.0)
MCH: 27.4 pg (ref 26.0–34.0)
MCHC: 33.5 g/dL (ref 30.0–36.0)
MCV: 81.7 fL (ref 80.0–100.0)
Monocytes Absolute: 0.9 10*3/uL (ref 0.1–1.0)
Monocytes Relative: 9 %
Neutro Abs: 5.8 10*3/uL (ref 1.7–7.7)
Neutrophils Relative %: 61 %
Platelet Count: 232 10*3/uL (ref 150–400)
RBC: 4.38 MIL/uL (ref 3.87–5.11)
RDW: 14.6 % (ref 11.5–15.5)
WBC Count: 9.6 10*3/uL (ref 4.0–10.5)
nRBC: 0.2 % (ref 0.0–0.2)

## 2020-05-13 MED ORDER — SODIUM CHLORIDE 0.9 % IV SOLN
Freq: Once | INTRAVENOUS | Status: AC
  Filled 2020-05-13: qty 250

## 2020-05-13 MED ORDER — DIPHENHYDRAMINE HCL 25 MG PO CAPS
ORAL_CAPSULE | ORAL | Status: AC
  Filled 2020-05-13: qty 2

## 2020-05-13 MED ORDER — ACETAMINOPHEN 325 MG PO TABS
650.0000 mg | ORAL_TABLET | Freq: Once | ORAL | Status: AC
  Administered 2020-05-13: 650 mg via ORAL

## 2020-05-13 MED ORDER — ACETAMINOPHEN 325 MG PO TABS
ORAL_TABLET | ORAL | Status: AC
  Filled 2020-05-13: qty 2

## 2020-05-13 MED ORDER — DIPHENHYDRAMINE HCL 25 MG PO CAPS
50.0000 mg | ORAL_CAPSULE | Freq: Once | ORAL | Status: AC
  Administered 2020-05-13: 50 mg via ORAL

## 2020-05-13 MED ORDER — SODIUM CHLORIDE 0.9 % IV SOLN
375.0000 mg/m2 | Freq: Once | INTRAVENOUS | Status: AC
  Administered 2020-05-13: 1000 mg via INTRAVENOUS
  Filled 2020-05-13: qty 100

## 2020-05-13 NOTE — Progress Notes (Signed)
Okay to treat today with elevated BP, per Dr. Jana Hakim. Pt took BP meds during MD appt. This morning. Okay to treat without Hep B panel today, per Dr. Jana Hakim.   First time Rituxan, Pt completed without complaint. VVS. Nutrition provided .Educational information given.

## 2020-05-13 NOTE — Patient Instructions (Signed)
[place] Discharge Instructions for Patients Receiving Chemotherapy  Today you received the following chemotherapy agents rituxan  To help prevent nausea and vomiting after your treatment, we encourage you to take your nausea medication.   If you develop nausea and vomiting that is not controlled by your nausea medication, call the clinic.   BELOW ARE SYMPTOMS THAT SHOULD BE REPORTED IMMEDIATELY:  *FEVER GREATER THAN 100.5 F  *CHILLS WITH OR WITHOUT FEVER  NAUSEA AND VOMITING THAT IS NOT CONTROLLED WITH YOUR NAUSEA MEDICATION  *UNUSUAL SHORTNESS OF BREATH  *UNUSUAL BRUISING OR BLEEDING  TENDERNESS IN MOUTH AND THROAT WITH OR WITHOUT PRESENCE OF ULCERS  *URINARY PROBLEMS  *BOWEL PROBLEMS  UNUSUAL RASH Items with * indicate a potential emergency and should be followed up as soon as possible.  Feel free to call the clinic should you have any questions or concerns. The clinic phone number is [phone].  Please show the [place] at check-in to the Emergency Department and triage nurse.  Rituximab injection What is this medicine? RITUXIMAB (ri TUX i mab) is a monoclonal antibody. It is used to treat certain types of cancer like non-Hodgkin lymphoma and chronic lymphocytic leukemia. It is also used to treat rheumatoid arthritis, granulomatosis with polyangiitis (or Wegener's granulomatosis), microscopic polyangiitis, and pemphigus vulgaris. This medicine may be used for other purposes; ask your health care provider or pharmacist if you have questions. COMMON BRAND NAME(S): Rituxan, RUXIENCE What should I tell my health care provider before I take this medicine? They need to know if you have any of these conditions:  heart disease  infection (especially a virus infection such as hepatitis B, chickenpox, cold sores, or herpes)  immune system problems  irregular heartbeat  kidney disease  low blood counts, like low white cell, platelet, or red  cell counts  lung or breathing disease, like asthma  recently received or scheduled to receive a vaccine  an unusual or allergic reaction to rituximab, other medicines, foods, dyes, or preservatives  pregnant or trying to get pregnant  breast-feeding How should I use this medicine? This medicine is for infusion into a vein. It is administered in a hospital or clinic by a specially trained health care professional. A special MedGuide will be given to you by the pharmacist with each prescription and refill. Be sure to read this information carefully each time. Talk to your pediatrician regarding the use of this medicine in children. This medicine is not approved for use in children. Overdosage: If you think you have taken too much of this medicine contact a poison control center or emergency room at once. NOTE: This medicine is only for you. Do not share this medicine with others. What if I miss a dose? It is important not to miss a dose. Call your doctor or health care professional if you are unable to keep an appointment. What may interact with this medicine?  cisplatin  live virus vaccines This list may not describe all possible interactions. Give your health care provider a list of all the medicines, herbs, non-prescription drugs, or dietary supplements you use. Also tell them if you smoke, drink alcohol, or use illegal drugs. Some items may interact with your medicine. What should I watch for while using this medicine? Your condition will be monitored carefully while you are receiving this medicine. You may need blood work done while you are taking this medicine. This medicine can cause serious allergic reactions. To reduce your risk you may need to  take medicine before treatment with this medicine. Take your medicine as directed. In some patients, this medicine may cause a serious brain infection that may cause death. If you have any problems seeing, thinking, speaking, walking, or  standing, tell your healthcare professional right away. If you cannot reach your healthcare professional, urgently seek other source of medical care. Call your doctor or health care professional for advice if you get a fever, chills or sore throat, or other symptoms of a cold or flu. Do not treat yourself. This drug decreases your body's ability to fight infections. Try to avoid being around people who are sick. Do not become pregnant while taking this medicine or for at least 12 months after stopping it. Women should inform their doctor if they wish to become pregnant or think they might be pregnant. There is a potential for serious side effects to an unborn child. Talk to your health care professional or pharmacist for more information. Do not breast-feed an infant while taking this medicine or for at least 6 months after stopping it. What side effects may I notice from receiving this medicine? Side effects that you should report to your doctor or health care professional as soon as possible:  allergic reactions like skin rash, itching or hives; swelling of the face, lips, or tongue  breathing problems  chest pain  changes in vision  diarrhea  headache with fever, neck stiffness, sensitivity to light, nausea, or confusion  fast, irregular heartbeat  loss of memory  low blood counts - this medicine may decrease the number of white blood cells, red blood cells and platelets. You may be at increased risk for infections and bleeding.  mouth sores  problems with balance, talking, or walking  redness, blistering, peeling or loosening of the skin, including inside the mouth  signs of infection - fever or chills, cough, sore throat, pain or difficulty passing urine  signs and symptoms of kidney injury like trouble passing urine or change in the amount of urine  signs and symptoms of liver injury like dark yellow or brown urine; general ill feeling or flu-like symptoms; light-colored  stools; loss of appetite; nausea; right upper belly pain; unusually weak or tired; yellowing of the eyes or skin  signs and symptoms of low blood pressure like dizziness; feeling faint or lightheaded, falls; unusually weak or tired  stomach pain  swelling of the ankles, feet, hands  unusual bleeding or bruising  vomiting Side effects that usually do not require medical attention (report to your doctor or health care professional if they continue or are bothersome):  headache  joint pain  muscle cramps or muscle pain  nausea  tiredness This list may not describe all possible side effects. Call your doctor for medical advice about side effects. You may report side effects to FDA at 1-800-FDA-1088. Where should I keep my medicine? This drug is given in a hospital or clinic and will not be stored at home. NOTE: This sheet is a summary. It may not cover all possible information. If you have questions about this medicine, talk to your doctor, pharmacist, or health care provider.  2020 Elsevier/Gold Standard (2019-01-28 22:01:36)

## 2020-05-13 NOTE — Addendum Note (Signed)
Addended by: Chauncey Cruel on: 05/13/2020 08:48 AM   Modules accepted: Orders

## 2020-05-13 NOTE — Telephone Encounter (Signed)
PER DISCHARGE NOTES _____________________________  Admit date: 05/09/2020 Discharge date: 05/11/2020  Time spent:   40 minutes  Recommendations for Outpatient Follow-up:  1. Follow-up with Dr. 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 05/12/2011   How have you been since you were released from the hospital? Fatigued other better.    Do you understand why you were in the hospital? yes   Do you understand the discharge instructions? yes   Where were you discharged to? Home   Items Reviewed:  Medications reviewed: yes  Allergies reviewed: yes  Dietary changes reviewed: yes  Referrals reviewed: yes   Functional Questionnaire:   Activities of Daily Living (ADLs):   She states they are independent in the following: ambulation, bathing and hygiene, feeding, continence, grooming, toileting and dressing States they require assistance with the following: None   Any transportation issues/concerns?: no   Any patient concerns? no   Confirmed importance and date/time of follow-up visits scheduled yes  Provider Appointment booked with Dr.Dr. Jana Hakim  Confirmed with patient if condition begins to worsen call PCP or go to the ER.  Patient was given the office number and encouraged to call back with question or concerns.  : yes

## 2020-05-16 ENCOUNTER — Telehealth: Payer: Self-pay | Admitting: Oncology

## 2020-05-16 NOTE — Telephone Encounter (Signed)
Scheduled appts per 5/14 los. Pt confirmed appt date and times and also stated she would refer to Mychart for appt details.

## 2020-05-19 NOTE — Progress Notes (Signed)
[place]  Telephone:([phone] [phone]) [phone]     ID: Bailey Sheppard DOB: Oct 28, 1981  MR#: [id number]  UVO#:[id number]  Patient Care Team: Leamon Arnt, MD as PCP - General (Family Medicine) Chaz Mcglasson, Virgie Dad, MD as Consulting Physician (Hematology and Oncology) Yisroel Ramming, Everardo All, MD as Consulting Physician (Obstetrics and Gynecology) Chauncey Cruel, MD OTHER MD:  CHIEF COMPLAINT: Immune thrombocytopenia  CURRENT TREATMENT: Steroids, [IVIG]; rituximab   INTERVAL HISTORY: Bailey Sheppard returns today for follow up of her immune thrombocytopenia.   She continues on weekly rituximab. Today is week 2.  She has tolerated this so far with no side effects whatsoever.  She received IVIG 05/09/2020 at 05/10/2020 I would expect the results of that to last another week or at most another 2 weeks.  She continues on her Decadron taper.  Currently she is taking 1 tablet twice daily.  She will drop to 1 tablet daily beginning on 05/24/2020 and then 4 days later she will go to half a tablet daily.   REVIEW OF SYSTEMS: Anadalay has developed significant reflux issues.  This could be related to the steroids.  It was worse on Tuesday when she had a meal including salmon and eggs.  It seems to be a little bit better now.  She is having no other problems, specifically no bleeding or bruising, and has an excellent functional status overall   HISTORY OF CURRENT ILLNESS: From the May 2021 intake note:  I last saw Bailey Sheppard in 2019.  At that time she continued in remission from her ITP. She has been stable off treatment until this weekend, 05/07/2020, when she developed a bruise on her leg. She then had blood on her toothbrush. Finally she developed petechiae and "that's when I started paying attention," as she has had them before. She called her primary MD Dr Jonni Sanger who checked a CBC showing a platelet count of <10K with otherwise normal counts. The patient called our  office and was directed to the ED.  I met with [name] in the emergency room 05/10/2020.  At that time her platelet count was 7000.  We started her on steroids and IVIG and by the next day her platelets were just over 20,000.  She had a very rapid recovery, received 2 doses of IVIG total, and was discharged on 8 mg of dexamethasone twice daily.  She started on weekly rituximab and her steroid taper on 05/13/2020.  Going back 2 weeks she is not aware of any intercurrent infections or change in medications.  Her 73 year old son is a Associate Professor and they have traveled to [place] in [place] in the last 2 weekends.  They will had some sinus issues but no fever or symptoms of flu.  She has not received a coronavirus vaccine.  The patient's subsequent history is as detailed below.   PAST MEDICAL HISTORY: Past Medical History:  Diagnosis Date  . Acute pericarditis, unspecified 06/11/2013  . Anemia   . Anxiety   . Chronic hypertension during pregnancy 09/17/2018  . Depression   . Elevated hemoglobin A1c 2018   5.7  . Fibroids   . Hypertension   . ITP (idiopathic thrombocytopenic purpura)   . Obesity   . Vaginal Pap smear, abnormal     PAST SURGICAL HISTORY: Past Surgical History:  Procedure Laterality Date  . INTRAUTERINE DEVICE (IUD) INSERTION  04/2012  . UMBILICAL HERNIA REPAIR      FAMILY HISTORY Family History  Problem Relation Age of Onset  .  Hypertension Mother   . Diabetes Father   . Kidney failure Father   . Heart failure Father   . Diabetes Brother   . Lupus Cousin   . Diabetes Maternal Grandfather   . Kidney failure Maternal Grandfather   . Diabetes Paternal Grandmother     GYNECOLOGIC HISTORY:  G3 P3, Menarche age 31. Age of first live birth: 82.    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  Her husband was a Software engineer at a private school but passed away suddenly, at home, from a  presumed myocardial infarction, February 2021.  (His family has a strong history of heart attacks and strokes).. Their 3 sons  are currently 3, 26, and less than 72 years old    ADVANCED DIRECTIVES: To be discussed   HEALTH MAINTENANCE: Social History   Tobacco Use  . Smoking status: Never Smoker  . Smokeless tobacco: Never Used  Substance Use Topics  . Alcohol use: No  . Drug use: No      Allergies  Allergen Reactions  . Nsaids     ITP    Current Outpatient Medications  Medication Sig Dispense Refill  . acetaminophen (TYLENOL) 500 MG tablet Take 500 mg by mouth every 6 (six) hours as needed for headache.    . dexamethasone (DECADRON) 4 MG tablet Take 1 tablet (4 mg total) by mouth 2 (two) times daily. 60 tablet 1  . diltiazem (CARDIZEM CD) 240 MG 24 hr capsule Take 1 capsule (240 mg total) by mouth daily. 90 capsule 3  . hydrochlorothiazide (HYDRODIURIL) 25 MG tablet Take 1 tablet (25 mg total) by mouth daily. 90 tablet 3  . levonorgestrel (MIRENA) 20 MCG/24HR IUD 1 each by Intrauterine route once.    Marland Kitchen MELATONIN GUMMIES PO Take 1 each by mouth at bedtime as needed (sleep).    . Multiple Vitamin (MULTIVITAMIN WITH MINERALS) TABS tablet Take 1 tablet by mouth daily.    . traMADol (ULTRAM) 50 MG tablet Take 50 mg by mouth every 6 (six) hours as needed for moderate pain.     No current facility-administered medications for this visit.    OBJECTIVE: African-American woman who appears well  Vitals:   05/20/20 0921  BP: (!) 149/89  Pulse: 70  Resp: 17  Temp: 99.1 F (37.3 C)  SpO2: 99%     Body mass index is 42.17 kg/m.   Wt Readings from Last 3 Encounters:  05/20/20 293 lb 14.4 oz (133.3 kg)  05/13/20 297 lb 3.2 oz (134.8 kg)  05/09/20 290 lb (131.5 kg)      ECOG FS:1 - Symptomatic but completely ambulatory  Sclerae unicteric, EOMs intact Wearing a mask No cervical or supraclavicular adenopathy Lungs no rales or rhonchi Heart regular rate and rhythm Abd soft,  nontender, positive bowel sounds MSK no focal spinal tenderness, no upper extremity lymphedema Neuro: nonfocal, well oriented, appropriate affect   LAB RESULTS:  CMP     Component Value Date/Time   NA 141 05/13/2020 0755   NA 140 09/13/2014 1400   K 3.4 (L) 05/13/2020 0755   K 3.7 09/13/2014 1400   CL 105 05/13/2020 0755   CL 105 06/19/2013 0845   CO2 26 05/13/2020 0755   CO2 24 09/13/2014 1400   GLUCOSE 94 05/13/2020 0755   GLUCOSE 110 09/13/2014 1400   GLUCOSE 97 06/19/2013 0845   BUN 18 05/13/2020 0755   BUN 9.7 09/13/2014 1400  CREATININE 0.88 05/13/2020 0755   CREATININE 0.76 03/06/2019 1440   CREATININE 0.8 09/13/2014 1400   CALCIUM 8.6 (L) 05/13/2020 0755   CALCIUM 9.1 09/13/2014 1400   PROT 8.6 (H) 05/13/2020 0755   PROT 7.1 09/13/2014 1400   ALBUMIN 3.3 (L) 05/13/2020 0755   ALBUMIN 4.0 09/13/2014 1400   AST 42 (H) 05/13/2020 0755   AST 14 09/13/2014 1400   ALT 73 (H) 05/13/2020 0755   ALT 13 09/13/2014 1400   ALKPHOS 62 05/13/2020 0755   ALKPHOS 53 09/13/2014 1400   BILITOT 1.0 05/13/2020 0755   BILITOT 0.95 09/13/2014 1400   GFRNONAA >60 05/13/2020 0755   GFRAA >60 05/13/2020 0755    No results found for: TOTALPROTELP, ALBUMINELP, A1GS, A2GS, BETS, BETA2SER, GAMS, MSPIKE, SPEI  No results found for: KPAFRELGTCHN, LAMBDASER, KAPLAMBRATIO  Lab Results  Component Value Date   WBC 10.6 (H) 05/20/2020   NEUTROABS 6.8 05/20/2020   HGB 12.9 05/20/2020   HCT 39.1 05/20/2020   MCV 83.4 05/20/2020   PLT 297 05/20/2020   No results found for: LABCA2  No components found for: LABCAN125  No results for input(s): INR in the last 168 hours.  No results found for: LABCA2  No results found for: CAN199  No results found for: CAN125  No results found for: CAN153  No results found for: CA2729  No components found for: HGQUANT  No results found for: CEA1 / No results found for: CEA1   No results found for: AFPTUMOR  No results found for:  CHROMOGRNA  Appointment on 05/20/2020  Component Date Value Ref Range Status  . WBC 05/20/2020 10.6* 4.0 - 10.5 K/uL Final  . RBC 05/20/2020 4.69  3.87 - 5.11 MIL/uL Final  . Hemoglobin 05/20/2020 12.9  12.0 - 15.0 g/dL Final  . HCT 05/20/2020 39.1  36.0 - 46.0 % Final  . MCV 05/20/2020 83.4  80.0 - 100.0 fL Final  . MCH 05/20/2020 27.5  26.0 - 34.0 pg Final  . MCHC 05/20/2020 33.0  30.0 - 36.0 g/dL Final  . RDW 05/20/2020 15.3  11.5 - 15.5 % Final  . Platelets 05/20/2020 297  150 - 400 K/uL Final  . nRBC 05/20/2020 0.4* 0.0 - 0.2 % Final  . Neutrophils Relative % 05/20/2020 64  % Final  . Neutro Abs 05/20/2020 6.8  1.7 - 7.7 K/uL Final  . Lymphocytes Relative 05/20/2020 24  % Final  . Lymphs Abs 05/20/2020 2.6  0.7 - 4.0 K/uL Final  . Monocytes Relative 05/20/2020 9  % Final  . Monocytes Absolute 05/20/2020 0.9  0.1 - 1.0 K/uL Final  . Eosinophils Relative 05/20/2020 0  % Final  . Eosinophils Absolute 05/20/2020 0.0  0.0 - 0.5 K/uL Final  . Basophils Relative 05/20/2020 0  % Final  . Basophils Absolute 05/20/2020 0.0  0.0 - 0.1 K/uL Final  . Immature Granulocytes 05/20/2020 3  % Final  . Abs Immature Granulocytes 05/20/2020 0.27* 0.00 - 0.07 K/uL Final   Performed at [facility], [address] [place], [place] [address]    (this displays the last labs from the last 3 days)  No results found for: HGBA, HGBA2QUANT, HGBFQUANT, HGBSQUAN (Hemoglobinopathy evaluation)   Lab Results  Component Value Date   LDH 159 07/14/2018    Lab Results  Component Value Date   IRON 49 04/25/2012   TIBC 391 04/25/2012   IRONPCTSAT 13 (L) 04/25/2012   (Iron and TIBC)  Lab Results  Component Value   Date   FERRITIN 29 12/02/2018    Urinalysis    Component Value Date/Time   COLORURINE YELLOW 01/21/2014 2018   APPEARANCEUR CLEAR 01/21/2014 2018   LABSPEC 1.022 01/21/2014 2018   LABSPEC 1.005 06/19/2013 1127   PHURINE 6.0 01/21/2014 2018   GLUCOSEU NEGATIVE  01/21/2014 2018   GLUCOSEU Negative 06/19/2013 1127   HGBUR NEGATIVE 01/21/2014 2018   [place] NEGATIVE 01/21/2014 2018   BILIRUBINUR Negative 06/19/2013 1127   [place] 01/21/2014 2018   PROTEINUR NEGATIVE 01/21/2014 2018   UROBILINOGEN 0.2 01/21/2014 2018   UROBILINOGEN 0.2 06/19/2013 1127   NITRITE NEGATIVE 01/21/2014 2018   LEUKOCYTESUR NEGATIVE 01/21/2014 2018   LEUKOCYTESUR Small 06/19/2013 1127    STUDIES: No results found.   ELIGIBLE FOR AVAILABLE RESEARCH PROTOCOL: no  ASSESSMENT: 39 y.o. [place], [place] woman:  1.  ITP, requiring hospitalization in June 2014. Patient was treated with IV dexamethasone and IVIG. She was then on a dexamethasone taper between 06/22/2013 and 07/12/2013. She received 6 weekly doses of rituximab, followed by 2 q. three-week doses, with the final dose given on 08/21/2013.  2. History of iron deficiency anemia,             (a) IV Feraheme was given in March 2013.             (b) received repeat IV Feraheme on 07/17/2018 and 07/24/2018 for a ferritin less than 6  3. Acute pericarditis - resolved   4.  Pregnancy complicated by iron deficiency and thrombocytopenia: Resolved  5.  Recurrent ITP 05/09/2020: platelet count 7000  (a) dexamethasone started 05/09/2020, on taper  (b) IVIG given 05/09/2020 and 05/10/2020  (c) rituximab started 05/13/2020   (i) negative hepatitis B surface antigen August 2018 and core antibody June 2014   (ii) repeat hepatitis studies   PLAN: Tamatha is tolerating treatment well and has an excellent platelet count.  I would anticipate that the effect of the IVIG will wane over the next 2 weeks.  She is having some reflux and gastritis secondary to the steroids.  We are continuing the taper.  She will be off that medication in about 12 days.  We are continuing rituximab weekly.  I will see her again in 2 weeks.  She knows to call for any other issue that may develop before the next  visit.  She is having some urinary leakage issues which are not related to the ITP problem.  I suggested she void every 2 hours during the day until her bladder gets retrained.  Total encounter time 25 minutes.   Chauncey Cruel, MD   05/20/2020 9:35 AM [facility] [place], [place] [address] Tel. [phone]    Fax. [phone]   I, Wilburn Mylar, am acting as scribe for Dr. Virgie Dad. Emil Weigold.  I, Lurline Del MD, have reviewed the above documentation for accuracy and completeness, and I agree with the above.   *Total Encounter Time as defined by the Centers for Medicare and Medicaid Services includes, in addition to the face-to-face time of a patient visit (documented in the note above) non-face-to-face time: obtaining and reviewing outside history, ordering and reviewing medications, tests or procedures, care coordination (communications with other health care professionals or caregivers) and documentation in the medical record.

## 2020-05-19 NOTE — Progress Notes (Signed)
Rapid Infusion Rituximab Pharmacist Evaluation  Bailey Sheppard is a 39 y.o. female being treated with rituximab for ITP. This patient may be considered for RIR.   A pharmacist has verified the patient tolerated rituximab infusions per the [facility] standard infusion protocol without grade 3-4 infusion reactions. The treatment plan will be updated to reflect RIR if the patient qualifies per the checklist below:   Age > 29 years old Yes   Clinically significant cardiovascular disease No   Circulating lymphocyte count < 5000/uL prior to cycle two Yes  Lab Results  Component Value Date   LYMPHSABS 2.5 05/13/2020    Prior documented grade 3-4 infusion reaction to rituximab No   Prior documented grade 1-2 infusion reaction to rituximab (If YES, Pharmacist will confirm with Physician if patient is still a candidate for RIR) No   Previous rituximab infusion within the past 6 months Yes   Treatment Plan updated orders to reflect RIR Yes    Bailey Sheppard does meet the criteria for Rapid Infusion Rituximab. This patient is going to be switched to rapid infusion rituximab. She has previously tolerated rapid rituximab several years ago.   [place] Levo [facility] 05/19/20 10:27 AM

## 2020-05-20 ENCOUNTER — Inpatient Hospital Stay (HOSPITAL_BASED_OUTPATIENT_CLINIC_OR_DEPARTMENT_OTHER): Payer: No Typology Code available for payment source | Admitting: Oncology

## 2020-05-20 ENCOUNTER — Inpatient Hospital Stay: Payer: No Typology Code available for payment source

## 2020-05-20 ENCOUNTER — Other Ambulatory Visit: Payer: Self-pay

## 2020-05-20 VITALS — BP 149/89 | HR 70 | Temp 99.1°F | Resp 17 | Ht 70.0 in | Wt 293.9 lb

## 2020-05-20 VITALS — BP 129/86 | HR 66 | Temp 97.8°F | Resp 18

## 2020-05-20 DIAGNOSIS — D693 Immune thrombocytopenic purpura: Secondary | ICD-10-CM

## 2020-05-20 DIAGNOSIS — I10 Essential (primary) hypertension: Secondary | ICD-10-CM

## 2020-05-20 DIAGNOSIS — Z5112 Encounter for antineoplastic immunotherapy: Secondary | ICD-10-CM | POA: Diagnosis not present

## 2020-05-20 LAB — CBC WITH DIFFERENTIAL/PLATELET
Abs Immature Granulocytes: 0.27 10*3/uL — ABNORMAL HIGH (ref 0.00–0.07)
Basophils Absolute: 0 10*3/uL (ref 0.0–0.1)
Basophils Relative: 0 %
Eosinophils Absolute: 0 10*3/uL (ref 0.0–0.5)
Eosinophils Relative: 0 %
HCT: 39.1 % (ref 36.0–46.0)
Hemoglobin: 12.9 g/dL (ref 12.0–15.0)
Immature Granulocytes: 3 %
Lymphocytes Relative: 24 %
Lymphs Abs: 2.6 10*3/uL (ref 0.7–4.0)
MCH: 27.5 pg (ref 26.0–34.0)
MCHC: 33 g/dL (ref 30.0–36.0)
MCV: 83.4 fL (ref 80.0–100.0)
Monocytes Absolute: 0.9 10*3/uL (ref 0.1–1.0)
Monocytes Relative: 9 %
Neutro Abs: 6.8 10*3/uL (ref 1.7–7.7)
Neutrophils Relative %: 64 %
Platelets: 297 10*3/uL (ref 150–400)
RBC: 4.69 MIL/uL (ref 3.87–5.11)
RDW: 15.3 % (ref 11.5–15.5)
WBC: 10.6 10*3/uL — ABNORMAL HIGH (ref 4.0–10.5)
nRBC: 0.4 % — ABNORMAL HIGH (ref 0.0–0.2)

## 2020-05-20 LAB — COMPREHENSIVE METABOLIC PANEL
ALT: 29 U/L (ref 0–44)
AST: 13 U/L — ABNORMAL LOW (ref 15–41)
Albumin: 3 g/dL — ABNORMAL LOW (ref 3.5–5.0)
Alkaline Phosphatase: 55 U/L (ref 38–126)
Anion gap: 8 (ref 5–15)
BUN: 15 mg/dL (ref 6–20)
CO2: 30 mmol/L (ref 22–32)
Calcium: 8.4 mg/dL — ABNORMAL LOW (ref 8.9–10.3)
Chloride: 103 mmol/L (ref 98–111)
Creatinine, Ser: 0.83 mg/dL (ref 0.44–1.00)
GFR calc Af Amer: >60 mL/min (ref >60)
GFR calc non Af Amer: >60 mL/min (ref >60)
Glucose, Bld: 98 mg/dL (ref 70–99)
Potassium: 3.1 mmol/L — ABNORMAL LOW (ref 3.5–5.1)
Sodium: 141 mmol/L (ref 135–145)
Total Bilirubin: 0.9 mg/dL (ref 0.3–1.2)
Total Protein: 7.1 g/dL (ref 6.5–8.1)

## 2020-05-20 LAB — HEPATITIS B CORE ANTIBODY, IGM: Hep B C IgM: NONREACTIVE

## 2020-05-20 LAB — HEPATITIS B SURFACE ANTIGEN: Hepatitis B Surface Ag: NONREACTIVE

## 2020-05-20 MED ORDER — SODIUM CHLORIDE 0.9 % IV SOLN
375.0000 mg/m2 | Freq: Once | INTRAVENOUS | Status: AC
  Administered 2020-05-20: 1000 mg via INTRAVENOUS
  Filled 2020-05-20: qty 100

## 2020-05-20 MED ORDER — DIPHENHYDRAMINE HCL 25 MG PO CAPS
ORAL_CAPSULE | ORAL | Status: AC
  Filled 2020-05-20: qty 2

## 2020-05-20 MED ORDER — DIPHENHYDRAMINE HCL 25 MG PO CAPS
50.0000 mg | ORAL_CAPSULE | Freq: Once | ORAL | Status: AC
  Administered 2020-05-20: 50 mg via ORAL

## 2020-05-20 MED ORDER — SODIUM CHLORIDE 0.9 % IV SOLN
Freq: Once | INTRAVENOUS | Status: AC
  Filled 2020-05-20: qty 250

## 2020-05-20 MED ORDER — ACETAMINOPHEN 325 MG PO TABS
ORAL_TABLET | ORAL | Status: AC
  Filled 2020-05-20: qty 2

## 2020-05-20 MED ORDER — ACETAMINOPHEN 325 MG PO TABS
650.0000 mg | ORAL_TABLET | Freq: Once | ORAL | Status: AC
  Administered 2020-05-20: 650 mg via ORAL

## 2020-05-20 NOTE — Patient Instructions (Signed)
[facility] Discharge Instructions for Patients Receiving Chemotherapy  Today you received the following Immunotherapy agent: Rituximab  To help prevent nausea and vomiting after your treatment, we encourage you to take your nausea medication as directed by your MD.   If you develop nausea and vomiting that is not controlled by your nausea medication, call the clinic.   BELOW ARE SYMPTOMS THAT SHOULD BE REPORTED IMMEDIATELY:  *FEVER GREATER THAN 100.5 F  *CHILLS WITH OR WITHOUT FEVER  NAUSEA AND VOMITING THAT IS NOT CONTROLLED WITH YOUR NAUSEA MEDICATION  *UNUSUAL SHORTNESS OF BREATH  *UNUSUAL BRUISING OR BLEEDING  TENDERNESS IN MOUTH AND THROAT WITH OR WITHOUT PRESENCE OF ULCERS  *URINARY PROBLEMS  *BOWEL PROBLEMS  UNUSUAL RASH Items with * indicate a potential emergency and should be followed up as soon as possible.  Feel free to call the clinic should you have any questions or concerns. The clinic phone number is [phone].  Please show the [place] at check-in to the Emergency Department and triage nurse.  Coronavirus (COVID-19) Are you at risk?  Are you at risk for the Coronavirus (COVID-19)?  To be considered HIGH RISK for Coronavirus (COVID-19), you have to meet the following criteria:  . Traveled to [place], [place], [place], [place] or [place]; or in the [place] to [place], [place], [place], or [place]; and have fever, cough, and shortness of breath within the last 2 weeks of travel OR . Been in close contact with a person diagnosed with COVID-19 within the last 2 weeks and have fever, cough, and shortness of breath . IF YOU DO NOT MEET THESE CRITERIA, YOU ARE CONSIDERED LOW RISK FOR COVID-19.  What to do if you are HIGH RISK for COVID-19?  Marland Kitchen If you are having a medical emergency, call 911. . Seek medical care right away. Before you go to a doctor's office, urgent care or emergency department, call ahead and tell them  about your recent travel, contact with someone diagnosed with COVID-19, and your symptoms. You should receive instructions from your physician's office regarding next steps of care.  . When you arrive at healthcare provider, tell the healthcare staff immediately you have returned from visiting [place], [place], [place], [place] or [place]; or traveled in the [place] to [place], [place], [place], or [place]; in the last two weeks or you have been in close contact with a person diagnosed with COVID-19 in the last 2 weeks.   . Tell the health care staff about your symptoms: fever, cough and shortness of breath. . After you have been seen by a medical provider, you will be either: o Tested for (COVID-19) and discharged home on quarantine except to seek medical care if symptoms worsen, and asked to  - Stay home and avoid contact with others until you get your results (4-5 days)  - Avoid travel on public transportation if possible (such as bus, train, or airplane) or o Sent to the Emergency Department by EMS for evaluation, COVID-19 testing, and possible admission depending on your condition and test results.  What to do if you are LOW RISK for COVID-19?  Reduce your risk of any infection by using the same precautions used for avoiding the common cold or flu:  Marland Kitchen Wash your hands often with soap and warm water for at least 20 seconds.  If soap and water are not readily available, use an alcohol-based hand sanitizer with at least 60% alcohol.  . If  coughing or sneezing, cover your mouth and nose by coughing or sneezing into the elbow areas of your shirt or coat, into a tissue or into your sleeve (not your hands). . Avoid shaking hands with others and consider head nods or verbal greetings only. . Avoid touching your eyes, nose, or mouth with unwashed hands.  . Avoid close contact with people who are sick. . Avoid places or events with large numbers of people in one location, like concerts or  sporting events. . Carefully consider travel plans you have or are making. . If you are planning any travel outside or inside the [place], visit the CDC's Travelers' Health webpage for the latest health notices. . If you have some symptoms but not all symptoms, continue to monitor at home and seek medical attention if your symptoms worsen. . If you are having a medical emergency, call 911.   [place] / e-Visit: [email or web address]         [facility]: [facility]: [phone]                   [facility]: [phone]

## 2020-05-23 ENCOUNTER — Telehealth: Payer: Self-pay | Admitting: Oncology

## 2020-05-23 NOTE — Telephone Encounter (Signed)
Scheduled per 5/21 los. Noted to give pt updated calendar on next visit.

## 2020-05-23 NOTE — Telephone Encounter (Signed)
Scheduled per 5/21 los. Noted to give pt updated calendar on next visit. Appts has been scheduled per [place].

## 2020-05-23 NOTE — Progress Notes (Signed)
Pharmacist Chemotherapy Monitoring - Follow Up Assessment    I verify that I have reviewed each item in the below checklist:  . Regimen for the patient is scheduled for the appropriate day and plan matches scheduled date. Marland Kitchen Appropriate non-routine labs are ordered dependent on drug ordered. . If applicable, additional medications reviewed and ordered per protocol based on lifetime cumulative doses and/or treatment regimen.   Plan for follow-up and/or issues identified: No . I-vent associated with next due treatment: No . MD and/or nursing notified: No  Bailey Sheppard D 05/23/2020 4:20 PM

## 2020-05-27 ENCOUNTER — Other Ambulatory Visit: Payer: Self-pay

## 2020-05-27 ENCOUNTER — Inpatient Hospital Stay: Payer: No Typology Code available for payment source

## 2020-05-27 VITALS — BP 121/76 | HR 100 | Temp 97.8°F | Resp 18 | Wt 295.0 lb

## 2020-05-27 DIAGNOSIS — Z5112 Encounter for antineoplastic immunotherapy: Secondary | ICD-10-CM | POA: Diagnosis not present

## 2020-05-27 DIAGNOSIS — D693 Immune thrombocytopenic purpura: Secondary | ICD-10-CM

## 2020-05-27 DIAGNOSIS — I10 Essential (primary) hypertension: Secondary | ICD-10-CM

## 2020-05-27 LAB — CBC WITH DIFFERENTIAL/PLATELET
Abs Immature Granulocytes: 0.06 10*3/uL (ref 0.00–0.07)
Basophils Absolute: 0 10*3/uL (ref 0.0–0.1)
Basophils Relative: 0 %
Eosinophils Absolute: 0 10*3/uL (ref 0.0–0.5)
Eosinophils Relative: 0 %
HCT: 38.1 % (ref 36.0–46.0)
Hemoglobin: 12.5 g/dL (ref 12.0–15.0)
Immature Granulocytes: 1 %
Lymphocytes Relative: 13 %
Lymphs Abs: 1.5 10*3/uL (ref 0.7–4.0)
MCH: 27.2 pg (ref 26.0–34.0)
MCHC: 32.8 g/dL (ref 30.0–36.0)
MCV: 83 fL (ref 80.0–100.0)
Monocytes Absolute: 0.7 10*3/uL (ref 0.1–1.0)
Monocytes Relative: 6 %
Neutro Abs: 9.4 10*3/uL — ABNORMAL HIGH (ref 1.7–7.7)
Neutrophils Relative %: 80 %
Platelets: 149 10*3/uL — ABNORMAL LOW (ref 150–400)
RBC: 4.59 MIL/uL (ref 3.87–5.11)
RDW: 16.2 % — ABNORMAL HIGH (ref 11.5–15.5)
WBC: 11.7 10*3/uL — ABNORMAL HIGH (ref 4.0–10.5)
nRBC: 0 % (ref 0.0–0.2)

## 2020-05-27 MED ORDER — SODIUM CHLORIDE 0.9 % IV SOLN
Freq: Once | INTRAVENOUS | Status: AC
  Filled 2020-05-27: qty 250

## 2020-05-27 MED ORDER — SODIUM CHLORIDE 0.9 % IV SOLN
375.0000 mg/m2 | Freq: Once | INTRAVENOUS | Status: AC
  Administered 2020-05-27: 1000 mg via INTRAVENOUS
  Filled 2020-05-27: qty 100

## 2020-05-27 MED ORDER — ACETAMINOPHEN 325 MG PO TABS
ORAL_TABLET | ORAL | Status: AC
  Filled 2020-05-27: qty 2

## 2020-05-27 MED ORDER — DIPHENHYDRAMINE HCL 25 MG PO CAPS
ORAL_CAPSULE | ORAL | Status: AC
  Filled 2020-05-27: qty 2

## 2020-05-27 MED ORDER — DIPHENHYDRAMINE HCL 25 MG PO CAPS
50.0000 mg | ORAL_CAPSULE | Freq: Once | ORAL | Status: AC
  Administered 2020-05-27: 50 mg via ORAL

## 2020-05-27 MED ORDER — ACETAMINOPHEN 325 MG PO TABS
650.0000 mg | ORAL_TABLET | Freq: Once | ORAL | Status: AC
  Administered 2020-05-27: 650 mg via ORAL

## 2020-05-27 NOTE — Patient Instructions (Signed)
[facility] °Discharge Instructions for Patients Receiving Chemotherapy ° °Today you received the following chemotherapy agents: Rituximab ° °To help prevent nausea and vomiting after your treatment, we encourage you to take your nausea medication as directed.  °  °If you develop nausea and vomiting that is not controlled by your nausea medication, call the clinic.  ° °BELOW ARE SYMPTOMS THAT SHOULD BE REPORTED IMMEDIATELY: °· *FEVER GREATER THAN 100.5 F °· *CHILLS WITH OR WITHOUT FEVER °· NAUSEA AND VOMITING THAT IS NOT CONTROLLED WITH YOUR NAUSEA MEDICATION °· *UNUSUAL SHORTNESS OF BREATH °· *UNUSUAL BRUISING OR BLEEDING °· TENDERNESS IN MOUTH AND THROAT WITH OR WITHOUT PRESENCE OF ULCERS °· *URINARY PROBLEMS °· *BOWEL PROBLEMS °· UNUSUAL RASH °Items with * indicate a potential emergency and should be followed up as soon as possible. ° °Feel free to call the clinic should you have any questions or concerns. The clinic phone number is [phone]. ° °Please show the CHEMO ALERT CARD at check-in to the Emergency Department and triage nurse. ° ° °

## 2020-05-31 ENCOUNTER — Other Ambulatory Visit: Payer: Self-pay | Admitting: Oncology

## 2020-05-31 ENCOUNTER — Encounter (HOSPITAL_COMMUNITY): Payer: Self-pay

## 2020-05-31 ENCOUNTER — Emergency Department (HOSPITAL_COMMUNITY)
Admission: EM | Admit: 2020-05-31 | Discharge: 2020-05-31 | Disposition: A | Discharge status: Home / Self Care | Payer: No Typology Code available for payment source | Attending: Emergency Medicine | Admitting: Emergency Medicine

## 2020-05-31 ENCOUNTER — Other Ambulatory Visit: Payer: Self-pay

## 2020-05-31 DIAGNOSIS — H5711 Ocular pain, right eye: Secondary | ICD-10-CM | POA: Diagnosis present

## 2020-05-31 DIAGNOSIS — Z975 Presence of (intrauterine) contraceptive device: Secondary | ICD-10-CM | POA: Diagnosis not present

## 2020-05-31 DIAGNOSIS — H5789 Other specified disorders of eye and adnexa: Secondary | ICD-10-CM

## 2020-05-31 DIAGNOSIS — Z79899 Other long term (current) drug therapy: Secondary | ICD-10-CM | POA: Diagnosis not present

## 2020-05-31 DIAGNOSIS — I10 Essential (primary) hypertension: Secondary | ICD-10-CM | POA: Diagnosis not present

## 2020-05-31 LAB — CBC WITH DIFFERENTIAL/PLATELET
Abs Immature Granulocytes: 0.02 10*3/uL (ref 0.00–0.07)
Basophils Absolute: 0 10*3/uL (ref 0.0–0.1)
Basophils Relative: 0 %
Eosinophils Absolute: 0.2 10*3/uL (ref 0.0–0.5)
Eosinophils Relative: 3 %
HCT: 39.4 % (ref 36.0–46.0)
Hemoglobin: 13.3 g/dL (ref 12.0–15.0)
Immature Granulocytes: 0 %
Lymphocytes Relative: 14 %
Lymphs Abs: 0.8 10*3/uL (ref 0.7–4.0)
MCH: 28.9 pg (ref 26.0–34.0)
MCHC: 33.8 g/dL (ref 30.0–36.0)
MCV: 85.5 fL (ref 80.0–100.0)
Monocytes Absolute: 0.5 10*3/uL (ref 0.1–1.0)
Monocytes Relative: 9 %
Neutro Abs: 4.3 10*3/uL (ref 1.7–7.7)
Neutrophils Relative %: 74 %
Platelets: 79 10*3/uL — ABNORMAL LOW (ref 150–400)
RBC: 4.61 MIL/uL (ref 3.87–5.11)
RDW: 17.1 % — ABNORMAL HIGH (ref 11.5–15.5)
WBC: 5.9 10*3/uL (ref 4.0–10.5)
nRBC: 0 % (ref 0.0–0.2)

## 2020-05-31 LAB — COMPREHENSIVE METABOLIC PANEL
ALT: 31 U/L (ref 0–44)
AST: 20 U/L (ref 15–41)
Albumin: 3.4 g/dL — ABNORMAL LOW (ref 3.5–5.0)
Alkaline Phosphatase: 42 U/L (ref 38–126)
Anion gap: 11 (ref 5–15)
BUN: 11 mg/dL (ref 6–20)
CO2: 26 mmol/L (ref 22–32)
Calcium: 8.6 mg/dL — ABNORMAL LOW (ref 8.9–10.3)
Chloride: 99 mmol/L (ref 98–111)
Creatinine, Ser: 0.69 mg/dL (ref 0.44–1.00)
GFR calc Af Amer: >60 mL/min (ref >60)
GFR calc non Af Amer: >60 mL/min (ref >60)
Glucose, Bld: 100 mg/dL — ABNORMAL HIGH (ref 70–99)
Potassium: 3.4 mmol/L — ABNORMAL LOW (ref 3.5–5.1)
Sodium: 136 mmol/L (ref 135–145)
Total Bilirubin: 1.1 mg/dL (ref 0.3–1.2)
Total Protein: 7 g/dL (ref 6.5–8.1)

## 2020-05-31 MED ORDER — TETRACAINE HCL 0.5 % OP SOLN
2.0000 [drp] | Freq: Once | OPHTHALMIC | Status: AC
  Administered 2020-05-31: 2 [drp] via OPHTHALMIC
  Filled 2020-05-31: qty 4

## 2020-05-31 MED ORDER — PREDNISOLONE ACETATE 1 % OP SUSP
1.0000 [drp] | Freq: Four times a day (QID) | OPHTHALMIC | Status: DC
  Administered 2020-05-31: 1 [drp] via OPHTHALMIC
  Filled 2020-05-31: qty 5

## 2020-05-31 MED ORDER — FLUORESCEIN SODIUM 1 MG OP STRP
1.0000 | ORAL_STRIP | Freq: Once | OPHTHALMIC | Status: AC
  Administered 2020-05-31: 1 via OPHTHALMIC
  Filled 2020-05-31: qty 1

## 2020-05-31 NOTE — ED Triage Notes (Addendum)
Patient c/o right eye pain and redness since yesterday. Patient is sensitive to light.

## 2020-05-31 NOTE — ED Provider Notes (Signed)
[facility] DEPT Provider Note   CSN: [id number] Arrival date & time: 05/31/20  [id number]     History Chief Complaint  Patient presents with  . Eye Pain    Bailey Sheppard is a 39 y.o. female.  The history is provided by the patient and medical records. No language interpreter was used.  Eye Pain This is a recurrent problem. The current episode started yesterday. The problem occurs constantly. The problem has been gradually worsening. Pertinent negatives include no chest pain, no abdominal pain, no headaches and no shortness of breath. Nothing (bright light) aggravates the symptoms. Nothing relieves the symptoms. She has tried nothing for the symptoms. The treatment provided no relief.       Past Medical History:  Diagnosis Date  . Acute pericarditis, unspecified 06/11/2013  . Anemia   . Anxiety   . Chronic hypertension during pregnancy 09/17/2018  . Depression   . Elevated hemoglobin A1c 2018   5.7  . Fibroids   . Hypertension   . ITP (idiopathic thrombocytopenic purpura)   . Obesity   . Vaginal Pap smear, abnormal     Patient Active Problem List   Diagnosis Date Noted  . Idiopathic thrombocytopenic purpura (ITP) (HCC) 05/09/2020  . Prediabetes 02/25/2020  . Migraine with aura and without status migrainosus, not intractable 02/23/2020  . Pure hypercholesterolemia 03/08/2019  . Morbid obesity with BMI of 40.0-44.9, adult ([facility]) 12/03/2017  . Essential hypertension 12/20/2016  . IUD (intrauterine device) in place 08/01/2013  . Fibroid 05/13/2012  . Idiopathic thrombocytopenic purpura ([facility]) 02/20/2012    Past Surgical History:  Procedure Laterality Date  . INTRAUTERINE DEVICE (IUD) INSERTION  04/2012  . UMBILICAL HERNIA REPAIR       OB History    Gravida  4   Para  3   Term  3   Preterm  0   AB  1   Living  3     SAB  0   TAB  1   Ectopic  0   Multiple  0   Live Births  1           Family History  Problem  Relation Age of Onset  . Hypertension Mother   . Diabetes Father   . Kidney failure Father   . Heart failure Father   . Diabetes Brother   . Lupus Cousin   . Diabetes Maternal Grandfather   . Kidney failure Maternal Grandfather   . Diabetes Paternal Grandmother     Social History   Tobacco Use  . Smoking status: Never Smoker  . Smokeless tobacco: Never Used  Substance Use Topics  . Alcohol use: No  . Drug use: No    Home Medications Prior to Admission medications   Medication Sig Start Date End Date Taking? Authorizing Provider  acetaminophen (TYLENOL) 500 MG tablet Take 500 mg by mouth every 6 (six) hours as needed for headache.    [provider]  dexamethasone (DECADRON) 4 MG tablet Take 1 tablet (4 mg total) by mouth 2 (two) times daily. 05/20/20   Magrinat, Virgie Dad, MD  diltiazem (CARDIZEM CD) 240 MG 24 hr capsule Take 1 capsule (240 mg total) by mouth daily. 02/24/20   Leamon Arnt, MD  hydrochlorothiazide (HYDRODIURIL) 25 MG tablet Take 1 tablet (25 mg total) by mouth daily. 02/24/20   Leamon Arnt, MD  levonorgestrel (MIRENA) 20 MCG/24HR IUD 1 each by Intrauterine route once.    [provider]  MELATONIN  GUMMIES PO Take 1 each by mouth at bedtime as needed (sleep).    [provider]  Multiple Vitamin (MULTIVITAMIN WITH MINERALS) TABS tablet Take 1 tablet by mouth daily.    [provider]  traMADol (ULTRAM) 50 MG tablet Take 50 mg by mouth every 6 (six) hours as needed for moderate pain.    [provider]    Allergies    Nsaids  Review of Systems   Review of Systems  Constitutional: Negative for chills, diaphoresis, fatigue and fever.  HENT: Negative for congestion and rhinorrhea.   Eyes: Positive for photophobia, pain, redness and visual disturbance.  Respiratory: Negative for chest tightness, shortness of breath and wheezing.   Cardiovascular: Negative for chest pain, palpitations and leg swelling.    Gastrointestinal: Negative for abdominal distention, abdominal pain, constipation, diarrhea, nausea and vomiting.  Genitourinary: Negative for dysuria, flank pain and frequency.  Musculoskeletal: Negative for back pain, neck pain and neck stiffness.  Skin: Negative for rash and wound.  Neurological: Negative for weakness, light-headedness, numbness and headaches.  Psychiatric/Behavioral: Negative for agitation.  All other systems reviewed and are negative.   Physical Exam Updated Vital Signs BP (!) 158/116 (BP Location: Left Arm)   Pulse 89   Temp 98.3 F (36.8 C) (Oral)   Resp 16   Ht 5\' 10"  (1.778 m)   Wt 131.5 kg   SpO2 96%   BMI 41.61 kg/m   Physical Exam Vitals and nursing note reviewed.  Constitutional:      General: She is not in acute distress.    Appearance: She is well-developed. She is not ill-appearing, toxic-appearing or diaphoretic.  HENT:     Head: Atraumatic.     Nose: Nose normal. No congestion or rhinorrhea.     Mouth/Throat:     Mouth: Mucous membranes are moist.     Pharynx: No oropharyngeal exudate or posterior oropharyngeal erythema.  Eyes:     General: No scleral icterus.    Extraocular Movements: Extraocular movements intact.  Cardiovascular:     Rate and Rhythm: Normal rate and regular rhythm.     Pulses: Normal pulses.     Heart sounds: No murmur.  Pulmonary:     Effort: Pulmonary effort is normal. No respiratory distress.     Breath sounds: Normal breath sounds. No wheezing, rhonchi or rales.  Chest:     Chest wall: No tenderness.  Abdominal:     General: Abdomen is flat.     Palpations: Abdomen is soft.     Tenderness: There is no abdominal tenderness. There is no right CVA tenderness, left CVA tenderness, guarding or rebound.  Musculoskeletal:        General: No tenderness.     Cervical back: Neck supple. No tenderness.  Skin:    General: Skin is warm and dry.     Capillary Refill: Capillary refill takes less than 2 seconds.   Neurological:     General: No focal deficit present.     Mental Status: She is alert and oriented to person, place, and time.     Cranial Nerves: No cranial nerve deficit.     Sensory: No sensory deficit.     Motor: No weakness.  Psychiatric:        Mood and Affect: Mood normal.     ED Results / Procedures / Treatments   Labs (all labs ordered are listed, but only abnormal results are displayed) Labs Reviewed  CBC WITH DIFFERENTIAL/PLATELET - Abnormal; Notable for the  following components:      Result Value   RDW 17.1 (*)    Platelets 79 (*)    All other components within normal limits  COMPREHENSIVE METABOLIC PANEL - Abnormal; Notable for the following components:   Potassium 3.4 (*)    Glucose, Bld 100 (*)    Calcium 8.6 (*)    Albumin 3.4 (*)    All other components within normal limits    EKG None  Radiology No results found.  Procedures Procedures (including critical care time)  Medications Ordered in ED Medications  prednisoLONE acetate (PRED FORTE) 1 % ophthalmic suspension 1 drop (1 drop Right Eye Given 05/31/20 0958)  tetracaine (PONTOCAINE) 0.5 % ophthalmic solution 2 drop (2 drops Right Eye Given by Other 05/31/20 0901)  fluorescein ophthalmic strip 1 strip (1 strip Right Eye Given 05/31/20 0901)    ED Course  I have reviewed the triage vital signs and the nursing notes.  Pertinent labs & imaging results that were available during my care of the patient were reviewed by me and considered in my medical decision making (see chart for details).    MDM Rules/Calculators/A&P                      Bailey Sheppard is a 39 y.o. female with a past medical history significant for ITP, hypertension, hypercholesterolemia, and report of prior episcleritis who presents with right redness and eye pain since yesterday.  Patient reports extreme photophobia and pain in her right eye.  She reports the pain is a 9 out of 10 in severity especially with light.  She reports she  took her contact out and is having very her vision with the right eye.  She says that this happened several years ago when she first had ITP.  She was recently admitted several weeks ago for ITP when her platelets got down to 7000 and she was having some bruising and bleeding.  She says that she got IVIG on 510 and 511, is still on a steroid taper, and is having Rituxan injections every Friday.  She reports that she is not having any other symptoms today including no other bruising, chest pain, shortness of breath, fevers, chills, headache, neck pain, neck stiffness, nausea, vomiting, urinary symptoms, or GI symptoms.  On exam, patient does have severe photophobia.  Patient also have consensual photophobia with right eye pain when the light shined in the left eye.  Patient had a fluorescein and Wood's lamp exam showing no evidence of corneal abrasion or ulcer.  Pressure was 14 in the right eye checked 2 times.  Patient reports the pain in the eye did improve slightly from a 9 to a 6 out of 10 in severity after the tetracaine drops.  A slit lamp was utilized and I did not see evidence of hyphema.  Patient did have intact visual fields and she did not have her contact in the right eye so was difficult to assess visual acuity but she subjectively reported it was more blurry on the right side compared to the left.  Otherwise, lungs clear chest nontender.  Abdomen nontender.  No focal neurologic deficits.  Patient resting comfortably in the dark.  We will touch base with ophthalmology as this does seem somewhat similar to when the patient reports she had episcleritis related to prior ITP and associated treatments however the consensual photophobia does make me somewhat more concerned about an iritis.  We will get some screening labs to make  sure there is not acute worsening of her ITP but will see what ophthalmology recommends.  9:35 AM Just spoke to Dr. Carolyn Stare on the phone with ophthalmology.  She was able to  look the patient chart.  She feels that the patient is safe for discharge home with a plan to use topical prednisolone 1% drops to use 1 drop 4 times a day and leave her contact out.  She will see her in clinic at 8:30 AM tomorrow.  When labs have returned if they are reassuring, anticipate discharge home for this plan.  Spoke with oncology who looked through her chart.  They feel that the platelet drop was overall expected and they feel she is safe for discharge.  They did not recommend any other management at this time.  They agree with the ophthalmology follow-up tomorrow and plan with the drops and they will see her on Friday.  She understood return precautions and plan of care and was discharged in good condition.   Final Clinical Impression(s) / ED Diagnoses Final diagnoses:  Acute right eye pain  Eye redness    Rx / DC Orders ED Discharge Orders    None     Clinical Impression: 1. Acute right eye pain   2. Eye redness     Disposition: Discharge  Condition: Good  I have discussed the results, Dx and Tx plan with the pt(& family if present). He/she/they expressed understanding and agree(s) with the plan. Discharge instructions discussed at great length. Strict return precautions discussed and pt &/or family have verbalized understanding of the instructions. No further questions at time of discharge.    New Prescriptions   No medications on file    Follow Up: Luberta Mutter, MD [address] [place] [address] [phone]  In 1 day 8:30 am tomorrow morning.     Abie Killian, Gwenyth Allegra, MD 05/31/20 1124

## 2020-05-31 NOTE — Progress Notes (Signed)
Entries was seen in the emergency room today with episcleritis.  She is being referred to ophthalmology and started on prednisolone eyedrops.  Her platelets have dropped further to 79.  I am adding IVIG for her to receive together with rituximab this coming Friday.

## 2020-05-31 NOTE — Discharge Instructions (Signed)
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  Your exam was overall consistent with this and when I spoke to ophthalmology, they agreed and want to see you in clinic tomorrow at 8:30 AM.  They would like you to take the prednisolone drops 4 times a day in the right eye 1 drop each time.  They also recommend shaking it beforehand.  She recommends you not use your contact lens on the right eye and see them in clinic tomorrow morning.  Your labs showed your platelets are downtrending but we spoke to the oncology team who felt this was expected and would like to have you seen on Friday as previously scheduled.  If any symptoms change or worsen before then, please return to nearest emergency department.  Please rest and stay hydrated.

## 2020-05-31 NOTE — ED Notes (Signed)
MD made aware of elevated BP. Patient instructed that if she has any severe headaches or vision problems differing from the current difficulty to return to ER or call her PCP. Patient reports she took a dose of her BP medication and will monitor her condition.

## 2020-06-02 ENCOUNTER — Telehealth: Payer: Self-pay | Admitting: Oncology

## 2020-06-02 NOTE — Telephone Encounter (Signed)
R/s appt oer 6/2 sch message to fit IVG - unable to reach pt and vmail full - message sent to RN to try to contact pt,.

## 2020-06-02 NOTE — Progress Notes (Signed)
[place]  [phone] [phone]     ID: Bailey Sheppard DOB: Oct 11, 1981  MR#: [id number]  SNK#:[id number]  Patient Care Team: Leamon Arnt, MD as PCP - General (Family Medicine) Bailey Sheppard, Bailey Dad, MD as Consulting Physician (Hematology and Oncology) Yisroel Ramming, Bailey All, MD as Consulting Physician (Obstetrics and Gynecology) Chauncey Cruel, MD OTHER MD:  CHIEF COMPLAINT: Immune thrombocytopenia  CURRENT TREATMENT:  [IVIG]; rituximab   INTERVAL HISTORY: Bailey Sheppard returns today for follow up of her immune thrombocytopenia.   She continues on weekly rituximab. Today is week 4.  She has tolerated this so far with no side effects whatsoever.  It is affecting her work schedule but she tells me they "are being patient with me".  She is now off Decadron.  She did not notice any significant change going off the medication  Since her last visit, Bailey Sheppard was seen in the emergency room on 05/31/2020 with episcleritis.  She was referred to ophthalmology and started on prednisolone eyedrops.  The eye has cleared nicely over the last 2 days.  Her platelets 3 days ago were 79.  I added IVIG for her to receive together with rituximab today but on repeat today her platelet count is 97,000.  I am canceling the IVIG.  She is continuing with rituximab as planned   REVIEW OF SYSTEMS: Bailey Sheppard is not exercising regularly.  She has an excellent functional status.  She has not received the Covid vaccine and she is unlikely to get any response from it while actively receiving rituximab.  There have been no other intercurrent developments.  A detailed review of systems today was stable   HISTORY OF CURRENT ILLNESS: From the May 2021 intake note:  I last saw Bailey Sheppard in 2019.  At that time she continued in remission from her ITP. She has been stable off treatment until this weekend, 05/07/2020, when she developed a bruise on her leg. She then had blood on  her toothbrush. Finally she developed petechiae and "that's when I started paying attention," as she has had them before. She called her primary MD Dr Jonni Sanger who checked a CBC showing a platelet count of <10K with otherwise normal counts. The patient called our office and was directed to the ED.  I met with Bailey Sheppard in the emergency room 05/10/2020.  At that time her platelet count was 7000.  We started her on steroids and IVIG and by the next day her platelets were just over 20,000.  She had a very rapid recovery, received 2 doses of IVIG total, and was discharged on 8 mg of dexamethasone twice daily.  She started on weekly rituximab and her steroid taper on 05/13/2020.  Going back 2 weeks she is not aware of any intercurrent infections or change in medications.  Her 24 year old son is a Associate Professor and they have traveled to [place] in [place] in the last 2 weekends.  They will had some sinus issues but no fever or symptoms of flu.  She has not received a coronavirus vaccine.  The patient's subsequent history is as detailed below.   PAST MEDICAL HISTORY: Past Medical History:  Diagnosis Date  . Acute pericarditis, unspecified 06/11/2013  . Anemia   . Anxiety   . Chronic hypertension during pregnancy 09/17/2018  . Depression   . Elevated hemoglobin A1c 2018   5.7  . Fibroids   . Hypertension   . ITP (idiopathic thrombocytopenic purpura)   . Obesity   . Vaginal  Pap smear, abnormal     PAST SURGICAL HISTORY: Past Surgical History:  Procedure Laterality Date  . INTRAUTERINE DEVICE (IUD) INSERTION  04/2012  . UMBILICAL HERNIA REPAIR      FAMILY HISTORY Family History  Problem Relation Age of Onset  . Hypertension Mother   . Diabetes Father   . Kidney failure Father   . Heart failure Father   . Diabetes Brother   . Lupus Cousin   . Diabetes Maternal Grandfather   . Kidney failure Maternal Grandfather   . Diabetes Paternal Grandmother     GYNECOLOGIC HISTORY:  G3 P3,  Menarche age 70. Age of first live birth: 77.    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  Her husband was a Software engineer at a private school but passed away suddenly, at home, from a presumed myocardial infarction, February 2021.  (His family has a strong history of heart attacks and strokes).. Their 3 sons  are currently 24, 69, and less than 28 years old    ADVANCED DIRECTIVES: To be discussed   HEALTH MAINTENANCE: Social History   Tobacco Use  . Smoking status: Never Smoker  . Smokeless tobacco: Never Used  Substance Use Topics  . Alcohol use: No  . Drug use: No      Allergies  Allergen Reactions  . Nsaids     ITP    Current Outpatient Medications  Medication Sig Dispense Refill  . acetaminophen (TYLENOL) 500 MG tablet Take 500 mg by mouth every 6 (six) hours as needed for headache.    . diltiazem (CARDIZEM CD) 240 MG 24 hr capsule Take 1 capsule (240 mg total) by mouth daily. 90 capsule 3  . hydrochlorothiazide (HYDRODIURIL) 25 MG tablet Take 1 tablet (25 mg total) by mouth daily. 90 tablet 3  . levonorgestrel (MIRENA) 20 MCG/24HR IUD 1 each by Intrauterine route once.    Marland Kitchen MELATONIN GUMMIES PO Take 1 each by mouth at bedtime as needed (sleep).    . Multiple Vitamin (MULTIVITAMIN WITH MINERALS) TABS tablet Take 1 tablet by mouth daily.    . traMADol (ULTRAM) 50 MG tablet Take 50 mg by mouth every 6 (six) hours as needed for moderate pain.     No current facility-administered medications for this visit.    OBJECTIVE: African-American woman examined in the treatment bed  There were no vitals filed for this visit.   There is no height or weight on file to calculate BMI.   Wt Readings from Last 3 Encounters:  05/31/20 290 lb (131.5 kg)  05/27/20 295 lb (133.8 kg)  05/20/20 293 lb 14.4 oz (133.3 kg)  For vitals on 06/03/2020 please see the infusion area flowsheet   ECOG FS:1 - Symptomatic but  completely ambulatory  Sclerae unicteric, EOMs intact Wearing a mask Lungs no rales or rhonchi Heart regular rate and rhythm Abd soft, nontender, positive bowel sounds Neuro: nonfocal, well oriented, appropriate affect   LAB RESULTS:  CMP     Component Value Date/Time   NA 136 05/31/2020 0927   NA 140 09/13/2014 1400   K 3.4 (L) 05/31/2020 0927   K 3.7 09/13/2014 1400   CL 99 05/31/2020 0927   CL 105 06/19/2013 0845   CO2 26 05/31/2020 0927   CO2 24 09/13/2014 1400   GLUCOSE 100 (H) 05/31/2020 0927   GLUCOSE 110 09/13/2014 1400   GLUCOSE 97 06/19/2013 0845   BUN 11 05/31/2020 0927  BUN 9.7 09/13/2014 1400   CREATININE 0.69 05/31/2020 0927   CREATININE 0.88 05/13/2020 0755   CREATININE 0.76 03/06/2019 1440   CREATININE 0.8 09/13/2014 1400   CALCIUM 8.6 (L) 05/31/2020 0927   CALCIUM 9.1 09/13/2014 1400   PROT 7.0 05/31/2020 0927   PROT 7.1 09/13/2014 1400   ALBUMIN 3.4 (L) 05/31/2020 0927   ALBUMIN 4.0 09/13/2014 1400   AST 20 05/31/2020 0927   AST 42 (H) 05/13/2020 0755   AST 14 09/13/2014 1400   ALT 31 05/31/2020 0927   ALT 73 (H) 05/13/2020 0755   ALT 13 09/13/2014 1400   ALKPHOS 42 05/31/2020 0927   ALKPHOS 53 09/13/2014 1400   BILITOT 1.1 05/31/2020 0927   BILITOT 1.0 05/13/2020 0755   BILITOT 0.95 09/13/2014 1400   GFRNONAA >60 05/31/2020 0927   GFRNONAA >60 05/13/2020 0755   GFRAA >60 05/31/2020 0927   GFRAA >60 05/13/2020 0755    No results found for: TOTALPROTELP, ALBUMINELP, A1GS, A2GS, BETS, BETA2SER, GAMS, MSPIKE, SPEI  No results found for: KPAFRELGTCHN, LAMBDASER, KAPLAMBRATIO  Lab Results  Component Value Date   WBC 4.3 06/03/2020   NEUTROABS 2.8 06/03/2020   HGB 12.3 06/03/2020   HCT 36.7 06/03/2020   MCV 84.4 06/03/2020   PLT 97 (L) 06/03/2020   No results found for: LABCA2  No components found for: ZCHYIF027  No results for input(s): INR in the last 168 hours.  No results found for: LABCA2  No results found for: XAJ287  No  results found for: OMV672  No results found for: CNO709  No results found for: CA2729  No components found for: HGQUANT  No results found for: CEA1 / No results found for: CEA1   No results found for: AFPTUMOR  No results found for: [facility]  Appointment on 06/03/2020  Component Date Value Ref Range Status  . WBC 06/03/2020 4.3  4.0 - 10.5 K/uL Final  . RBC 06/03/2020 4.35  3.87 - 5.11 MIL/uL Final  . Hemoglobin 06/03/2020 12.3  12.0 - 15.0 g/dL Final  . HCT 06/03/2020 36.7  36.0 - 46.0 % Final  . MCV 06/03/2020 84.4  80.0 - 100.0 fL Final  . MCH 06/03/2020 28.3  26.0 - 34.0 pg Final  . MCHC 06/03/2020 33.5  30.0 - 36.0 g/dL Final  . RDW 06/03/2020 16.1* 11.5 - 15.5 % Final  . Platelets 06/03/2020 97* 150 - 400 K/uL Final  . nRBC 06/03/2020 0.0  0.0 - 0.2 % Final  . Neutrophils Relative % 06/03/2020 65  % Final  . Neutro Abs 06/03/2020 2.8  1.7 - 7.7 K/uL Final  . Lymphocytes Relative 06/03/2020 18  % Final  . Lymphs Abs 06/03/2020 0.8  0.7 - 4.0 K/uL Final  . Monocytes Relative 06/03/2020 11  % Final  . Monocytes Absolute 06/03/2020 0.5  0.1 - 1.0 K/uL Final  . Eosinophils Relative 06/03/2020 5  % Final  . Eosinophils Absolute 06/03/2020 0.2  0.0 - 0.5 K/uL Final  . Basophils Relative 06/03/2020 1  % Final  . Basophils Absolute 06/03/2020 0.0  0.0 - 0.1 K/uL Final  . Immature Granulocytes 06/03/2020 0  % Final  . Abs Immature Granulocytes 06/03/2020 0.01  0.00 - 0.07 K/uL Final   Performed at [facility], [place] [address], [place], [place] [address]    (this displays the last labs from the last 3 days)  No results found for: HGBA, HGBA2QUANT, HGBFQUANT, HGBSQUAN (Hemoglobinopathy evaluation)   Lab Results  Component Value  Date   LDH 159 07/14/2018    Lab Results  Component Value Date   IRON 49 04/25/2012   TIBC 391 04/25/2012   IRONPCTSAT 13 (L) 04/25/2012   (Iron and TIBC)  Lab Results  Component Value Date   FERRITIN 29  12/02/2018    Urinalysis    Component Value Date/Time   COLORURINE YELLOW 01/21/2014 2018   APPEARANCEUR CLEAR 01/21/2014 2018   LABSPEC 1.022 01/21/2014 2018   LABSPEC 1.005 06/19/2013 1127   PHURINE 6.0 01/21/2014 2018   GLUCOSEU NEGATIVE 01/21/2014 2018   GLUCOSEU Negative 06/19/2013 1127   HGBUR NEGATIVE 01/21/2014 2018   [place] NEGATIVE 01/21/2014 2018   BILIRUBINUR Negative 06/19/2013 1127   [place] 01/21/2014 2018   PROTEINUR NEGATIVE 01/21/2014 2018   UROBILINOGEN 0.2 01/21/2014 2018   UROBILINOGEN 0.2 06/19/2013 1127   NITRITE NEGATIVE 01/21/2014 2018   LEUKOCYTESUR NEGATIVE 01/21/2014 2018   LEUKOCYTESUR Small 06/19/2013 1127    STUDIES: No results found.   ELIGIBLE FOR AVAILABLE RESEARCH PROTOCOL: no  ASSESSMENT: 39 y.o. [place], [place] woman:  1.  ITP, requiring hospitalization in June 2014. Patient was treated with IV dexamethasone and IVIG. She was then on a dexamethasone taper between 06/22/2013 and 07/12/2013. She received 6 weekly doses of rituximab, followed by 2 q. three-week doses, with the final dose given on 08/21/2013.  2. History of iron deficiency anemia,             (a) IV Feraheme was given in March 2013.             (b) received repeat IV Feraheme on 07/17/2018 and 07/24/2018 for a ferritin less than 6  3. Acute pericarditis - resolved   4.  Pregnancy complicated by iron deficiency and thrombocytopenia: Resolved  5.  Recurrent ITP 05/09/2020: platelet count 7000  (a) dexamethasone started 05/09/2020, on taper to off  (b) IVIG given 05/09/2020 and 05/10/2020  (c) rituximab started 05/13/2020   (i) negative hepatitis B surface antigen August 2018 and core antibody June 2014   (ii) repeat hepatitis B studies 05/20/2020 -   PLAN: Bailey Sheppard had a moderate drop in her platelet count as the effect of the IVIG wore off, but her platelets remain safely above 50,000.  Accordingly we are not adding IVIG today.  We are  continuing rituximab weekly.  She will receive it today next week and then again 2 weeks from now.  I will see her at that point.  Depending on counts we may consider starting to broadening the right toxin Mab to every 2 weeks.  Total encounter time 20 minutes.Chauncey Cruel, MD   06/03/2020 9:09 AM Medical Oncology and Hematology [facility] [place], [place] [address] Tel. [phone]    Fax. [phone]   I, Wilburn Mylar, am acting as scribe for Dr. Virgie Sheppard. Anea Fodera.  I, Lurline Del MD, have reviewed the above documentation for accuracy and completeness, and I agree with the above.   *Total Encounter Time as defined by the Centers for Medicare and Medicaid Services includes, in addition to the face-to-face time of a patient visit (documented in the note above) non-face-to-face time: obtaining and reviewing outside history, ordering and reviewing medications, tests or procedures, care coordination (communications with other health care professionals or caregivers) and documentation in the medical record.

## 2020-06-03 ENCOUNTER — Inpatient Hospital Stay: Payer: No Typology Code available for payment source

## 2020-06-03 ENCOUNTER — Inpatient Hospital Stay: Payer: No Typology Code available for payment source | Attending: Oncology

## 2020-06-03 ENCOUNTER — Inpatient Hospital Stay (HOSPITAL_BASED_OUTPATIENT_CLINIC_OR_DEPARTMENT_OTHER): Payer: No Typology Code available for payment source | Admitting: Oncology

## 2020-06-03 ENCOUNTER — Other Ambulatory Visit: Payer: Self-pay

## 2020-06-03 VITALS — BP 134/93 | HR 96 | Temp 98.0°F | Resp 17

## 2020-06-03 DIAGNOSIS — D693 Immune thrombocytopenic purpura: Secondary | ICD-10-CM | POA: Diagnosis not present

## 2020-06-03 DIAGNOSIS — Z5112 Encounter for antineoplastic immunotherapy: Secondary | ICD-10-CM | POA: Diagnosis present

## 2020-06-03 DIAGNOSIS — I309 Acute pericarditis, unspecified: Secondary | ICD-10-CM | POA: Insufficient documentation

## 2020-06-03 DIAGNOSIS — F418 Other specified anxiety disorders: Secondary | ICD-10-CM | POA: Insufficient documentation

## 2020-06-03 DIAGNOSIS — Z79899 Other long term (current) drug therapy: Secondary | ICD-10-CM | POA: Diagnosis not present

## 2020-06-03 DIAGNOSIS — R21 Rash and other nonspecific skin eruption: Secondary | ICD-10-CM | POA: Diagnosis not present

## 2020-06-03 DIAGNOSIS — H15109 Unspecified episcleritis, unspecified eye: Secondary | ICD-10-CM | POA: Diagnosis not present

## 2020-06-03 DIAGNOSIS — I10 Essential (primary) hypertension: Secondary | ICD-10-CM | POA: Insufficient documentation

## 2020-06-03 DIAGNOSIS — E669 Obesity, unspecified: Secondary | ICD-10-CM | POA: Insufficient documentation

## 2020-06-03 DIAGNOSIS — D509 Iron deficiency anemia, unspecified: Secondary | ICD-10-CM | POA: Diagnosis not present

## 2020-06-03 LAB — CBC WITH DIFFERENTIAL/PLATELET
Abs Immature Granulocytes: 0.01 10*3/uL (ref 0.00–0.07)
Basophils Absolute: 0 10*3/uL (ref 0.0–0.1)
Basophils Relative: 1 %
Eosinophils Absolute: 0.2 10*3/uL (ref 0.0–0.5)
Eosinophils Relative: 5 %
HCT: 36.7 % (ref 36.0–46.0)
Hemoglobin: 12.3 g/dL (ref 12.0–15.0)
Immature Granulocytes: 0 %
Lymphocytes Relative: 18 %
Lymphs Abs: 0.8 10*3/uL (ref 0.7–4.0)
MCH: 28.3 pg (ref 26.0–34.0)
MCHC: 33.5 g/dL (ref 30.0–36.0)
MCV: 84.4 fL (ref 80.0–100.0)
Monocytes Absolute: 0.5 10*3/uL (ref 0.1–1.0)
Monocytes Relative: 11 %
Neutro Abs: 2.8 10*3/uL (ref 1.7–7.7)
Neutrophils Relative %: 65 %
Platelets: 97 10*3/uL — ABNORMAL LOW (ref 150–400)
RBC: 4.35 MIL/uL (ref 3.87–5.11)
RDW: 16.1 % — ABNORMAL HIGH (ref 11.5–15.5)
WBC: 4.3 10*3/uL (ref 4.0–10.5)
nRBC: 0 % (ref 0.0–0.2)

## 2020-06-03 MED ORDER — SODIUM CHLORIDE 0.9 % IV SOLN
375.0000 mg/m2 | Freq: Once | INTRAVENOUS | Status: AC
  Administered 2020-06-03: 1000 mg via INTRAVENOUS
  Filled 2020-06-03: qty 100

## 2020-06-03 MED ORDER — ACETAMINOPHEN 325 MG PO TABS
ORAL_TABLET | ORAL | Status: AC
  Filled 2020-06-03: qty 2

## 2020-06-03 MED ORDER — DIPHENHYDRAMINE HCL 25 MG PO CAPS
ORAL_CAPSULE | ORAL | Status: AC
  Filled 2020-06-03: qty 2

## 2020-06-03 MED ORDER — SODIUM CHLORIDE 0.9 % IV SOLN
Freq: Once | INTRAVENOUS | Status: AC
  Filled 2020-06-03: qty 250

## 2020-06-03 MED ORDER — ACETAMINOPHEN 325 MG PO TABS
650.0000 mg | ORAL_TABLET | Freq: Once | ORAL | Status: AC
  Administered 2020-06-03: 650 mg via ORAL

## 2020-06-03 MED ORDER — DIPHENHYDRAMINE HCL 25 MG PO CAPS
25.0000 mg | ORAL_CAPSULE | Freq: Once | ORAL | Status: AC
  Administered 2020-06-03: 25 mg via ORAL

## 2020-06-03 NOTE — Patient Instructions (Addendum)
[facility] °Discharge Instructions for Patients Receiving Chemotherapy ° °Today you received the following chemotherapy agents: Rituximab ° °To help prevent nausea and vomiting after your treatment, we encourage you to take your nausea medication as directed.  °  °If you develop nausea and vomiting that is not controlled by your nausea medication, call the clinic.  ° °BELOW ARE SYMPTOMS THAT SHOULD BE REPORTED IMMEDIATELY: °· *FEVER GREATER THAN 100.5 F °· *CHILLS WITH OR WITHOUT FEVER °· NAUSEA AND VOMITING THAT IS NOT CONTROLLED WITH YOUR NAUSEA MEDICATION °· *UNUSUAL SHORTNESS OF BREATH °· *UNUSUAL BRUISING OR BLEEDING °· TENDERNESS IN MOUTH AND THROAT WITH OR WITHOUT PRESENCE OF ULCERS °· *URINARY PROBLEMS °· *BOWEL PROBLEMS °· UNUSUAL RASH °Items with * indicate a potential emergency and should be followed up as soon as possible. ° °Feel free to call the clinic should you have any questions or concerns. The clinic phone number is [phone]. ° °Please show the CHEMO ALERT CARD at check-in to the Emergency Department and triage nurse. ° ° °

## 2020-06-03 NOTE — Progress Notes (Signed)
Dr. Jana Hakim saw patient in infusion room.  Per Dr. Jana Hakim, patient will be receiving Rituximab only.

## 2020-06-06 ENCOUNTER — Telehealth: Payer: Self-pay | Admitting: Oncology

## 2020-06-06 NOTE — Telephone Encounter (Signed)
No 6/4 los. No changes made to pt's schedule.  

## 2020-06-07 ENCOUNTER — Inpatient Hospital Stay (HOSPITAL_BASED_OUTPATIENT_CLINIC_OR_DEPARTMENT_OTHER): Payer: No Typology Code available for payment source | Admitting: Medical

## 2020-06-07 ENCOUNTER — Other Ambulatory Visit: Payer: Self-pay

## 2020-06-07 ENCOUNTER — Telehealth: Payer: Self-pay

## 2020-06-07 ENCOUNTER — Ambulatory Visit (HOSPITAL_COMMUNITY)
Admission: RE | Admit: 2020-06-07 | Discharge: 2020-06-07 | Disposition: A | Discharge status: Home / Self Care | Payer: No Typology Code available for payment source | Source: Ambulatory Visit | Attending: Medical | Admitting: Medical

## 2020-06-07 VITALS — BP 106/72 | HR 99 | Temp 98.9°F | Resp 18 | Wt 298.5 lb

## 2020-06-07 DIAGNOSIS — M25561 Pain in right knee: Secondary | ICD-10-CM

## 2020-06-07 DIAGNOSIS — D693 Immune thrombocytopenic purpura: Secondary | ICD-10-CM

## 2020-06-07 DIAGNOSIS — M25562 Pain in left knee: Secondary | ICD-10-CM | POA: Insufficient documentation

## 2020-06-07 DIAGNOSIS — Z5112 Encounter for antineoplastic immunotherapy: Secondary | ICD-10-CM | POA: Diagnosis not present

## 2020-06-07 NOTE — Telephone Encounter (Signed)
Pt called and states she is having new leg discomfort/weakness. Pt states she is having a hard time moving her knees after sitting for long period of time, and feels like she is "having to learn to walk again." Per Dr Jana Hakim, pt should be seen in [facility] for further evaluation. Pt understands she has appt for 1200 and states she is able to drive herself safely.

## 2020-06-08 ENCOUNTER — Other Ambulatory Visit: Payer: Self-pay | Admitting: Medical

## 2020-06-08 DIAGNOSIS — M25561 Pain in right knee: Secondary | ICD-10-CM

## 2020-06-09 NOTE — Progress Notes (Signed)
[facility] Progress Note   Bailey Sheppard [id number] Aug 08, 1981 39 y.o.  Bailey Sheppard is managed by Dr. Jana Hakim  Actively treated with chemotherapy/immunotherapy/hormonal therapy: The patient is being treated with Ruxience for ITP.  Current therapy: Ruxience  Last treated: 06/03/2020 (cycle 4)  Next scheduled appointment with provider: 06/17/2020  Assessment: Plan:    Acute bilateral knee pain - Plan: DG Knee 3 Views Left, DG Knee 3 Views Right  Idiopathic thrombocytopenic purpura (ITP) (HCC)   Acute bilateral knee pain: The patient was referred for bilateral knee x-rays which returned showing no pathology.  She was referred to orthopedics for evaluation.  ITP: The patient continues to be managed by Dr. Jana Hakim and is treated with Ruxience.  She is status post cycle 4 which was dosed on 06/03/2020.  She is scheduled to see Dr. Jana Hakim in follow-up on 06/17/2020.  Please see After Visit Summary for patient specific instructions.  Future Appointments  Date Time Provider [place]  06/10/2020  8:15 AM [facility] LAB 6 [facility] None  06/10/2020  9:15 AM [facility] INFUSION [facility] None  06/14/2020  8:30 AM Leandrew Koyanagi, MD [facility] None  06/17/2020  8:00 AM [facility] LAB 3 [facility] None  06/17/2020  8:30 AM Magrinat, Virgie Dad, MD [facility] None  06/17/2020  9:30 AM [facility] INFUSION [facility] None  06/24/2020  9:00 AM [facility] LAB 6 [facility] None  06/24/2020  9:45 AM [facility] INFUSION [facility] None  07/01/2020  9:00 AM [facility] LAB 4 [facility] None  07/01/2020  9:45 AM [facility] INFUSION [facility] None  07/08/2020  9:00 AM [facility] LAB 4 [facility] None  07/08/2020  9:45 AM [facility] INFUSION [facility] None  11/08/2020  8:30 AM Leamon Arnt, MD [facility]    Orders Placed This Encounter  Procedures  . DG Knee 3 Views Left  . DG Knee 3 Views Right       Subjective:   Patient ID:  Bailey Sheppard is a 39 y.o. (DOB 12/26/1981) female.  Chief Complaint: No chief complaint on file.   HPI Bailey Sheppard  is a 39 y.o. female with a diagnosis of ITP.  She is followed by Dr. Jana Hakim and is treated with Ruxience.  She received cycle 4 on 06/03/2020.  Prior to Ruxience she was on steroids.  Since stopping her steroid she has developed bilateral knee pain.  She reports that it is difficult getting from a sitting to standing position due to her knee pain.  She denies any changes in activity or trauma.  She is concerned that her bilateral knee pain may be secondary to Ruxience.   Medications: I have reviewed the patient's current medications.  Allergies:  Allergies  Allergen Reactions  . Nsaids     ITP    Past Medical History:  Diagnosis Date  . Acute pericarditis, unspecified 06/11/2013  . Anemia   . Anxiety   . Chronic hypertension during pregnancy 09/17/2018  . Depression   . Elevated hemoglobin A1c 2018   5.7  . Fibroids   . Hypertension   . ITP (idiopathic thrombocytopenic purpura)   . Obesity   . Vaginal Pap smear, abnormal     Past Surgical History:  Procedure Laterality Date  . INTRAUTERINE DEVICE (IUD) INSERTION  04/2012  . UMBILICAL HERNIA REPAIR      Family History  Problem Relation Age of Onset  . Hypertension Mother   . Diabetes Father   . Kidney failure Father   . Heart failure Father   . Diabetes  Brother   . Lupus Cousin   . Diabetes Maternal Grandfather   . Kidney failure Maternal Grandfather   . Diabetes Paternal Grandmother     Social History   Socioeconomic History  . Marital status: Soil scientist    Spouse name: donnell  . Number of children: 3  . Years of education: Not on file  . Highest education level: Not on file  Occupational History  . Occupation: Estate manager/land agent.    Employer: [employer]  Tobacco Use  . Smoking status: Never Smoker  . Smokeless tobacco: Never Used  Vaping Use  . Vaping Use: Never used    Substance and Sexual Activity  . Alcohol use: No  . Drug use: No  . Sexual activity: Yes    Partners: Male    Birth control/protection: I.U.D.  Other Topics Concern  . Not on file  Social History Narrative   Has 3 children   Social Determinants of Health   Financial Resource Strain:   . Difficulty of Paying Living Expenses:   Food Insecurity:   . Worried About Charity fundraiser in the Last Year:   . Arboriculturist in the Last Year:   Transportation Needs:   . Film/video editor (Medical):   Marland Kitchen Lack of Transportation (Non-Medical):   Physical Activity:   . Days of Exercise per Week:   . Minutes of Exercise per Session:   Stress:   . Feeling of Stress :   Social Connections:   . Frequency of Communication with Friends and Family:   . Frequency of Social Gatherings with Friends and Family:   . Attends Religious Services:   . Active Member of Clubs or Organizations:   . Attends Archivist Meetings:   Marland Kitchen Marital Status:   Intimate Partner Violence:   . Fear of Current or Ex-Partner:   . Emotionally Abused:   Marland Kitchen Physically Abused:   . Sexually Abused:     Past Medical History, Surgical history, Social history, and Family history were reviewed and updated as appropriate.   Please see review of systems for further details on the patient's review from today.   Review of Systems:  Review of Systems  Constitutional: Negative for chills, diaphoresis and fever.  HENT: Negative for trouble swallowing and voice change.   Respiratory: Negative for cough, chest tightness, shortness of breath and wheezing.   Cardiovascular: Negative for chest pain and palpitations.  Gastrointestinal: Negative for abdominal pain, constipation, diarrhea, nausea and vomiting.  Musculoskeletal: Positive for arthralgias. Negative for back pain and myalgias.  Neurological: Negative for dizziness, light-headedness and headaches.    Objective:   Physical Exam:  BP 106/72 (BP Location:  Left Arm, Patient Position: Sitting)   Pulse 99   Temp 98.9 F (37.2 C) (Oral)   Resp 18   Wt 135.4 kg (298 lb 8 oz)   SpO2 99%   BMI 42.83 kg/m  ECOG: 1  Physical Exam Constitutional:      General: She is not in acute distress.    Appearance: Normal appearance. She is not ill-appearing, toxic-appearing or diaphoretic.  HENT:     Head: Normocephalic and atraumatic.  Musculoskeletal:        General: No swelling, tenderness, deformity or signs of injury.     Right lower leg: No edema.     Left lower leg: No edema.     Comments: No effusion or crepitus was noted.  Full range of motion was appreciated.  Skin:  General: Skin is warm and dry.     Coloration: Skin is not jaundiced or pale.     Findings: No bruising, erythema, lesion or rash.  Neurological:     Mental Status: She is alert.     Gait: Gait abnormal.     Comments: The patient has difficulty arising from a seated position.  Psychiatric:        Mood and Affect: Mood normal.        Behavior: Behavior normal.        Thought Content: Thought content normal.        Judgment: Judgment normal.     Lab Review:     Component Value Date/Time   NA 136 05/31/2020 0927   NA 140 09/13/2014 1400   K 3.4 (L) 05/31/2020 0927   K 3.7 09/13/2014 1400   CL 99 05/31/2020 0927   CL 105 06/19/2013 0845   CO2 26 05/31/2020 0927   CO2 24 09/13/2014 1400   GLUCOSE 100 (H) 05/31/2020 0927   GLUCOSE 110 09/13/2014 1400   GLUCOSE 97 06/19/2013 0845   BUN 11 05/31/2020 0927   BUN 9.7 09/13/2014 1400   CREATININE 0.69 05/31/2020 0927   CREATININE 0.88 05/13/2020 0755   CREATININE 0.76 03/06/2019 1440   CREATININE 0.8 09/13/2014 1400   CALCIUM 8.6 (L) 05/31/2020 0927   CALCIUM 9.1 09/13/2014 1400   PROT 7.0 05/31/2020 0927   PROT 7.1 09/13/2014 1400   ALBUMIN 3.4 (L) 05/31/2020 0927   ALBUMIN 4.0 09/13/2014 1400   AST 20 05/31/2020 0927   AST 42 (H) 05/13/2020 0755   AST 14 09/13/2014 1400   ALT 31 05/31/2020 0927   ALT 73  (H) 05/13/2020 0755   ALT 13 09/13/2014 1400   ALKPHOS 42 05/31/2020 0927   ALKPHOS 53 09/13/2014 1400   BILITOT 1.1 05/31/2020 0927   BILITOT 1.0 05/13/2020 0755   BILITOT 0.95 09/13/2014 1400   GFRNONAA >60 05/31/2020 0927   GFRNONAA >60 05/13/2020 0755   GFRAA >60 05/31/2020 0927   GFRAA >60 05/13/2020 0755       Component Value Date/Time   WBC 4.3 06/03/2020 0837   RBC 4.35 06/03/2020 0837   HGB 12.3 06/03/2020 0837   HGB 12.0 05/13/2020 0755   HGB 12.6 09/13/2014 1359   HCT 36.7 06/03/2020 0837   HCT 37.0 09/13/2014 1359   PLT 97 (L) 06/03/2020 0837   PLT 232 05/13/2020 0755   PLT 215 09/13/2014 1359   MCV 84.4 06/03/2020 0837   MCV 83.3 09/13/2014 1359   MCH 28.3 06/03/2020 0837   MCHC 33.5 06/03/2020 0837   RDW 16.1 (H) 06/03/2020 0837   RDW 15.4 (H) 09/13/2014 1359   LYMPHSABS 0.8 06/03/2020 0837   LYMPHSABS 1.6 09/13/2014 1359   MONOABS 0.5 06/03/2020 0837   MONOABS 0.6 09/13/2014 1359   EOSABS 0.2 06/03/2020 0837   EOSABS 0.3 09/13/2014 1359   BASOSABS 0.0 06/03/2020 0837   BASOSABS 0.0 09/13/2014 1359   -------------------------------  Imaging from last 24 hours (if applicable):  Radiology interpretation: DG Knee 3 Views Left  Result Date: 06/07/2020 CLINICAL DATA:  Bilateral knee pain EXAM: LEFT KNEE - 3 VIEW; RIGHT KNEE - 3 VIEW COMPARISON:  None. FINDINGS: No evidence of fracture, dislocation, or joint effusion. No evidence of arthropathy or other focal bone abnormality. Soft tissues are unremarkable. IMPRESSION: No fracture or dislocation of the bilateral knees. Electronically Signed   By: Eddie Candle M.D.   On: 06/07/2020 13:47  DG Knee 3 Views Right  Result Date: 06/07/2020 CLINICAL DATA:  Bilateral knee pain EXAM: LEFT KNEE - 3 VIEW; RIGHT KNEE - 3 VIEW COMPARISON:  None. FINDINGS: No evidence of fracture, dislocation, or joint effusion. No evidence of arthropathy or other focal bone abnormality. Soft tissues are unremarkable. IMPRESSION: No  fracture or dislocation of the bilateral knees. Electronically Signed   By: Eddie Candle M.D.   On: 06/07/2020 13:47

## 2020-06-10 ENCOUNTER — Other Ambulatory Visit: Payer: Self-pay

## 2020-06-10 ENCOUNTER — Other Ambulatory Visit: Payer: Self-pay | Admitting: Oncology

## 2020-06-10 ENCOUNTER — Inpatient Hospital Stay: Payer: No Typology Code available for payment source

## 2020-06-10 VITALS — BP 113/71 | HR 87 | Temp 98.3°F | Resp 18 | Wt 297.0 lb

## 2020-06-10 DIAGNOSIS — D693 Immune thrombocytopenic purpura: Secondary | ICD-10-CM

## 2020-06-10 DIAGNOSIS — I10 Essential (primary) hypertension: Secondary | ICD-10-CM

## 2020-06-10 DIAGNOSIS — Z6841 Body Mass Index (BMI) 40.0 and over, adult: Secondary | ICD-10-CM

## 2020-06-10 DIAGNOSIS — Z5112 Encounter for antineoplastic immunotherapy: Secondary | ICD-10-CM | POA: Diagnosis not present

## 2020-06-10 LAB — CBC WITH DIFFERENTIAL/PLATELET
Abs Immature Granulocytes: 0.02 10*3/uL (ref 0.00–0.07)
Basophils Absolute: 0 10*3/uL (ref 0.0–0.1)
Basophils Relative: 0 %
Eosinophils Absolute: 0.3 10*3/uL (ref 0.0–0.5)
Eosinophils Relative: 6 %
HCT: 39.2 % (ref 36.0–46.0)
Hemoglobin: 13.4 g/dL (ref 12.0–15.0)
Immature Granulocytes: 0 %
Lymphocytes Relative: 19 %
Lymphs Abs: 0.9 10*3/uL (ref 0.7–4.0)
MCH: 28.3 pg (ref 26.0–34.0)
MCHC: 34.2 g/dL (ref 30.0–36.0)
MCV: 82.7 fL (ref 80.0–100.0)
Monocytes Absolute: 0.5 10*3/uL (ref 0.1–1.0)
Monocytes Relative: 11 %
Neutro Abs: 3 10*3/uL (ref 1.7–7.7)
Neutrophils Relative %: 64 %
Platelets: 332 10*3/uL (ref 150–400)
RBC: 4.74 MIL/uL (ref 3.87–5.11)
RDW: 15.5 % (ref 11.5–15.5)
WBC: 4.8 10*3/uL (ref 4.0–10.5)
nRBC: 0 % (ref 0.0–0.2)

## 2020-06-10 MED ORDER — ACETAMINOPHEN 325 MG PO TABS
ORAL_TABLET | ORAL | Status: AC
  Filled 2020-06-10: qty 2

## 2020-06-10 MED ORDER — ACETAMINOPHEN 325 MG PO TABS
650.0000 mg | ORAL_TABLET | Freq: Once | ORAL | Status: AC
  Administered 2020-06-10: 650 mg via ORAL

## 2020-06-10 MED ORDER — DIPHENHYDRAMINE HCL 25 MG PO CAPS
25.0000 mg | ORAL_CAPSULE | Freq: Once | ORAL | Status: AC
  Administered 2020-06-10: 25 mg via ORAL

## 2020-06-10 MED ORDER — SODIUM CHLORIDE 0.9 % IV SOLN
Freq: Once | INTRAVENOUS | Status: AC
  Filled 2020-06-10: qty 250

## 2020-06-10 MED ORDER — DIPHENHYDRAMINE HCL 25 MG PO CAPS
ORAL_CAPSULE | ORAL | Status: AC
  Filled 2020-06-10: qty 1

## 2020-06-10 MED ORDER — SODIUM CHLORIDE 0.9 % IV SOLN
375.0000 mg/m2 | Freq: Once | INTRAVENOUS | Status: AC
  Administered 2020-06-10: 1000 mg via INTRAVENOUS
  Filled 2020-06-10: qty 100

## 2020-06-10 NOTE — Patient Instructions (Signed)
[facility] °Discharge Instructions for Patients Receiving Chemotherapy ° °Today you received the following chemotherapy agents: Rituximab ° °To help prevent nausea and vomiting after your treatment, we encourage you to take your nausea medication as directed.  °  °If you develop nausea and vomiting that is not controlled by your nausea medication, call the clinic.  ° °BELOW ARE SYMPTOMS THAT SHOULD BE REPORTED IMMEDIATELY: °· *FEVER GREATER THAN 100.5 F °· *CHILLS WITH OR WITHOUT FEVER °· NAUSEA AND VOMITING THAT IS NOT CONTROLLED WITH YOUR NAUSEA MEDICATION °· *UNUSUAL SHORTNESS OF BREATH °· *UNUSUAL BRUISING OR BLEEDING °· TENDERNESS IN MOUTH AND THROAT WITH OR WITHOUT PRESENCE OF ULCERS °· *URINARY PROBLEMS °· *BOWEL PROBLEMS °· UNUSUAL RASH °Items with * indicate a potential emergency and should be followed up as soon as possible. ° °Feel free to call the clinic should you have any questions or concerns. The clinic phone number is [phone]. ° °Please show the CHEMO ALERT CARD at check-in to the Emergency Department and triage nurse. ° ° °

## 2020-06-14 ENCOUNTER — Ambulatory Visit: Payer: No Typology Code available for payment source | Admitting: Orthopaedic Surgery

## 2020-06-16 NOTE — Progress Notes (Signed)
Bailey Sheppard  [phone] [phone]     ID: Bailey Sheppard DOB: January 10, 1981  MR#: [id number]  CLE#:[id number]  Patient Care Team: Leamon Arnt, MD as PCP - General (Family Medicine) Magrinat, Virgie Dad, MD as Consulting Physician (Hematology and Oncology) Yisroel Ramming, Everardo All, MD as Consulting Physician (Obstetrics and Gynecology) Chauncey Cruel, MD OTHER MD:  CHIEF COMPLAINT: Immune thrombocytopenia  CURRENT TREATMENT:  [IVIG]; rituximab   INTERVAL HISTORY: Bailey Sheppard returns today for follow up of her immune thrombocytopenia.   She continues on weekly rituximab. Today is week 6.  She has had no acute side effects from this that she is aware of.  She has had an excellent response with normalization of her platelet count. Results for Bailey Sheppard (MRN [id number]) as of 06/17/2020 09:41  Ref. Range 05/27/2020 07:37 05/31/2020 09:27 06/03/2020 08:37 06/10/2020 08:50 06/17/2020 07:57  Platelets Latest Ref Range: 150 - 400 K/uL 149 (L) 79 (L) 97 (L) 332 350    REVIEW OF SYSTEMS: Bailey Sheppard tells me she is finally feeling normal.  Last week she did not feel well, with pain in her knees, pain in her back, nausea, vomiting and diarrhea.  She did not get tested for the Covid virus and recall she has not had a vaccine.  This week she is feeling better and all those problems have resolved.  She has a mild rash on the dorsum of both hands.  It is not itchy.  A detailed review of systems today was otherwise stable.   HISTORY OF CURRENT ILLNESS: From the May 2021 intake note:  I last saw [name] in 2019.  At that time she continued in remission from her ITP. She has been stable off treatment until this weekend, 05/07/2020, when she developed a bruise on her leg. She then had blood on her toothbrush. Finally she developed petechiae and "that's when I started paying attention," as she has had them before. She called her primary MD Dr Jonni Sanger who checked a CBC  showing a platelet count of <10K with otherwise normal counts. The patient called our office and was directed to the ED.  I met with Bailey Sheppard in the emergency room 05/10/2020.  At that time her platelet count was 7000.  We started her on steroids and IVIG and by the next day her platelets were just over 20,000.  She had a very rapid recovery, received 2 doses of IVIG total, and was discharged on 8 mg of dexamethasone twice daily.  She started on weekly rituximab and her steroid taper on 05/13/2020.  Going back 2 weeks she is not aware of any intercurrent infections or change in medications.  Her 8 year old son is a Associate Professor and they have traveled to [place] in [place] in the last 2 weekends.  They will had some sinus issues but no fever or symptoms of flu.  She has not received a coronavirus vaccine.  The patient's subsequent history is as detailed below.   PAST MEDICAL HISTORY: Past Medical History:  Diagnosis Date  . Acute pericarditis, unspecified 06/11/2013  . Anemia   . Anxiety   . Chronic hypertension during pregnancy 09/17/2018  . Depression   . Elevated hemoglobin A1c 2018   5.7  . Fibroids   . Hypertension   . ITP (idiopathic thrombocytopenic purpura)   . Obesity   . Vaginal Pap smear, abnormal     PAST SURGICAL HISTORY: Past Surgical History:  Procedure Laterality Date  . INTRAUTERINE DEVICE (IUD) INSERTION  04/2012  . UMBILICAL HERNIA REPAIR      FAMILY HISTORY Family History  Problem Relation Age of Onset  . Hypertension Mother   . Diabetes Father   . Kidney failure Father   . Heart failure Father   . Diabetes Brother   . Lupus Cousin   . Diabetes Maternal Grandfather   . Kidney failure Maternal Grandfather   . Diabetes Paternal Grandmother     GYNECOLOGIC HISTORY:  G3 P3, Menarche age 89. Age of first live birth: 51.    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  Her husband was a  Software engineer at a private school but passed away suddenly, at home, from a presumed myocardial infarction, February 2021.  (His family has a strong history of heart attacks and strokes).. Their 3 sons  are currently 12, 75, and less than 67 years old    ADVANCED DIRECTIVES: To be discussed   HEALTH MAINTENANCE: Social History   Tobacco Use  . Smoking status: Never Smoker  . Smokeless tobacco: Never Used  Vaping Use  . Vaping Use: Never used  Substance Use Topics  . Alcohol use: No  . Drug use: No      Allergies  Allergen Reactions  . Nsaids     ITP    Current Outpatient Medications  Medication Sig Dispense Refill  . acetaminophen (TYLENOL) 500 MG tablet Take 500 mg by mouth every 6 (six) hours as needed for headache.    . diltiazem (CARDIZEM CD) 240 MG 24 hr capsule Take 1 capsule (240 mg total) by mouth daily. 90 capsule 3  . hydrochlorothiazide (HYDRODIURIL) 25 MG tablet Take 1 tablet (25 mg total) by mouth daily. 90 tablet 3  . levonorgestrel (MIRENA) 20 MCG/24HR IUD 1 each by Intrauterine route once.    Marland Kitchen MELATONIN GUMMIES PO Take 1 each by mouth at bedtime as needed (sleep).    . Multiple Vitamin (MULTIVITAMIN WITH MINERALS) TABS tablet Take 1 tablet by mouth daily.    . traMADol (ULTRAM) 50 MG tablet Take 50 mg by mouth every 6 (six) hours as needed for moderate pain.     No current facility-administered medications for this visit.   Facility-Administered Medications Ordered in Other Visits  Medication Dose Route Frequency Provider Last Rate Last Admin  . 0.9 %  sodium chloride infusion   Intravenous Once Roxanne Panek, Virgie Dad, MD      . acetaminophen (TYLENOL) tablet 650 mg  650 mg Oral Once Khristen Cheyney, Virgie Dad, MD      . diphenhydrAMINE (BENADRYL) capsule 25 mg  25 mg Oral Once Minnette Merida, Virgie Dad, MD      . riTUXimab-pvvr (RUXIENCE) 1,000 mg in sodium chloride 0.9 % 150 mL infusion  375 mg/m2 (Treatment Plan Recorded) Intravenous Once Dreux Mcgroarty, Virgie Dad,  MD        OBJECTIVE: African-American woman in no acute distress  Vitals:   06/17/20 0810  BP: 123/83  Pulse: 82  Resp: 18  Temp: 98.1 F (36.7 C)  SpO2: 99%     Body mass index is 42.49 kg/m.   Wt Readings from Last 3 Encounters:  06/17/20 296 lb 1.6 oz (134.3 kg)  06/10/20 297 lb (134.7 kg)  06/07/20 298 lb 8 oz (135.4 kg)     ECOG FS:1 - Symptomatic but completely ambulatory  Sclerae unicteric, EOMs intact Wearing a mask No cervical or supraclavicular adenopathy Lungs no rales or rhonchi Heart regular rate  and rhythm Abd soft, nontender, positive bowel sounds MSK no focal spinal tenderness, no upper extremity lymphedema Neuro: nonfocal, well oriented, appropriate affect Breasts: Deferred  LAB RESULTS:  CMP     Component Value Date/Time   NA 137 06/17/2020 0757   NA 140 09/13/2014 1400   K 3.4 (L) 06/17/2020 0757   K 3.7 09/13/2014 1400   CL 103 06/17/2020 0757   CL 105 06/19/2013 0845   CO2 23 06/17/2020 0757   CO2 24 09/13/2014 1400   GLUCOSE 104 (H) 06/17/2020 0757   GLUCOSE 110 09/13/2014 1400   GLUCOSE 97 06/19/2013 0845   BUN 9 06/17/2020 0757   BUN 9.7 09/13/2014 1400   CREATININE 0.82 06/17/2020 0757   CREATININE 0.88 05/13/2020 0755   CREATININE 0.76 03/06/2019 1440   CREATININE 0.8 09/13/2014 1400   CALCIUM 9.6 06/17/2020 0757   CALCIUM 9.1 09/13/2014 1400   PROT 7.4 06/17/2020 0757   PROT 7.1 09/13/2014 1400   ALBUMIN 3.3 (L) 06/17/2020 0757   ALBUMIN 4.0 09/13/2014 1400   AST 20 06/17/2020 0757   AST 42 (H) 05/13/2020 0755   AST 14 09/13/2014 1400   ALT 23 06/17/2020 0757   ALT 73 (H) 05/13/2020 0755   ALT 13 09/13/2014 1400   ALKPHOS 60 06/17/2020 0757   ALKPHOS 53 09/13/2014 1400   BILITOT 0.5 06/17/2020 0757   BILITOT 1.0 05/13/2020 0755   BILITOT 0.95 09/13/2014 1400   GFRNONAA >60 06/17/2020 0757   GFRNONAA >60 05/13/2020 0755   GFRAA >60 06/17/2020 0757   GFRAA >60 05/13/2020 0755    No results found for: TOTALPROTELP,  ALBUMINELP, A1GS, A2GS, BETS, BETA2SER, GAMS, MSPIKE, SPEI  No results found for: KPAFRELGTCHN, LAMBDASER, KAPLAMBRATIO  Lab Results  Component Value Date   WBC 6.0 06/17/2020   NEUTROABS 4.2 06/17/2020   HGB 12.8 06/17/2020   HCT 38.0 06/17/2020   MCV 82.4 06/17/2020   PLT 350 06/17/2020   No results found for: LABCA2  No components found for: [id number]  No results for input(s): INR in the last 168 hours.  No results found for: LABCA2  No results found for: OYD741  No results found for: OIN867  No results found for: EHM094  No results found for: CA2729  No components found for: HGQUANT  No results found for: CEA1 / No results found for: CEA1   No results found for: AFPTUMOR  No results found for: [facility]  Appointment on 06/17/2020  Component Date Value Ref Range Status  . Sodium 06/17/2020 137  135 - 145 mmol/L Final  . Potassium 06/17/2020 3.4* 3.5 - 5.1 mmol/L Final  . Chloride 06/17/2020 103  98 - 111 mmol/L Final  . CO2 06/17/2020 23  22 - 32 mmol/L Final  . Glucose, Bld 06/17/2020 104* 70 - 99 mg/dL Final   Glucose reference range applies only to samples taken after fasting for at least 8 hours.  . BUN 06/17/2020 9  6 - 20 mg/dL Final  . Creatinine, Ser 06/17/2020 0.82  0.44 - 1.00 mg/dL Final  . Calcium 06/17/2020 9.6  8.9 - 10.3 mg/dL Final  . Total Protein 06/17/2020 7.4  6.5 - 8.1 g/dL Final  . Albumin 06/17/2020 3.3* 3.5 - 5.0 g/dL Final  . AST 06/17/2020 20  15 - 41 U/L Final  . ALT 06/17/2020 23  0 - 44 U/L Final  . Alkaline Phosphatase 06/17/2020 60  38 - 126 U/L Final  . Total Bilirubin 06/17/2020 0.5  0.3 - 1.2 mg/dL  Final  . GFR calc non Af Amer 06/17/2020 >60  >60 mL/min Final  . GFR calc Af Amer 06/17/2020 >60  >60 mL/min Final  . Anion gap 06/17/2020 11  5 - 15 Final   Performed at [facility], [place] [address] [place], [place] [address]  . WBC 06/17/2020 6.0  4.0 - 10.5 K/uL Final  . RBC 06/17/2020 4.61  3.87  - 5.11 MIL/uL Final  . Hemoglobin 06/17/2020 12.8  12.0 - 15.0 g/dL Final  . HCT 06/17/2020 38.0  36 - 46 % Final  . MCV 06/17/2020 82.4  80.0 - 100.0 fL Final  . MCH 06/17/2020 27.8  26.0 - 34.0 pg Final  . MCHC 06/17/2020 33.7  30.0 - 36.0 g/dL Final  . RDW 06/17/2020 15.5  11.5 - 15.5 % Final  . Platelets 06/17/2020 350  150 - 400 K/uL Final  . nRBC 06/17/2020 0.0  0.0 - 0.2 % Final  . Neutrophils Relative % 06/17/2020 70  % Final  . Neutro Abs 06/17/2020 4.2  1.7 - 7.7 K/uL Final  . Lymphocytes Relative 06/17/2020 17  % Final  . Lymphs Abs 06/17/2020 1.0  0.7 - 4.0 K/uL Final  . Monocytes Relative 06/17/2020 10  % Final  . Monocytes Absolute 06/17/2020 0.6  0 - 1 K/uL Final  . Eosinophils Relative 06/17/2020 2  % Final  . Eosinophils Absolute 06/17/2020 0.1  0 - 0 K/uL Final  . Basophils Relative 06/17/2020 0  % Final  . Basophils Absolute 06/17/2020 0.0  0 - 0 K/uL Final  . Immature Granulocytes 06/17/2020 1  % Final  . Abs Immature Granulocytes 06/17/2020 0.04  0.00 - 0.07 K/uL Final   Performed at [facility], [place] [address] [place], [place] [address]    (this displays the last labs from the last 3 days)  No results found for: HGBA, HGBA2QUANT, HGBFQUANT, HGBSQUAN (Hemoglobinopathy evaluation)   Lab Results  Component Value Date   LDH 159 07/14/2018    Lab Results  Component Value Date   IRON 49 04/25/2012   TIBC 391 04/25/2012   IRONPCTSAT 13 (L) 04/25/2012   (Iron and TIBC)  Lab Results  Component Value Date   FERRITIN 29 12/02/2018    Urinalysis    Component Value Date/Time   COLORURINE YELLOW 01/21/2014 2018   APPEARANCEUR CLEAR 01/21/2014 2018   LABSPEC 1.022 01/21/2014 2018   LABSPEC 1.005 06/19/2013 1127   PHURINE 6.0 01/21/2014 2018   GLUCOSEU NEGATIVE 01/21/2014 2018   GLUCOSEU Negative 06/19/2013 1127   HGBUR NEGATIVE 01/21/2014 2018   [place] NEGATIVE 01/21/2014 2018   BILIRUBINUR Negative 06/19/2013 [place] 01/21/2014 2018   PROTEINUR NEGATIVE 01/21/2014 2018   UROBILINOGEN 0.2 01/21/2014 2018   UROBILINOGEN 0.2 06/19/2013 1127   NITRITE NEGATIVE 01/21/2014 2018   LEUKOCYTESUR NEGATIVE 01/21/2014 2018   LEUKOCYTESUR [place] 06/19/2013 1127    STUDIES: DG Knee 3 Views Left  Result Date: 06/07/2020 CLINICAL DATA:  Bilateral knee pain EXAM: LEFT KNEE - 3 VIEW; RIGHT KNEE - 3 VIEW COMPARISON:  None. FINDINGS: No evidence of fracture, dislocation, or joint effusion. No evidence of arthropathy or other focal bone abnormality. Soft tissues are unremarkable. IMPRESSION: No fracture or dislocation of the bilateral knees. Electronically Signed   By: Eddie Candle M.D.   On: 06/07/2020 13:47   DG Knee 3 Views Right  Result Date: 06/07/2020 CLINICAL DATA:  Bilateral knee pain EXAM: LEFT KNEE - 3  VIEW; RIGHT KNEE - 3 VIEW COMPARISON:  None. FINDINGS: No evidence of fracture, dislocation, or joint effusion. No evidence of arthropathy or other focal bone abnormality. Soft tissues are unremarkable. IMPRESSION: No fracture or dislocation of the bilateral knees. Electronically Signed   By: Eddie Candle M.D.   On: 06/07/2020 13:47     ELIGIBLE FOR AVAILABLE RESEARCH PROTOCOL: no  ASSESSMENT: 39 y.o. Bailey Sheppard, [place] woman:  1.  ITP, requiring hospitalization in June 2014. Patient was treated with IV dexamethasone and IVIG. She was then on a dexamethasone taper between 06/22/2013 and 07/12/2013. She received 6 weekly doses of rituximab, followed by 2 q. three-week doses, with the final dose given on 08/21/2013.  2. History of iron deficiency anemia,             (a) IV Feraheme was given in March 2013.             (b) received repeat IV Feraheme on 07/17/2018 and 07/24/2018 for a ferritin less than 6  3. Acute pericarditis - resolved   4.  Pregnancy complicated by iron deficiency and thrombocytopenia: Resolved  5.  Recurrent ITP 05/09/2020: platelet count 7000  (a) dexamethasone  started 05/09/2020, on taper to off  (b) IVIG given 05/09/2020 and 05/10/2020  (c) rituximab started 05/13/2020   (i) negative hepatitis B surface antigen August 2018 and core antibody June 2014   (ii) repeat hepatitis B studies 05/20/2020 -   PLAN: Erla is having a very good response to rituximab.  She will receive her sixth weekly dose today.  We are going to move the treatments to every 2 weeks for the next 2 treatments, and then if everything continues well she will receive 1 on 08/05/2020 and after that we may initiate follow-up.  She is delighted with this plan.  I wonder if she had an infection with COVID-19 last week.  She was not tested for it.  I will check for antibodies next time she has lab work  She knows to call for any other issue that may develop before the next visit  Total encounter time 25 minutes.Chauncey Cruel, MD   06/17/2020 9:40 AM Medical Oncology and Hematology [facility] [place], [place] [address] Tel. [phone]    Fax. [phone]   I, Wilburn Mylar, am acting as scribe for Dr. Virgie Dad. Bailey Sheppard.  I, Lurline Del MD, have reviewed the above documentation for accuracy and completeness, and I agree with the above.   *Total Encounter Time as defined by the Centers for Medicare and Medicaid Services includes, in addition to the face-to-face time of a patient visit (documented in the note above) non-face-to-face time: obtaining and reviewing outside history, ordering and reviewing medications, tests or procedures, care coordination (communications with other health care professionals or caregivers) and documentation in the medical record.

## 2020-06-17 ENCOUNTER — Inpatient Hospital Stay: Payer: No Typology Code available for payment source

## 2020-06-17 ENCOUNTER — Other Ambulatory Visit: Payer: Self-pay

## 2020-06-17 ENCOUNTER — Inpatient Hospital Stay (HOSPITAL_BASED_OUTPATIENT_CLINIC_OR_DEPARTMENT_OTHER): Payer: No Typology Code available for payment source | Admitting: Oncology

## 2020-06-17 ENCOUNTER — Telehealth: Payer: Self-pay | Admitting: Oncology

## 2020-06-17 VITALS — BP 123/83 | HR 82 | Temp 98.1°F | Resp 18 | Ht 70.0 in | Wt 296.1 lb

## 2020-06-17 VITALS — BP 125/75 | HR 81 | Temp 98.0°F | Resp 18

## 2020-06-17 DIAGNOSIS — D693 Immune thrombocytopenic purpura: Secondary | ICD-10-CM

## 2020-06-17 DIAGNOSIS — Z6841 Body Mass Index (BMI) 40.0 and over, adult: Secondary | ICD-10-CM

## 2020-06-17 DIAGNOSIS — I10 Essential (primary) hypertension: Secondary | ICD-10-CM

## 2020-06-17 DIAGNOSIS — Z5112 Encounter for antineoplastic immunotherapy: Secondary | ICD-10-CM | POA: Diagnosis not present

## 2020-06-17 LAB — CBC WITH DIFFERENTIAL/PLATELET
Abs Immature Granulocytes: 0.04 10*3/uL (ref 0.00–0.07)
Basophils Absolute: 0 10*3/uL (ref 0.0–0.1)
Basophils Relative: 0 %
Eosinophils Absolute: 0.1 10*3/uL (ref 0.0–0.5)
Eosinophils Relative: 2 %
HCT: 38 % (ref 36.0–46.0)
Hemoglobin: 12.8 g/dL (ref 12.0–15.0)
Immature Granulocytes: 1 %
Lymphocytes Relative: 17 %
Lymphs Abs: 1 10*3/uL (ref 0.7–4.0)
MCH: 27.8 pg (ref 26.0–34.0)
MCHC: 33.7 g/dL (ref 30.0–36.0)
MCV: 82.4 fL (ref 80.0–100.0)
Monocytes Absolute: 0.6 10*3/uL (ref 0.1–1.0)
Monocytes Relative: 10 %
Neutro Abs: 4.2 10*3/uL (ref 1.7–7.7)
Neutrophils Relative %: 70 %
Platelets: 350 10*3/uL (ref 150–400)
RBC: 4.61 MIL/uL (ref 3.87–5.11)
RDW: 15.5 % (ref 11.5–15.5)
WBC: 6 10*3/uL (ref 4.0–10.5)
nRBC: 0 % (ref 0.0–0.2)

## 2020-06-17 LAB — COMPREHENSIVE METABOLIC PANEL
ALT: 23 U/L (ref 0–44)
AST: 20 U/L (ref 15–41)
Albumin: 3.3 g/dL — ABNORMAL LOW (ref 3.5–5.0)
Alkaline Phosphatase: 60 U/L (ref 38–126)
Anion gap: 11 (ref 5–15)
BUN: 9 mg/dL (ref 6–20)
CO2: 23 mmol/L (ref 22–32)
Calcium: 9.6 mg/dL (ref 8.9–10.3)
Chloride: 103 mmol/L (ref 98–111)
Creatinine, Ser: 0.82 mg/dL (ref 0.44–1.00)
GFR calc Af Amer: >60 mL/min (ref >60)
GFR calc non Af Amer: >60 mL/min (ref >60)
Glucose, Bld: 104 mg/dL — ABNORMAL HIGH (ref 70–99)
Potassium: 3.4 mmol/L — ABNORMAL LOW (ref 3.5–5.1)
Sodium: 137 mmol/L (ref 135–145)
Total Bilirubin: 0.5 mg/dL (ref 0.3–1.2)
Total Protein: 7.4 g/dL (ref 6.5–8.1)

## 2020-06-17 MED ORDER — SODIUM CHLORIDE 0.9 % IV SOLN
Freq: Once | INTRAVENOUS | Status: AC
  Filled 2020-06-17: qty 250

## 2020-06-17 MED ORDER — DIPHENHYDRAMINE HCL 25 MG PO CAPS
25.0000 mg | ORAL_CAPSULE | Freq: Once | ORAL | Status: AC
  Administered 2020-06-17: 25 mg via ORAL

## 2020-06-17 MED ORDER — ACETAMINOPHEN 325 MG PO TABS
ORAL_TABLET | ORAL | Status: AC
  Filled 2020-06-17: qty 2

## 2020-06-17 MED ORDER — ACETAMINOPHEN 325 MG PO TABS
650.0000 mg | ORAL_TABLET | Freq: Once | ORAL | Status: AC
  Administered 2020-06-17: 650 mg via ORAL

## 2020-06-17 MED ORDER — DIPHENHYDRAMINE HCL 25 MG PO CAPS
ORAL_CAPSULE | ORAL | Status: AC
  Filled 2020-06-17: qty 1

## 2020-06-17 MED ORDER — SODIUM CHLORIDE 0.9 % IV SOLN
375.0000 mg/m2 | Freq: Once | INTRAVENOUS | Status: AC
  Administered 2020-06-17: 1000 mg via INTRAVENOUS
  Filled 2020-06-17: qty 100

## 2020-06-17 NOTE — Telephone Encounter (Signed)
Cancelled and Scheduled appts per 6/18 los. Left voicemail with cancellation details as well and new appt dates and times.

## 2020-06-17 NOTE — Patient Instructions (Signed)
[facility] Discharge Instructions for Patients Receiving Chemotherapy  Today you received the following chemotherapy agents: rituximab.  To help prevent nausea and vomiting after your treatment, we encourage you to take your nausea medication as directed.   If you develop nausea and vomiting that is not controlled by your nausea medication, call the clinic.   BELOW ARE SYMPTOMS THAT SHOULD BE REPORTED IMMEDIATELY:  *FEVER GREATER THAN 100.5 F  *CHILLS WITH OR WITHOUT FEVER  NAUSEA AND VOMITING THAT IS NOT CONTROLLED WITH YOUR NAUSEA MEDICATION  *UNUSUAL SHORTNESS OF BREATH  *UNUSUAL BRUISING OR BLEEDING  TENDERNESS IN MOUTH AND THROAT WITH OR WITHOUT PRESENCE OF ULCERS  *URINARY PROBLEMS  *BOWEL PROBLEMS  UNUSUAL RASH Items with * indicate a potential emergency and should be followed up as soon as possible.  Feel free to call the clinic should you have any questions or concerns. The clinic phone number is [phone].  Please show the CHEMO ALERT CARD at check-in to the Emergency Department and triage nurse.   

## 2020-06-24 ENCOUNTER — Inpatient Hospital Stay: Payer: No Typology Code available for payment source

## 2020-06-27 NOTE — Progress Notes (Signed)
Pharmacist Chemotherapy Monitoring - Follow Up Assessment    I verify that I have reviewed each item in the below checklist:  . Regimen for the patient is scheduled for the appropriate day and plan matches scheduled date. Marland Kitchen Appropriate non-routine labs are ordered dependent on drug ordered. . If applicable, additional medications reviewed and ordered per protocol based on lifetime cumulative doses and/or treatment regimen.   Plan for follow-up and/or issues identified: No . I-vent associated with next due treatment: No . MD and/or nursing notified: No  Philomena Course 06/27/2020 3:08 PM;emo

## 2020-07-01 ENCOUNTER — Inpatient Hospital Stay: Payer: No Typology Code available for payment source | Attending: Oncology

## 2020-07-01 ENCOUNTER — Inpatient Hospital Stay: Payer: No Typology Code available for payment source

## 2020-07-01 ENCOUNTER — Other Ambulatory Visit: Payer: Self-pay

## 2020-07-01 VITALS — BP 123/84 | HR 76 | Temp 97.7°F | Resp 18 | Wt 294.5 lb

## 2020-07-01 DIAGNOSIS — D693 Immune thrombocytopenic purpura: Secondary | ICD-10-CM

## 2020-07-01 DIAGNOSIS — I10 Essential (primary) hypertension: Secondary | ICD-10-CM

## 2020-07-01 DIAGNOSIS — Z5112 Encounter for antineoplastic immunotherapy: Secondary | ICD-10-CM | POA: Diagnosis present

## 2020-07-01 LAB — CBC WITH DIFFERENTIAL/PLATELET
Abs Immature Granulocytes: 0.01 10*3/uL (ref 0.00–0.07)
Basophils Absolute: 0 10*3/uL (ref 0.0–0.1)
Basophils Relative: 0 %
Eosinophils Absolute: 0.4 10*3/uL (ref 0.0–0.5)
Eosinophils Relative: 7 %
HCT: 35.7 % — ABNORMAL LOW (ref 36.0–46.0)
Hemoglobin: 11.7 g/dL — ABNORMAL LOW (ref 12.0–15.0)
Immature Granulocytes: 0 %
Lymphocytes Relative: 20 %
Lymphs Abs: 1.1 10*3/uL (ref 0.7–4.0)
MCH: 27.2 pg (ref 26.0–34.0)
MCHC: 32.8 g/dL (ref 30.0–36.0)
MCV: 83 fL (ref 80.0–100.0)
Monocytes Absolute: 0.5 10*3/uL (ref 0.1–1.0)
Monocytes Relative: 10 %
Neutro Abs: 3.6 10*3/uL (ref 1.7–7.7)
Neutrophils Relative %: 63 %
Platelets: 222 10*3/uL (ref 150–400)
RBC: 4.3 MIL/uL (ref 3.87–5.11)
RDW: 15.8 % — ABNORMAL HIGH (ref 11.5–15.5)
WBC: 5.7 10*3/uL (ref 4.0–10.5)
nRBC: 0 % (ref 0.0–0.2)

## 2020-07-01 LAB — COMPREHENSIVE METABOLIC PANEL
ALT: 26 U/L (ref 0–44)
AST: 22 U/L (ref 15–41)
Albumin: 3.5 g/dL (ref 3.5–5.0)
Alkaline Phosphatase: 62 U/L (ref 38–126)
Anion gap: 12 (ref 5–15)
BUN: 10 mg/dL (ref 6–20)
CO2: 23 mmol/L (ref 22–32)
Calcium: 9 mg/dL (ref 8.9–10.3)
Chloride: 107 mmol/L (ref 98–111)
Creatinine, Ser: 1.03 mg/dL — ABNORMAL HIGH (ref 0.44–1.00)
GFR calc Af Amer: >60 mL/min (ref >60)
GFR calc non Af Amer: >60 mL/min (ref >60)
Glucose, Bld: 131 mg/dL — ABNORMAL HIGH (ref 70–99)
Potassium: 3.2 mmol/L — ABNORMAL LOW (ref 3.5–5.1)
Sodium: 142 mmol/L (ref 135–145)
Total Bilirubin: 0.8 mg/dL (ref 0.3–1.2)
Total Protein: 6.8 g/dL (ref 6.5–8.1)

## 2020-07-01 MED ORDER — SODIUM CHLORIDE 0.9 % IV SOLN
375.0000 mg/m2 | Freq: Once | INTRAVENOUS | Status: AC
  Administered 2020-07-01: 1000 mg via INTRAVENOUS
  Filled 2020-07-01: qty 100

## 2020-07-01 MED ORDER — DIPHENHYDRAMINE HCL 25 MG PO CAPS
ORAL_CAPSULE | ORAL | Status: AC
  Filled 2020-07-01: qty 1

## 2020-07-01 MED ORDER — DIPHENHYDRAMINE HCL 25 MG PO CAPS
25.0000 mg | ORAL_CAPSULE | Freq: Once | ORAL | Status: AC
  Administered 2020-07-01: 25 mg via ORAL

## 2020-07-01 MED ORDER — SODIUM CHLORIDE 0.9 % IV SOLN
Freq: Once | INTRAVENOUS | Status: AC
  Filled 2020-07-01: qty 250

## 2020-07-01 MED ORDER — ACETAMINOPHEN 325 MG PO TABS
ORAL_TABLET | ORAL | Status: AC
  Filled 2020-07-01: qty 2

## 2020-07-01 MED ORDER — ACETAMINOPHEN 325 MG PO TABS
650.0000 mg | ORAL_TABLET | Freq: Once | ORAL | Status: AC
  Administered 2020-07-01: 650 mg via ORAL

## 2020-07-01 NOTE — Patient Instructions (Signed)
[facility] Discharge Instructions for Patients Receiving Chemotherapy  Today you received the following chemotherapy agents: rituximab.  To help prevent nausea and vomiting after your treatment, we encourage you to take your nausea medication as directed.   If you develop nausea and vomiting that is not controlled by your nausea medication, call the clinic.   BELOW ARE SYMPTOMS THAT SHOULD BE REPORTED IMMEDIATELY:  *FEVER GREATER THAN 100.5 F  *CHILLS WITH OR WITHOUT FEVER  NAUSEA AND VOMITING THAT IS NOT CONTROLLED WITH YOUR NAUSEA MEDICATION  *UNUSUAL SHORTNESS OF BREATH  *UNUSUAL BRUISING OR BLEEDING  TENDERNESS IN MOUTH AND THROAT WITH OR WITHOUT PRESENCE OF ULCERS  *URINARY PROBLEMS  *BOWEL PROBLEMS  UNUSUAL RASH Items with * indicate a potential emergency and should be followed up as soon as possible.  Feel free to call the clinic should you have any questions or concerns. The clinic phone number is [phone].  Please show the CHEMO ALERT CARD at check-in to the Emergency Department and triage nurse.   

## 2020-07-02 LAB — SAR COV2 SEROLOGY (COVID19)AB(IGG),IA: SARS-CoV-2 Ab, IgG: NONREACTIVE

## 2020-07-05 ENCOUNTER — Telehealth: Payer: Self-pay

## 2020-07-05 NOTE — Telephone Encounter (Signed)
-----   Message from Gardenia Phlegm, NP sent at 07/01/2020 12:53 PM EDT ----- Potassium slightly low.  Make sure no diarrhea, and that she is taking in enough potassium rich foods ----- Message ----- From: Interface, [facility] Sent: 07/01/2020   9:20 AM EDT To: Chauncey Cruel, MD

## 2020-07-05 NOTE — Telephone Encounter (Signed)
Called pt with below information per Wilber Bihari, NP. Pt states she is having 1-2 episodes of "loose stool" daily and states she is eating "lots of raw spinach in burrito bowls". This LPN suggested to pt cooked spinach, cooked broccoli, cantaloupe, potatoes, sweet potatoes. Pt verbalized agreement and understanding. Pt understands she should call back if she develops persistent diarrhea.

## 2020-07-08 ENCOUNTER — Ambulatory Visit: Payer: No Typology Code available for payment source

## 2020-07-08 ENCOUNTER — Other Ambulatory Visit: Payer: No Typology Code available for payment source

## 2020-07-15 ENCOUNTER — Other Ambulatory Visit: Payer: Self-pay

## 2020-07-15 ENCOUNTER — Inpatient Hospital Stay: Payer: No Typology Code available for payment source

## 2020-07-15 VITALS — BP 144/96 | HR 73 | Temp 98.2°F | Resp 16 | Wt 293.0 lb

## 2020-07-15 DIAGNOSIS — I10 Essential (primary) hypertension: Secondary | ICD-10-CM

## 2020-07-15 DIAGNOSIS — D693 Immune thrombocytopenic purpura: Secondary | ICD-10-CM

## 2020-07-15 DIAGNOSIS — Z5112 Encounter for antineoplastic immunotherapy: Secondary | ICD-10-CM | POA: Diagnosis not present

## 2020-07-15 LAB — CBC WITH DIFFERENTIAL/PLATELET
Abs Immature Granulocytes: 0.01 10*3/uL (ref 0.00–0.07)
Basophils Absolute: 0 10*3/uL (ref 0.0–0.1)
Basophils Relative: 0 %
Eosinophils Absolute: 0.2 10*3/uL (ref 0.0–0.5)
Eosinophils Relative: 4 %
HCT: 38.6 % (ref 36.0–46.0)
Hemoglobin: 12.8 g/dL (ref 12.0–15.0)
Immature Granulocytes: 0 %
Lymphocytes Relative: 18 %
Lymphs Abs: 1 10*3/uL (ref 0.7–4.0)
MCH: 27.8 pg (ref 26.0–34.0)
MCHC: 33.2 g/dL (ref 30.0–36.0)
MCV: 83.9 fL (ref 80.0–100.0)
Monocytes Absolute: 0.5 10*3/uL (ref 0.1–1.0)
Monocytes Relative: 9 %
Neutro Abs: 3.8 10*3/uL (ref 1.7–7.7)
Neutrophils Relative %: 69 %
Platelets: 234 10*3/uL (ref 150–400)
RBC: 4.6 MIL/uL (ref 3.87–5.11)
RDW: 16.2 % — ABNORMAL HIGH (ref 11.5–15.5)
WBC: 5.6 10*3/uL (ref 4.0–10.5)
nRBC: 0 % (ref 0.0–0.2)

## 2020-07-15 MED ORDER — DIPHENHYDRAMINE HCL 25 MG PO CAPS
25.0000 mg | ORAL_CAPSULE | Freq: Once | ORAL | Status: AC
  Administered 2020-07-15: 25 mg via ORAL

## 2020-07-15 MED ORDER — SODIUM CHLORIDE 0.9 % IV SOLN
Freq: Once | INTRAVENOUS | Status: AC
  Filled 2020-07-15: qty 250

## 2020-07-15 MED ORDER — SODIUM CHLORIDE 0.9 % IV SOLN
375.0000 mg/m2 | Freq: Once | INTRAVENOUS | Status: AC
  Administered 2020-07-15: 1000 mg via INTRAVENOUS
  Filled 2020-07-15: qty 100

## 2020-07-15 MED ORDER — ACETAMINOPHEN 325 MG PO TABS
ORAL_TABLET | ORAL | Status: AC
  Filled 2020-07-15: qty 2

## 2020-07-15 MED ORDER — ACETAMINOPHEN 325 MG PO TABS
650.0000 mg | ORAL_TABLET | Freq: Once | ORAL | Status: AC
  Administered 2020-07-15: 650 mg via ORAL

## 2020-07-15 MED ORDER — DIPHENHYDRAMINE HCL 25 MG PO CAPS
ORAL_CAPSULE | ORAL | Status: AC
  Filled 2020-07-15: qty 1

## 2020-07-15 NOTE — Patient Instructions (Signed)
[facility] Discharge Instructions for Patients Receiving Chemotherapy  Today you received the following chemotherapy agents: rituximab.  To help prevent nausea and vomiting after your treatment, we encourage you to take your nausea medication as directed.   If you develop nausea and vomiting that is not controlled by your nausea medication, call the clinic.   BELOW ARE SYMPTOMS THAT SHOULD BE REPORTED IMMEDIATELY:  *FEVER GREATER THAN 100.5 F  *CHILLS WITH OR WITHOUT FEVER  NAUSEA AND VOMITING THAT IS NOT CONTROLLED WITH YOUR NAUSEA MEDICATION  *UNUSUAL SHORTNESS OF BREATH  *UNUSUAL BRUISING OR BLEEDING  TENDERNESS IN MOUTH AND THROAT WITH OR WITHOUT PRESENCE OF ULCERS  *URINARY PROBLEMS  *BOWEL PROBLEMS  UNUSUAL RASH Items with * indicate a potential emergency and should be followed up as soon as possible.  Feel free to call the clinic should you have any questions or concerns. The clinic phone number is [phone].  Please show the CHEMO ALERT CARD at check-in to the Emergency Department and triage nurse.   

## 2020-08-03 NOTE — Progress Notes (Signed)
[facility]  [phone] [phone]     ID: Bailey Sheppard DOB: Sep 16, 1981  MR#: [id number]  MLY#:[id number]  Patient Care Team: Leamon Arnt, MD as PCP - General (Family Medicine) Denaya Horn, Virgie Dad, MD as Consulting Physician (Hematology and Oncology) Yisroel Ramming, Everardo All, MD as Consulting Physician (Obstetrics and Gynecology) Chauncey Cruel, MD OTHER MD:  CHIEF COMPLAINT: Immune thrombocytopenia  CURRENT TREATMENT: Completing rituximab   INTERVAL HISTORY: Bailey Sheppard returns today for follow up of her immune thrombocytopenia.   She continues on every two-week rituximab since June.  Today is week 9.  She has had no acute side effects from this that she is aware of.  She has had an excellent response with normalization of her platelet count. Lab Results  Component Value Date   PLT 219 08/04/2020   PLT 234 07/15/2020   PLT 222 07/01/2020   PLT 350 06/17/2020   PLT 332 06/10/2020   She did not receive the treatment last week.  Treatment today would be 3 weeks out.  REVIEW OF SYSTEMS: Bailey Sheppard has had no side effects from the rituximab that she is aware of.  She is working full-time and feels pretty normal.  She wonders if she should take a little bit of iron and I have no problem with that of course.  We are waiting for her to finish her right toxin to receive her vaccine but she is very eager to get that done.  She is not exercising regularly.  A detailed review of systems today was otherwise stable.   HISTORY OF CURRENT ILLNESS: From the May 2021 intake note:  I last saw Bailey Sheppard in 2019.  At that time she continued in remission from her ITP. She has been stable off treatment until this weekend, 05/07/2020, when she developed a bruise on her leg. She then had blood on her toothbrush. Finally she developed petechiae and "that's when I started paying attention," as she has had them before. She called her primary MD Dr Jonni Sanger who  checked a CBC showing a platelet count of <10K with otherwise normal counts. The patient called our office and was directed to the ED.  I met with Bailey Sheppard in the emergency room 05/10/2020.  At that time her platelet count was 7000.  We started her on steroids and IVIG and by the next day her platelets were just over 20,000.  She had a very rapid recovery, received 2 doses of IVIG total, and was discharged on 8 mg of dexamethasone twice daily.  She started on weekly rituximab and her steroid taper on 05/13/2020.  Going back 2 weeks she is not aware of any intercurrent infections or change in medications.  Her 54 year old son is a Associate Professor and they have traveled to [place] in [place] in the last 2 weekends.  They will had some sinus issues but no fever or symptoms of flu.  She has not received a coronavirus vaccine.  The patient's subsequent history is as detailed below.   PAST MEDICAL HISTORY: Past Medical History:  Diagnosis Date  . Acute pericarditis, unspecified 06/11/2013  . Anemia   . Anxiety   . Chronic hypertension during pregnancy 09/17/2018  . Depression   . Elevated hemoglobin A1c 2018   5.7  . Fibroids   . Hypertension   . ITP (idiopathic thrombocytopenic purpura)   . Obesity   . Vaginal Pap smear, abnormal     PAST SURGICAL HISTORY: Past Surgical History:  Procedure Laterality  Date  . INTRAUTERINE DEVICE (IUD) INSERTION  04/2012  . UMBILICAL HERNIA REPAIR      FAMILY HISTORY Family History  Problem Relation Age of Onset  . Hypertension Mother   . Diabetes Father   . Kidney failure Father   . Heart failure Father   . Diabetes Brother   . Lupus Cousin   . Diabetes Maternal Grandfather   . Kidney failure Maternal Grandfather   . Diabetes Paternal Grandmother     GYNECOLOGIC HISTORY:  G3 P3, Menarche age 15. Age of first live birth: 52.    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  Her  husband was a Software engineer at a private school but passed away suddenly, at home, from a presumed myocardial infarction, February 2021.  (His family has a strong history of heart attacks and strokes).. Their 3 sons  are currently 89, 102, and less than 25 years old    ADVANCED DIRECTIVES: To be discussed   HEALTH MAINTENANCE: Social History   Tobacco Use  . Smoking status: Never Smoker  . Smokeless tobacco: Never Used  Vaping Use  . Vaping Use: Never used  Substance Use Topics  . Alcohol use: No  . Drug use: No      Allergies  Allergen Reactions  . Nsaids     ITP    Current Outpatient Medications  Medication Sig Dispense Refill  . acetaminophen (TYLENOL) 500 MG tablet Take 500 mg by mouth every 6 (six) hours as needed for headache.    . diltiazem (CARDIZEM CD) 240 MG 24 hr capsule Take 1 capsule (240 mg total) by mouth daily. 90 capsule 3  . hydrochlorothiazide (HYDRODIURIL) 25 MG tablet Take 1 tablet (25 mg total) by mouth daily. 90 tablet 3  . levonorgestrel (MIRENA) 20 MCG/24HR IUD 1 each by Intrauterine route once.    Marland Kitchen MELATONIN GUMMIES PO Take 1 each by mouth at bedtime as needed (sleep).    . Multiple Vitamin (MULTIVITAMIN WITH MINERALS) TABS tablet Take 1 tablet by mouth daily.    . traMADol (ULTRAM) 50 MG tablet Take 50 mg by mouth every 6 (six) hours as needed for moderate pain.     No current facility-administered medications for this visit.    OBJECTIVE: African-American woman who appears well  Vitals:   08/04/20 1301  BP: (!) 145/93  Pulse: 70  Resp: 20  Temp: 98.5 F (36.9 C)  SpO2: 100%     Body mass index is 42.66 kg/m.   Wt Readings from Last 3 Encounters:  08/04/20 297 lb 4.8 oz (134.9 kg)  07/15/20 293 lb (132.9 kg)  07/01/20 294 lb 8 oz (133.6 kg)     ECOG FS:1 - Symptomatic but completely ambulatory  Sclerae unicteric, EOMs intact Wearing a mask No cervical or supraclavicular adenopathy Lungs no rales or rhonchi Heart  regular rate and rhythm Abd soft, nontender, positive bowel sounds MSK no focal spinal tenderness, no upper extremity lymphedema Neuro: nonfocal, well oriented, appropriate affect Breasts: Deferred   LAB RESULTS:  CMP     Component Value Date/Time   NA 142 07/01/2020 0907   NA 140 09/13/2014 1400   K 3.2 (L) 07/01/2020 0907   K 3.7 09/13/2014 1400   CL 107 07/01/2020 0907   CL 105 06/19/2013 0845   CO2 23 07/01/2020 0907   CO2 24 09/13/2014 1400   GLUCOSE 131 (H) 07/01/2020 0907   GLUCOSE 110 09/13/2014 1400  GLUCOSE 97 06/19/2013 0845   BUN 10 07/01/2020 0907   BUN 9.7 09/13/2014 1400   CREATININE 1.03 (H) 07/01/2020 0907   CREATININE 0.88 05/13/2020 0755   CREATININE 0.76 03/06/2019 1440   CREATININE 0.8 09/13/2014 1400   CALCIUM 9.0 07/01/2020 0907   CALCIUM 9.1 09/13/2014 1400   PROT 6.8 07/01/2020 0907   PROT 7.1 09/13/2014 1400   ALBUMIN 3.5 07/01/2020 0907   ALBUMIN 4.0 09/13/2014 1400   AST 22 07/01/2020 0907   AST 42 (H) 05/13/2020 0755   AST 14 09/13/2014 1400   ALT 26 07/01/2020 0907   ALT 73 (H) 05/13/2020 0755   ALT 13 09/13/2014 1400   ALKPHOS 62 07/01/2020 0907   ALKPHOS 53 09/13/2014 1400   BILITOT 0.8 07/01/2020 0907   BILITOT 1.0 05/13/2020 0755   BILITOT 0.95 09/13/2014 1400   GFRNONAA >60 07/01/2020 0907   GFRNONAA >60 05/13/2020 0755   GFRAA >60 07/01/2020 0907   GFRAA >60 05/13/2020 0755    No results found for: TOTALPROTELP, ALBUMINELP, A1GS, A2GS, BETS, BETA2SER, GAMS, MSPIKE, SPEI  No results found for: KPAFRELGTCHN, LAMBDASER, [facility]  Lab Results  Component Value Date   WBC 6.1 08/04/2020   NEUTROABS 3.6 08/04/2020   HGB 12.6 08/04/2020   HCT 37.8 08/04/2020   MCV 81.8 08/04/2020   PLT 219 08/04/2020   No results found for: LABCA2  No components found for: HBZJIR678  No results for input(s): INR in the last 168 hours.  No results found for: LABCA2  No results found for: LFY101  No results found for:  BPZ025  No results found for: ENI778  No results found for: CA2729  No components found for: HGQUANT  No results found for: CEA1 / No results found for: CEA1   No results found for: AFPTUMOR  No results found for: [facility]  Appointment on 08/04/2020  Component Date Value Ref Range Status  . WBC 08/04/2020 6.1  4.0 - 10.5 K/uL Final  . RBC 08/04/2020 4.62  3.87 - 5.11 MIL/uL Final  . Hemoglobin 08/04/2020 12.6  12.0 - 15.0 g/dL Final  . HCT 08/04/2020 37.8  36 - 46 % Final  . MCV 08/04/2020 81.8  80.0 - 100.0 fL Final  . MCH 08/04/2020 27.3  26.0 - 34.0 pg Final  . MCHC 08/04/2020 33.3  30.0 - 36.0 g/dL Final  . RDW 08/04/2020 15.6* 11.5 - 15.5 % Final  . Platelets 08/04/2020 219  150 - 400 K/uL Final  . nRBC 08/04/2020 0.0  0.0 - 0.2 % Final  . Neutrophils Relative % 08/04/2020 60  % Final  . Neutro Abs 08/04/2020 3.6  1.7 - 7.7 K/uL Final  . Lymphocytes Relative 08/04/2020 20  % Final  . Lymphs Abs 08/04/2020 1.2  0.7 - 4.0 K/uL Final  . Monocytes Relative 08/04/2020 12  % Final  . Monocytes Absolute 08/04/2020 0.7  0 - 1 K/uL Final  . Eosinophils Relative 08/04/2020 7  % Final  . Eosinophils Absolute 08/04/2020 0.4  0 - 0 K/uL Final  . Basophils Relative 08/04/2020 1  % Final  . Basophils Absolute 08/04/2020 0.0  0 - 0 K/uL Final  . Immature Granulocytes 08/04/2020 0  % Final  . Abs Immature Granulocytes 08/04/2020 0.01  0.00 - 0.07 K/uL Final   Performed at [facility], [place] [address], [place], [place] [address]    (this displays the last labs from the last 3 days)  No results found for: HGBA, HGBA2QUANT,  HGBFQUANT, HGBSQUAN (Hemoglobinopathy evaluation)   Lab Results  Component Value Date   LDH 159 07/14/2018    Lab Results  Component Value Date   IRON 49 04/25/2012   TIBC 391 04/25/2012   IRONPCTSAT 13 (L) 04/25/2012   (Iron and TIBC)  Lab Results  Component Value Date   FERRITIN 29 12/02/2018    Urinalysis    Component  Value Date/Time   COLORURINE YELLOW 01/21/2014 2018   APPEARANCEUR CLEAR 01/21/2014 2018   LABSPEC 1.022 01/21/2014 2018   LABSPEC 1.005 06/19/2013 1127   PHURINE 6.0 01/21/2014 2018   GLUCOSEU NEGATIVE 01/21/2014 2018   GLUCOSEU Negative 06/19/2013 1127   HGBUR NEGATIVE 01/21/2014 2018   [place] NEGATIVE 01/21/2014 2018   BILIRUBINUR Negative 06/19/2013 1127   KETONESUR NEGATIVE 01/21/2014 2018   PROTEINUR NEGATIVE 01/21/2014 2018   UROBILINOGEN 0.2 01/21/2014 2018   UROBILINOGEN 0.2 06/19/2013 1127   NITRITE NEGATIVE 01/21/2014 2018   LEUKOCYTESUR NEGATIVE 01/21/2014 2018   LEUKOCYTESUR Small 06/19/2013 1127    STUDIES: No results found.   ELIGIBLE FOR AVAILABLE RESEARCH PROTOCOL: no  ASSESSMENT: 39 y.o. [place], [place] woman:  1.  ITP, requiring hospitalization in June 2014. Patient was treated with IV dexamethasone and IVIG. She was then on a dexamethasone taper between 06/22/2013 and 07/12/2013. She received 6 weekly doses of rituximab, followed by 2 q. three-week doses, with the final dose given on 08/21/2013.  2. History of iron deficiency anemia,             (a) IV Feraheme was given in March 2013.             (b) received repeat IV Feraheme on 07/17/2018 and 07/24/2018 for a ferritin less than 6  3. Acute pericarditis - resolved   4.  Pregnancy complicated by iron deficiency and thrombocytopenia: Resolved  5.  Recurrent ITP 05/09/2020: platelet count 7000  (a) dexamethasone started 05/09/2020, on taper to off  (b) IVIG given 05/09/2020 and 05/10/2020  (c) rituximab started 05/13/2020, completed 9 cycles at increasing intervals 08/04/2020   (i) negative hepatitis B surface antigen August 2018 and core antibody June 2014   (ii) repeat hepatitis B studies 05/20/2020 -   PLAN: Bailey Sheppard will have her ninth dose of rituximab today.  This is now 3 weeks from the last dose and she retains an excellent platelet count.  I think with a little bit of luck  we can stop rituximab at this point and simply start following.  We are going to check counts in 4 weeks and then 4 weeks after that.  Then we will check in 8 weeks and 8 weeks after that, and then we will check a 12 weeks after that and at that point I will see her again.  If everything continues well we will continue to lengthen the follow-up interval  She knows to call for any bleeding bruising or petechiae.  I suggested she waited until early October to receive the Pfizer vaccine since I do not think she would make antibodies before then.  Even after that she may not make a very good response so when they offer her a booster I would take  Total encounter time 25 minutes.Chauncey Cruel, MD   08/04/2020 1:14 PM Medical Oncology and Hematology [facility] [place], [place] [address] Tel. [phone]    Fax. [phone]   I, Wilburn Mylar, am acting as scribe for Dr. Virgie Dad. Katlyn Muldrew.  Joylene Grapes  Aniyah Nobis MD, have reviewed the above documentation for accuracy and completeness, and I agree with the above.   *Total Encounter Time as defined by the Centers for Medicare and Medicaid Services includes, in addition to the face-to-face time of a patient visit (documented in the note above) non-face-to-face time: obtaining and reviewing outside history, ordering and reviewing medications, tests or procedures, care coordination (communications with other health care professionals or caregivers) and documentation in the medical record.

## 2020-08-04 ENCOUNTER — Other Ambulatory Visit: Payer: No Typology Code available for payment source

## 2020-08-04 ENCOUNTER — Inpatient Hospital Stay (HOSPITAL_BASED_OUTPATIENT_CLINIC_OR_DEPARTMENT_OTHER): Payer: PRIVATE HEALTH INSURANCE | Admitting: Oncology

## 2020-08-04 ENCOUNTER — Inpatient Hospital Stay: Payer: PRIVATE HEALTH INSURANCE | Attending: Oncology

## 2020-08-04 ENCOUNTER — Ambulatory Visit: Payer: No Typology Code available for payment source | Admitting: Oncology

## 2020-08-04 ENCOUNTER — Ambulatory Visit: Payer: No Typology Code available for payment source

## 2020-08-04 ENCOUNTER — Inpatient Hospital Stay: Payer: PRIVATE HEALTH INSURANCE

## 2020-08-04 ENCOUNTER — Other Ambulatory Visit: Payer: Self-pay

## 2020-08-04 VITALS — BP 116/66 | HR 70 | Temp 97.6°F | Resp 18

## 2020-08-04 VITALS — BP 145/93 | HR 70 | Temp 98.5°F | Resp 20 | Ht 70.0 in | Wt 297.3 lb

## 2020-08-04 DIAGNOSIS — E669 Obesity, unspecified: Secondary | ICD-10-CM | POA: Insufficient documentation

## 2020-08-04 DIAGNOSIS — Z79899 Other long term (current) drug therapy: Secondary | ICD-10-CM | POA: Diagnosis not present

## 2020-08-04 DIAGNOSIS — F419 Anxiety disorder, unspecified: Secondary | ICD-10-CM | POA: Insufficient documentation

## 2020-08-04 DIAGNOSIS — I10 Essential (primary) hypertension: Secondary | ICD-10-CM | POA: Diagnosis not present

## 2020-08-04 DIAGNOSIS — Z975 Presence of (intrauterine) contraceptive device: Secondary | ICD-10-CM

## 2020-08-04 DIAGNOSIS — D693 Immune thrombocytopenic purpura: Secondary | ICD-10-CM | POA: Diagnosis not present

## 2020-08-04 DIAGNOSIS — I309 Acute pericarditis, unspecified: Secondary | ICD-10-CM | POA: Diagnosis not present

## 2020-08-04 DIAGNOSIS — Z5112 Encounter for antineoplastic immunotherapy: Secondary | ICD-10-CM | POA: Insufficient documentation

## 2020-08-04 DIAGNOSIS — D649 Anemia, unspecified: Secondary | ICD-10-CM | POA: Insufficient documentation

## 2020-08-04 DIAGNOSIS — Z6841 Body Mass Index (BMI) 40.0 and over, adult: Secondary | ICD-10-CM | POA: Diagnosis not present

## 2020-08-04 LAB — COMPREHENSIVE METABOLIC PANEL
ALT: 11 U/L (ref 0–44)
AST: 12 U/L — ABNORMAL LOW (ref 15–41)
Albumin: 4.1 g/dL (ref 3.5–5.0)
Alkaline Phosphatase: 67 U/L (ref 38–126)
Anion gap: 8 (ref 5–15)
BUN: 12 mg/dL (ref 6–20)
CO2: 25 mmol/L (ref 22–32)
Calcium: 9.8 mg/dL (ref 8.9–10.3)
Chloride: 106 mmol/L (ref 98–111)
Creatinine, Ser: 0.82 mg/dL (ref 0.44–1.00)
GFR calc Af Amer: >60 mL/min (ref >60)
GFR calc non Af Amer: >60 mL/min (ref >60)
Glucose, Bld: 90 mg/dL (ref 70–99)
Potassium: 3.7 mmol/L (ref 3.5–5.1)
Sodium: 139 mmol/L (ref 135–145)
Total Bilirubin: 0.8 mg/dL (ref 0.3–1.2)
Total Protein: 6.8 g/dL (ref 6.5–8.1)

## 2020-08-04 LAB — CBC WITH DIFFERENTIAL/PLATELET
Abs Immature Granulocytes: 0.01 10*3/uL (ref 0.00–0.07)
Basophils Absolute: 0 10*3/uL (ref 0.0–0.1)
Basophils Relative: 1 %
Eosinophils Absolute: 0.4 10*3/uL (ref 0.0–0.5)
Eosinophils Relative: 7 %
HCT: 37.8 % (ref 36.0–46.0)
Hemoglobin: 12.6 g/dL (ref 12.0–15.0)
Immature Granulocytes: 0 %
Lymphocytes Relative: 20 %
Lymphs Abs: 1.2 10*3/uL (ref 0.7–4.0)
MCH: 27.3 pg (ref 26.0–34.0)
MCHC: 33.3 g/dL (ref 30.0–36.0)
MCV: 81.8 fL (ref 80.0–100.0)
Monocytes Absolute: 0.7 10*3/uL (ref 0.1–1.0)
Monocytes Relative: 12 %
Neutro Abs: 3.6 10*3/uL (ref 1.7–7.7)
Neutrophils Relative %: 60 %
Platelets: 219 10*3/uL (ref 150–400)
RBC: 4.62 MIL/uL (ref 3.87–5.11)
RDW: 15.6 % — ABNORMAL HIGH (ref 11.5–15.5)
WBC: 6.1 10*3/uL (ref 4.0–10.5)
nRBC: 0 % (ref 0.0–0.2)

## 2020-08-04 MED ORDER — SODIUM CHLORIDE 0.9 % IV SOLN
Freq: Once | INTRAVENOUS | Status: AC
  Filled 2020-08-04: qty 250

## 2020-08-04 MED ORDER — DIPHENHYDRAMINE HCL 25 MG PO CAPS
ORAL_CAPSULE | ORAL | Status: AC
  Filled 2020-08-04: qty 1

## 2020-08-04 MED ORDER — ACETAMINOPHEN 325 MG PO TABS
ORAL_TABLET | ORAL | Status: AC
  Filled 2020-08-04: qty 2

## 2020-08-04 MED ORDER — DIPHENHYDRAMINE HCL 25 MG PO CAPS
25.0000 mg | ORAL_CAPSULE | Freq: Once | ORAL | Status: AC
  Administered 2020-08-04: 25 mg via ORAL

## 2020-08-04 MED ORDER — ACETAMINOPHEN 325 MG PO TABS
650.0000 mg | ORAL_TABLET | Freq: Once | ORAL | Status: AC
  Administered 2020-08-04: 650 mg via ORAL

## 2020-08-04 MED ORDER — SODIUM CHLORIDE 0.9 % IV SOLN
375.0000 mg/m2 | Freq: Once | INTRAVENOUS | Status: AC
  Administered 2020-08-04: 1000 mg via INTRAVENOUS
  Filled 2020-08-04: qty 100

## 2020-08-04 NOTE — Patient Instructions (Signed)
[facility] Discharge Instructions for Patients Receiving Chemotherapy  Today you received the following chemotherapy agents Rituximab-pvvr (RUXIENCE).  To help prevent nausea and vomiting after your treatment, we encourage you to take your nausea medication as prescribed.   If you develop nausea and vomiting that is not controlled by your nausea medication, call the clinic.   BELOW ARE SYMPTOMS THAT SHOULD BE REPORTED IMMEDIATELY:  *FEVER GREATER THAN 100.5 F  *CHILLS WITH OR WITHOUT FEVER  NAUSEA AND VOMITING THAT IS NOT CONTROLLED WITH YOUR NAUSEA MEDICATION  *UNUSUAL SHORTNESS OF BREATH  *UNUSUAL BRUISING OR BLEEDING  TENDERNESS IN MOUTH AND THROAT WITH OR WITHOUT PRESENCE OF ULCERS  *URINARY PROBLEMS  *BOWEL PROBLEMS  UNUSUAL RASH Items with * indicate a potential emergency and should be followed up as soon as possible.  Feel free to call the clinic should you have any questions or concerns. The clinic phone number is [phone].  Please show the CHEMO ALERT CARD at check-in to the Emergency Department and triage nurse.   

## 2020-08-08 ENCOUNTER — Telehealth: Payer: Self-pay | Admitting: Oncology

## 2020-08-08 NOTE — Telephone Encounter (Signed)
Scheduled appts per 8/5 los. Pt confirmed appt date and times.

## 2020-09-02 ENCOUNTER — Telehealth: Payer: Self-pay | Admitting: *Deleted

## 2020-09-02 NOTE — Telephone Encounter (Signed)
Called patient and she is outside of the 10 day window to receive treatment with monoclonal antibody infusion.  She knows to call me however if anything happens and she needs to receive an in person follow up.  I will arrange her in that case with our post covid clinic.    Wilber Bihari, NP

## 2020-09-02 NOTE — Telephone Encounter (Signed)
Pt left VM at 345 today stating she is positive for Covid " just had a prolonged cold and since my kids were back in school - I got a rapid test done and it came back positive."  She states she is scheduled for lab on 9/7 and will not be able to come in and " just wanted to talk with the nurse "  She states she has contacted her primary MD " and am waiting on a return call "  Of note pt has not been vaccinated due to under therapy with Rituxin for ITP.  This RN attempted to return call and obtained identified VM but mailbox was full and could not leave a message.

## 2020-09-06 ENCOUNTER — Inpatient Hospital Stay: Payer: No Typology Code available for payment source | Attending: Oncology

## 2020-10-04 ENCOUNTER — Inpatient Hospital Stay: Payer: PRIVATE HEALTH INSURANCE | Attending: Oncology

## 2020-10-04 ENCOUNTER — Other Ambulatory Visit: Payer: Self-pay

## 2020-10-04 DIAGNOSIS — D693 Immune thrombocytopenic purpura: Secondary | ICD-10-CM | POA: Diagnosis not present

## 2020-10-04 DIAGNOSIS — I10 Essential (primary) hypertension: Secondary | ICD-10-CM

## 2020-10-04 LAB — CBC WITH DIFFERENTIAL/PLATELET
Abs Immature Granulocytes: 0.05 10*3/uL (ref 0.00–0.07)
Basophils Absolute: 0 10*3/uL (ref 0.0–0.1)
Basophils Relative: 0 %
Eosinophils Absolute: 0.3 10*3/uL (ref 0.0–0.5)
Eosinophils Relative: 4 %
HCT: 37.8 % (ref 36.0–46.0)
Hemoglobin: 12.6 g/dL (ref 12.0–15.0)
Immature Granulocytes: 1 %
Lymphocytes Relative: 22 %
Lymphs Abs: 1.7 10*3/uL (ref 0.7–4.0)
MCH: 26.1 pg (ref 26.0–34.0)
MCHC: 33.3 g/dL (ref 30.0–36.0)
MCV: 78.4 fL — ABNORMAL LOW (ref 80.0–100.0)
Monocytes Absolute: 0.7 10*3/uL (ref 0.1–1.0)
Monocytes Relative: 10 %
Neutro Abs: 4.7 10*3/uL (ref 1.7–7.7)
Neutrophils Relative %: 63 %
Platelets: 248 10*3/uL (ref 150–400)
RBC: 4.82 MIL/uL (ref 3.87–5.11)
RDW: 14.6 % (ref 11.5–15.5)
WBC: 7.5 10*3/uL (ref 4.0–10.5)
nRBC: 0 % (ref 0.0–0.2)

## 2020-11-08 ENCOUNTER — Telehealth (INDEPENDENT_AMBULATORY_CARE_PROVIDER_SITE_OTHER): Payer: Self-pay | Admitting: Family Medicine

## 2020-11-08 ENCOUNTER — Encounter: Payer: Self-pay | Admitting: Family Medicine

## 2020-11-08 ENCOUNTER — Other Ambulatory Visit: Payer: Self-pay

## 2020-11-08 VITALS — Wt 289.2 lb

## 2020-11-08 DIAGNOSIS — J302 Other seasonal allergic rhinitis: Secondary | ICD-10-CM

## 2020-11-08 DIAGNOSIS — R7303 Prediabetes: Secondary | ICD-10-CM

## 2020-11-08 DIAGNOSIS — D693 Immune thrombocytopenic purpura: Secondary | ICD-10-CM

## 2020-11-08 DIAGNOSIS — I10 Essential (primary) hypertension: Secondary | ICD-10-CM

## 2020-11-08 DIAGNOSIS — G43109 Migraine with aura, not intractable, without status migrainosus: Secondary | ICD-10-CM

## 2020-11-08 NOTE — Patient Instructions (Signed)
Please return in 3-4 months for cpe with labs.   If you have any questions or concerns, please don't hesitate to send me a message via MyChart or call the office at [phone]. Thank you for visiting with [place] today! It's our pleasure caring for you.

## 2020-11-08 NOTE — Progress Notes (Signed)
Virtual Visit via Video Note  Subjective  CC:  Chief Complaint  Patient presents with  . Hypertension    does not check BP at home  . Migraine    has not recently had migraine   . lymph nodes    noticed swelling about a week ago - no cough, fevers, chills, son was sick with viral infection      I connected with Bailey Sheppard on 11/08/20 at  8:30 AM EST by a video enabled telemedicine application and verified that I am speaking with the correct person using two identifiers. Location patient: Home Location provider: [facility] [place] participating in the virtual visit: Angelee Debara Pickett, MD Reymundo Poll, [place] discussed the limitations of evaluation and management by telemedicine and the availability of in person appointments. The patient expressed understanding and agreed to proceed.  HPI: Bailey Sheppard is a 39 y.o. female who was contacted today to address the problems listed above in the chief complaint/HTN f/u:  Hypertension f/u: Control is good . Pt reports she is doing well. taking medications as instructed, no medication side effects noted, no TIAs, no chest pain on exertion, no dyspnea on exertion, no swelling of ankles. Gets nauseous if missed meds but lasts for 1-2 doses then she is fine. BP at outiside offices have been perfect. She denies adverse effects from his BP medications. Compliance with medication is good.   Migraines on CCB for HTN and prevention: Now doing very well on preventatives. has not had headaches while taking them. Feeling much better  Complains of sinus drainage, occasional sneezing, mild sore throat in the morning. No fevers chills and does not feel sick.  Prediabetes by lab work back in February. Denies symptoms of hyperglycemia. Weight is down 5 pounds by her home scale. She is cooking more at home and trying to eat better. Denies symptoms of hyperglycemia  ITP status post chemotherapy and  stable. Reviewed oncology notes and treatments.  Health maintenance: Due Pap smear and she is to schedule with GYN soon. She is due for flu vaccine Covid vaccine. He has had to be delayed due to her chemotherapy. She can now get them.  Assessment  1. Essential hypertension   2. Prediabetes   3. Migraine with aura and without status migrainosus, not intractable   4. Idiopathic thrombocytopenic purpura ([place])      Plan    Hypertension f/u: BP control is well controlled. Continue calcium channel blocker hydrochlorothiazide.  Prediabetes f/u: Discussed healthy nutrition, weight loss and will recheck levels at next visit. She has no symptoms of hyperglycemia at this time. There is a strong family history of diabetes  Migraines on preventive calcium channel blocker and now well controlled.  ITP is stable. Continue to monitor for symptoms  Patient to schedule Covid and flu vaccine soon  F/u 4 months for cpe and f/u htn and prediabetes  BP Readings from Last 3 Encounters:  08/04/20 116/66  08/04/20 (!) 145/93  07/15/20 (!) 144/96   Wt Readings from Last 3 Encounters:  11/08/20 289 lb 3.2 oz (131.2 kg)  08/04/20 297 lb 4.8 oz (134.9 kg)  07/15/20 293 lb (132.9 kg)    Lab Results  Component Value Date   CHOL 190 02/23/2020   CHOL 185 03/06/2019   Lab Results  Component Value Date   HDL 39.50 02/23/2020   HDL 42 (L) 03/06/2019   Lab Results  Component Value Date  LDLCALC 137 (H) 02/23/2020   LDLCALC 125 (H) 03/06/2019   Lab Results  Component Value Date   TRIG 69.0 02/23/2020   TRIG 81 03/06/2019   Lab Results  Component Value Date   CHOLHDL 5 02/23/2020   CHOLHDL 4.4 03/06/2019   No results found for: LDLDIRECT Lab Results  Component Value Date   CREATININE 0.82 08/04/2020   BUN 12 08/04/2020   NA 139 08/04/2020   K 3.7 08/04/2020   CL 106 08/04/2020   CO2 25 08/04/2020      I reviewed the patients updated PMH, FH, and SocHx.    Patient Active Problem  List   Diagnosis Date Noted  . Prediabetes 02/25/2020    Priority: High  . Migraine with aura and without status migrainosus, not intractable 02/23/2020    Priority: High  . Morbid obesity with BMI of 40.0-44.9, adult ([place]) 12/03/2017    Priority: High  . Essential hypertension 12/20/2016    Priority: High  . Idiopathic thrombocytopenic purpura ([place]) 02/20/2012    Priority: High  . Pure hypercholesterolemia 03/08/2019    Priority: Medium  . IUD (intrauterine device) in place 08/01/2013    Priority: Medium  . Seasonal allergies 11/08/2020    Priority: Low  . Fibroid 05/13/2012    Priority: Low  . Idiopathic thrombocytopenic purpura (ITP) ([facility]) 05/09/2020   Current Meds  Medication Sig  . acetaminophen (TYLENOL) 500 MG tablet Take 500 mg by mouth every 6 (six) hours as needed for headache.  . diltiazem (CARDIZEM CD) 240 MG 24 hr capsule Take 1 capsule (240 mg total) by mouth daily.  . hydrochlorothiazide (HYDRODIURIL) 25 MG tablet Take 1 tablet (25 mg total) by mouth daily.  Marland Kitchen levonorgestrel (MIRENA) 20 MCG/24HR IUD 1 each by Intrauterine route once.  Marland Kitchen MELATONIN GUMMIES PO Take 1 each by mouth at bedtime as needed (sleep).  . Multiple Vitamin (MULTIVITAMIN WITH MINERALS) TABS tablet Take 1 tablet by mouth daily.    Allergies: Patient is allergic to nsaids. Family History: Patient family history includes Diabetes in her brother, father, maternal grandfather, and paternal grandmother; Heart failure in her father; Hypertension in her mother; Kidney failure in her father and maternal grandfather; Lupus in her cousin. Social History:  Patient  reports that she has never smoked. She has never used smokeless tobacco. She reports that she does not drink alcohol and does not use drugs.  Review of Systems: Constitutional: Negative for fever malaise or anorexia Cardiovascular: negative for chest pain Respiratory: negative for SOB or persistent cough Gastrointestinal: negative for  abdominal pain  OBJECTIVE Vitals: Wt 289 lb 3.2 oz (131.2 kg)   BMI 41.50 kg/m  General: no acute distress , A&Ox3  Leamon Arnt, MD

## 2020-11-29 ENCOUNTER — Inpatient Hospital Stay: Payer: No Typology Code available for payment source

## 2020-11-29 ENCOUNTER — Other Ambulatory Visit: Payer: Self-pay

## 2020-11-29 ENCOUNTER — Inpatient Hospital Stay: Payer: No Typology Code available for payment source | Attending: Oncology

## 2020-11-29 DIAGNOSIS — D693 Immune thrombocytopenic purpura: Secondary | ICD-10-CM | POA: Insufficient documentation

## 2020-11-29 DIAGNOSIS — I10 Essential (primary) hypertension: Secondary | ICD-10-CM

## 2020-11-29 DIAGNOSIS — Z6841 Body Mass Index (BMI) 40.0 and over, adult: Secondary | ICD-10-CM

## 2020-11-29 LAB — CBC WITH DIFFERENTIAL/PLATELET
Abs Immature Granulocytes: 0.02 10*3/uL (ref 0.00–0.07)
Basophils Absolute: 0 10*3/uL (ref 0.0–0.1)
Basophils Relative: 1 %
Eosinophils Absolute: 0.3 10*3/uL (ref 0.0–0.5)
Eosinophils Relative: 4 %
HCT: 39.1 % (ref 36.0–46.0)
Hemoglobin: 12.9 g/dL (ref 12.0–15.0)
Immature Granulocytes: 0 %
Lymphocytes Relative: 22 %
Lymphs Abs: 1.7 10*3/uL (ref 0.7–4.0)
MCH: 26.8 pg (ref 26.0–34.0)
MCHC: 33 g/dL (ref 30.0–36.0)
MCV: 81.1 fL (ref 80.0–100.0)
Monocytes Absolute: 0.9 10*3/uL (ref 0.1–1.0)
Monocytes Relative: 11 %
Neutro Abs: 4.9 10*3/uL (ref 1.7–7.7)
Neutrophils Relative %: 62 %
Platelets: 252 10*3/uL (ref 150–400)
RBC: 4.82 MIL/uL (ref 3.87–5.11)
RDW: 15.7 % — ABNORMAL HIGH (ref 11.5–15.5)
WBC: 7.9 10*3/uL (ref 4.0–10.5)
nRBC: 0 % (ref 0.0–0.2)

## 2021-01-10 IMAGING — CR DG KNEE 3 VIEWS*R*
3 series · 3 of 3 positions shown · non-contrast
Comparison: None.

CLINICAL DATA: Bilateral knee pain

EXAM:
LEFT KNEE - 3 VIEW; RIGHT KNEE - 3 VIEW

[t knee ap right]
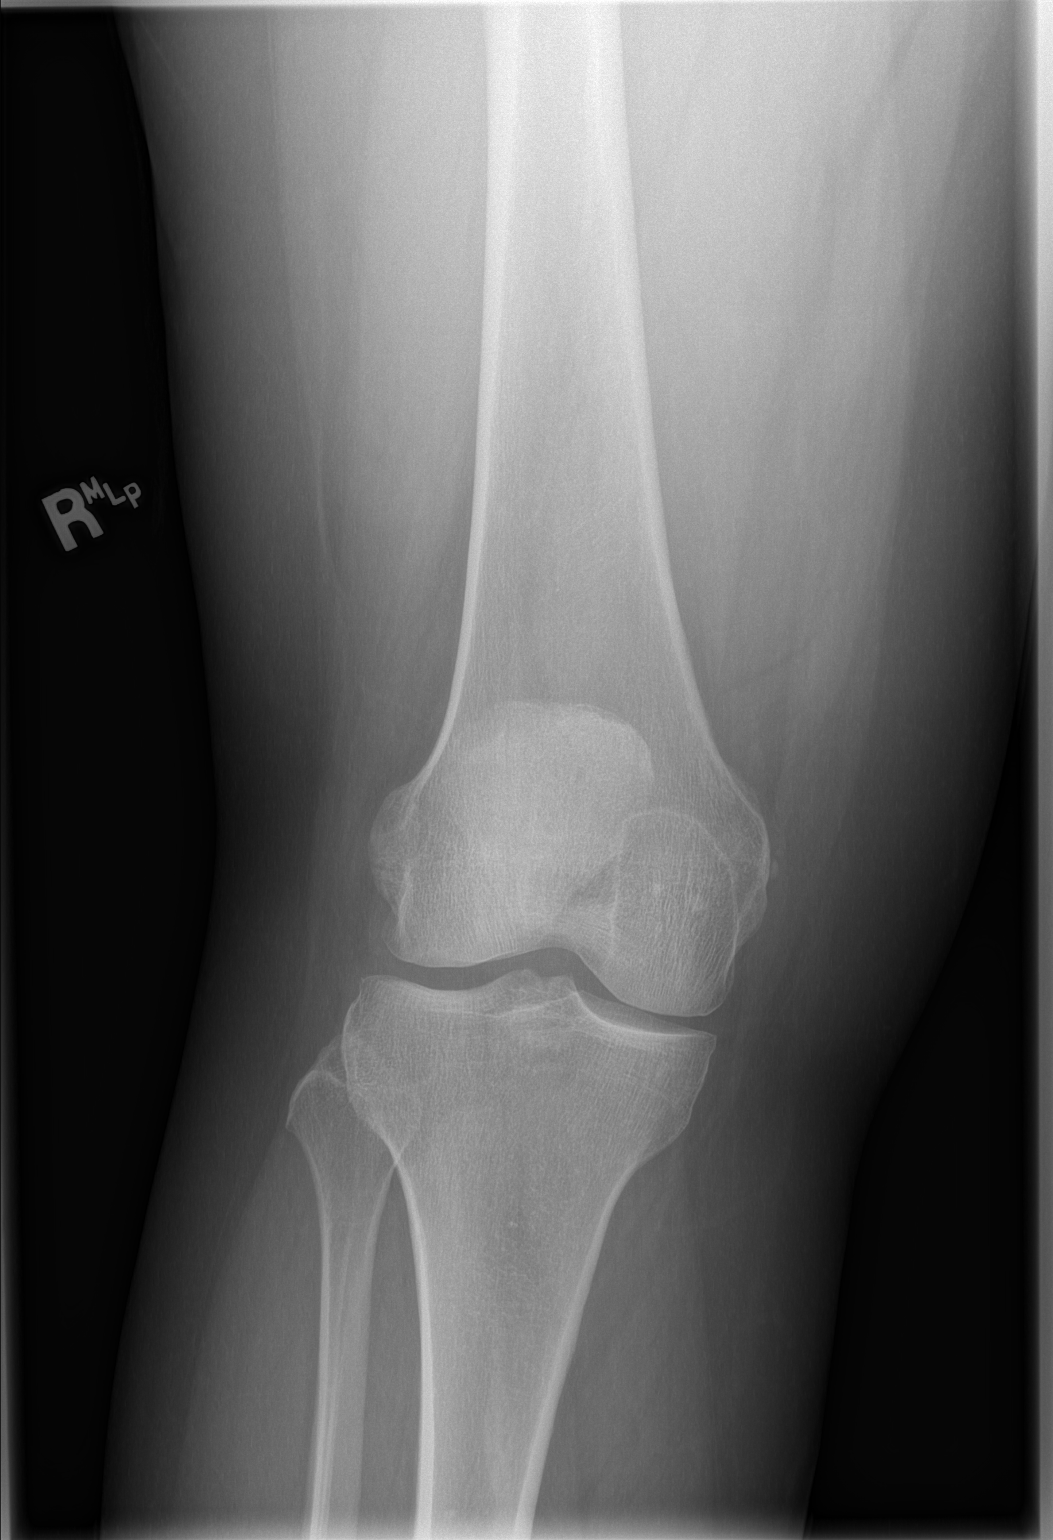

[t knee oblique right (1 of 2)]
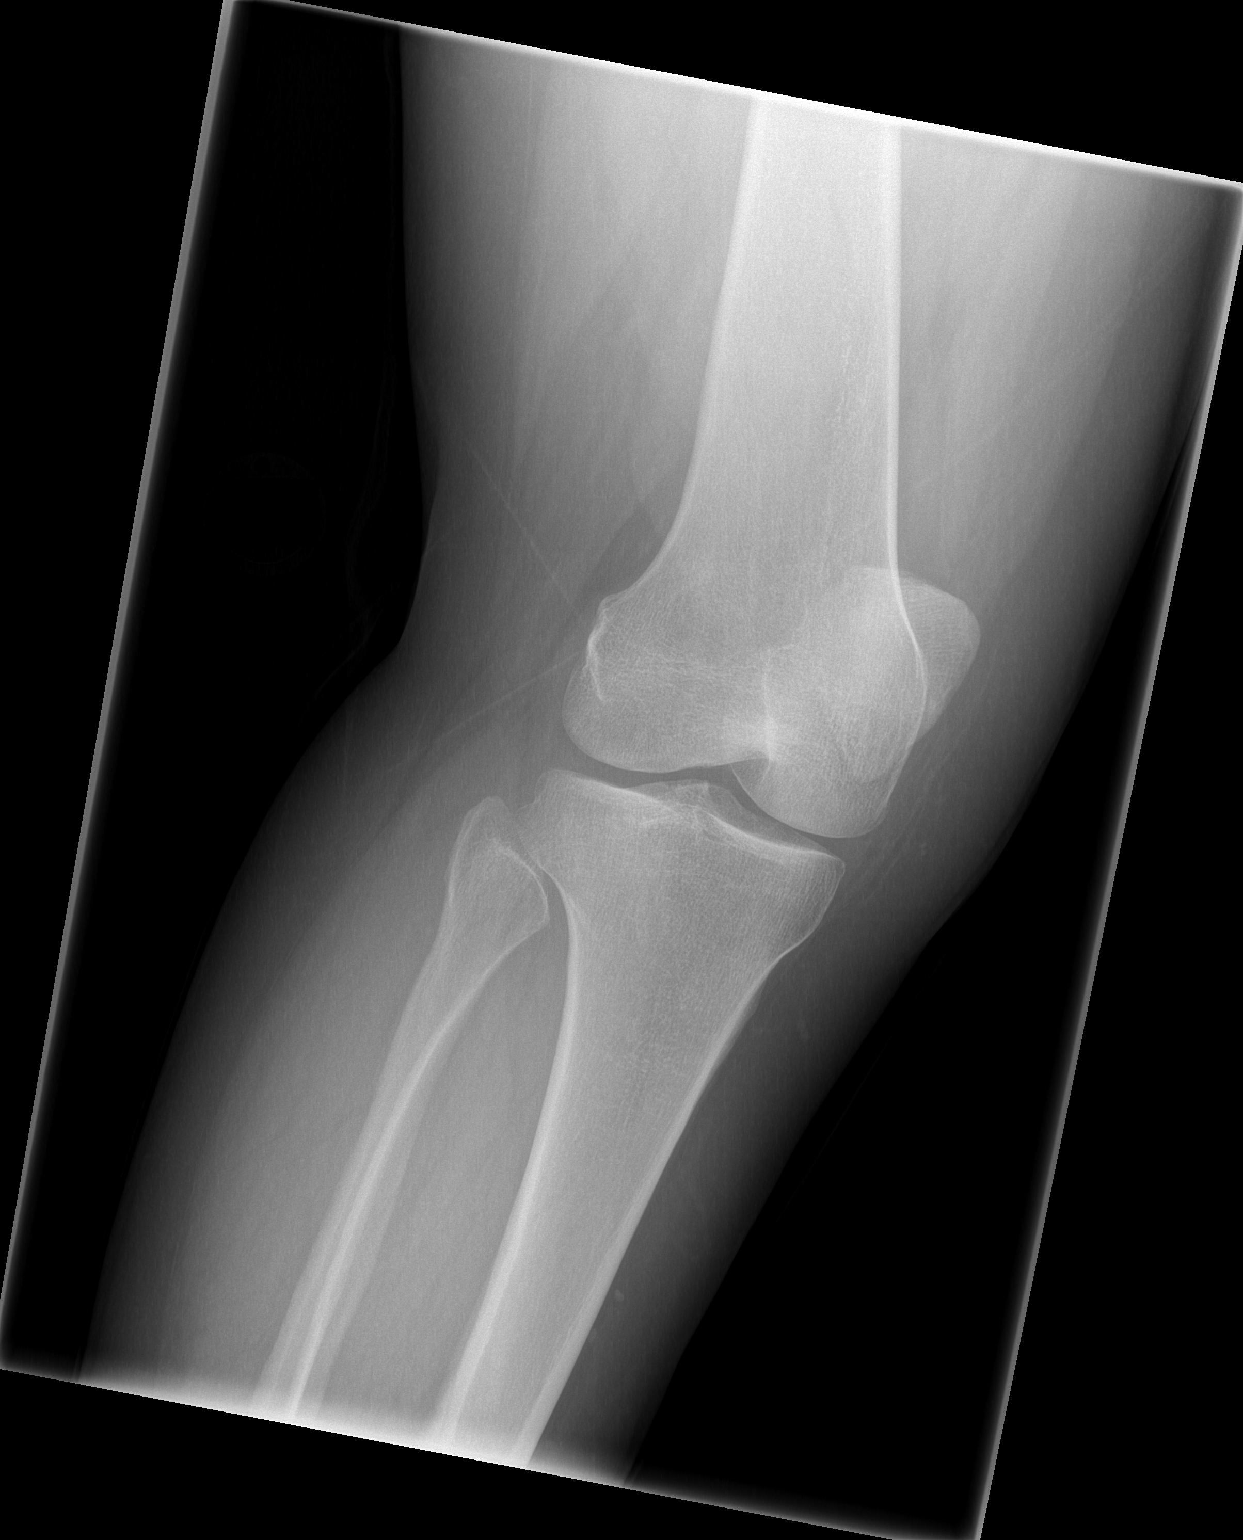

[t knee oblique right (2 of 2)]
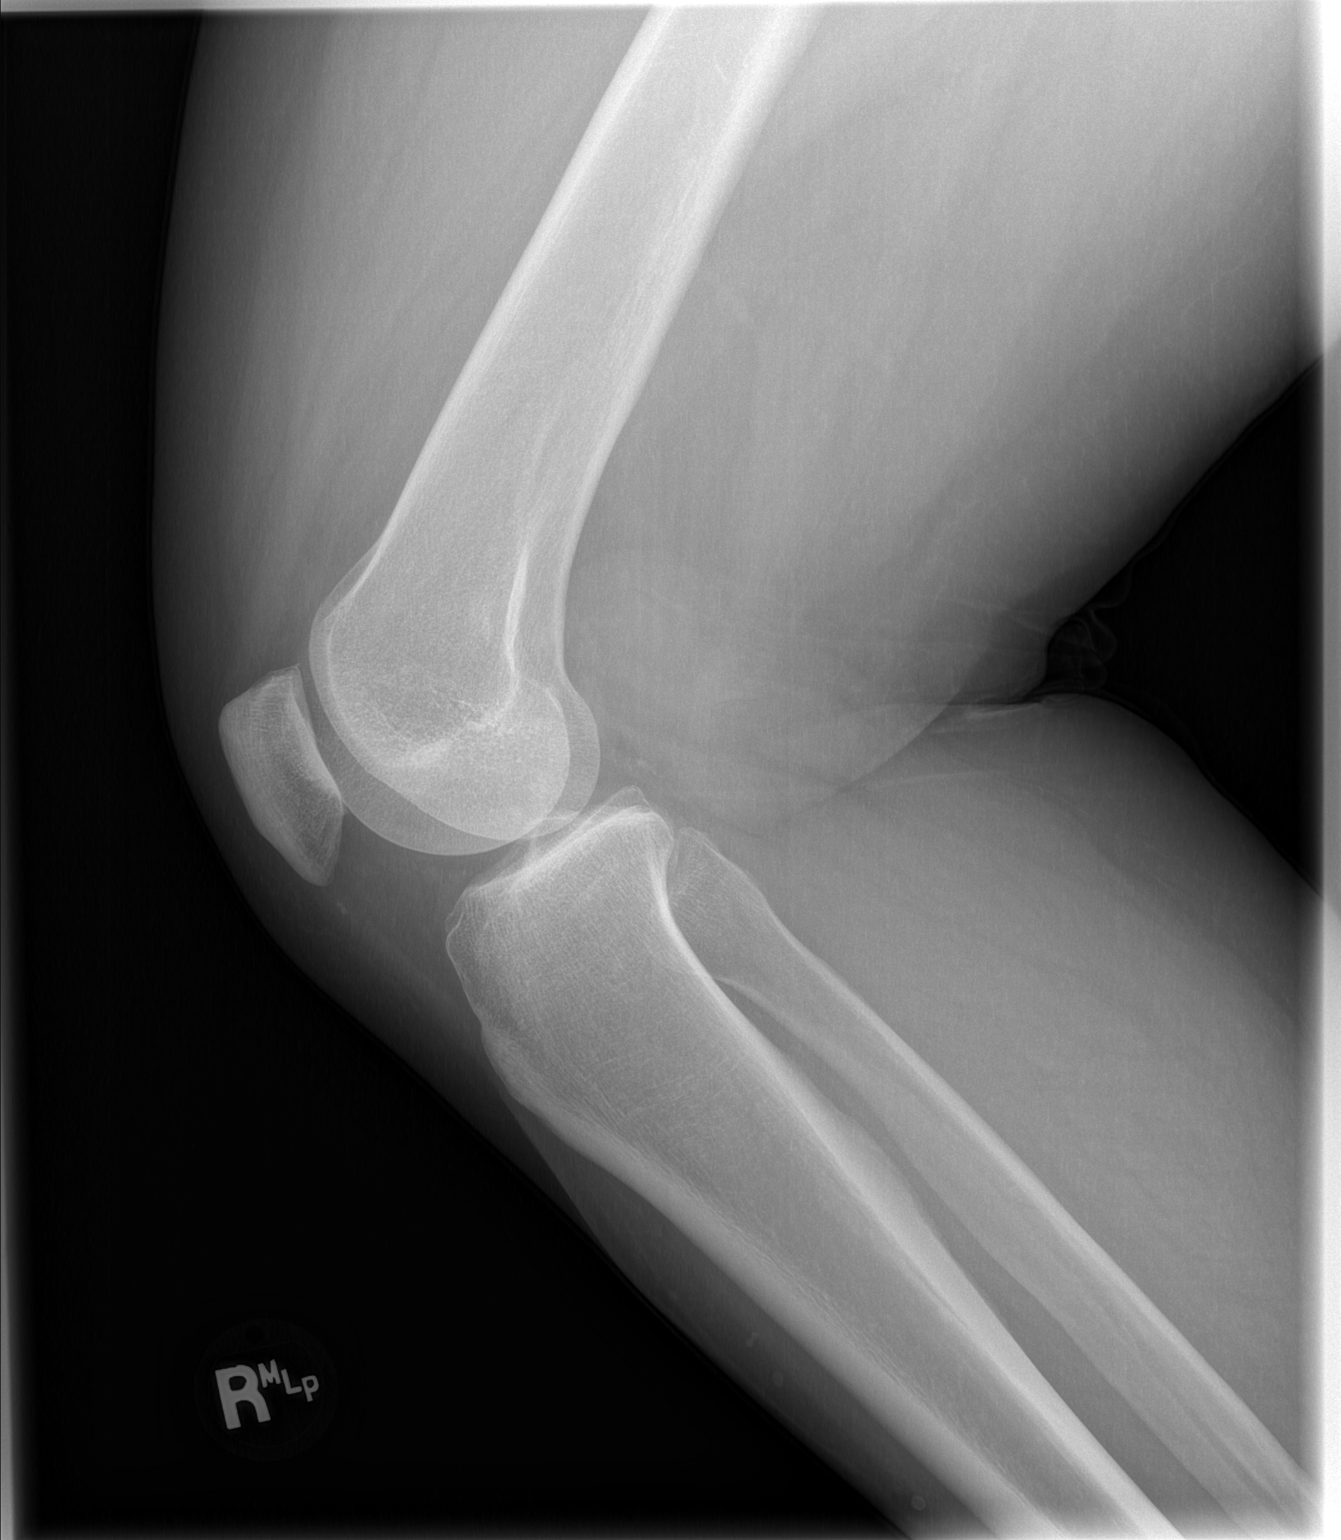

[3 of 3 positions shown; findings below may reference images not displayed]

FINDINGS: No evidence of fracture, dislocation, or joint effusion. No evidence
of arthropathy or other focal bone abnormality. Soft tissues are
unremarkable.
IMPRESSION: No fracture or dislocation of the bilateral knees.

## 2021-01-10 IMAGING — CR DG KNEE 3 VIEWS*L*
3 series · 3 of 3 positions shown · non-contrast
Comparison: None.

CLINICAL DATA: Bilateral knee pain

EXAM:
LEFT KNEE - 3 VIEW; RIGHT KNEE - 3 VIEW

[t knee ap left]
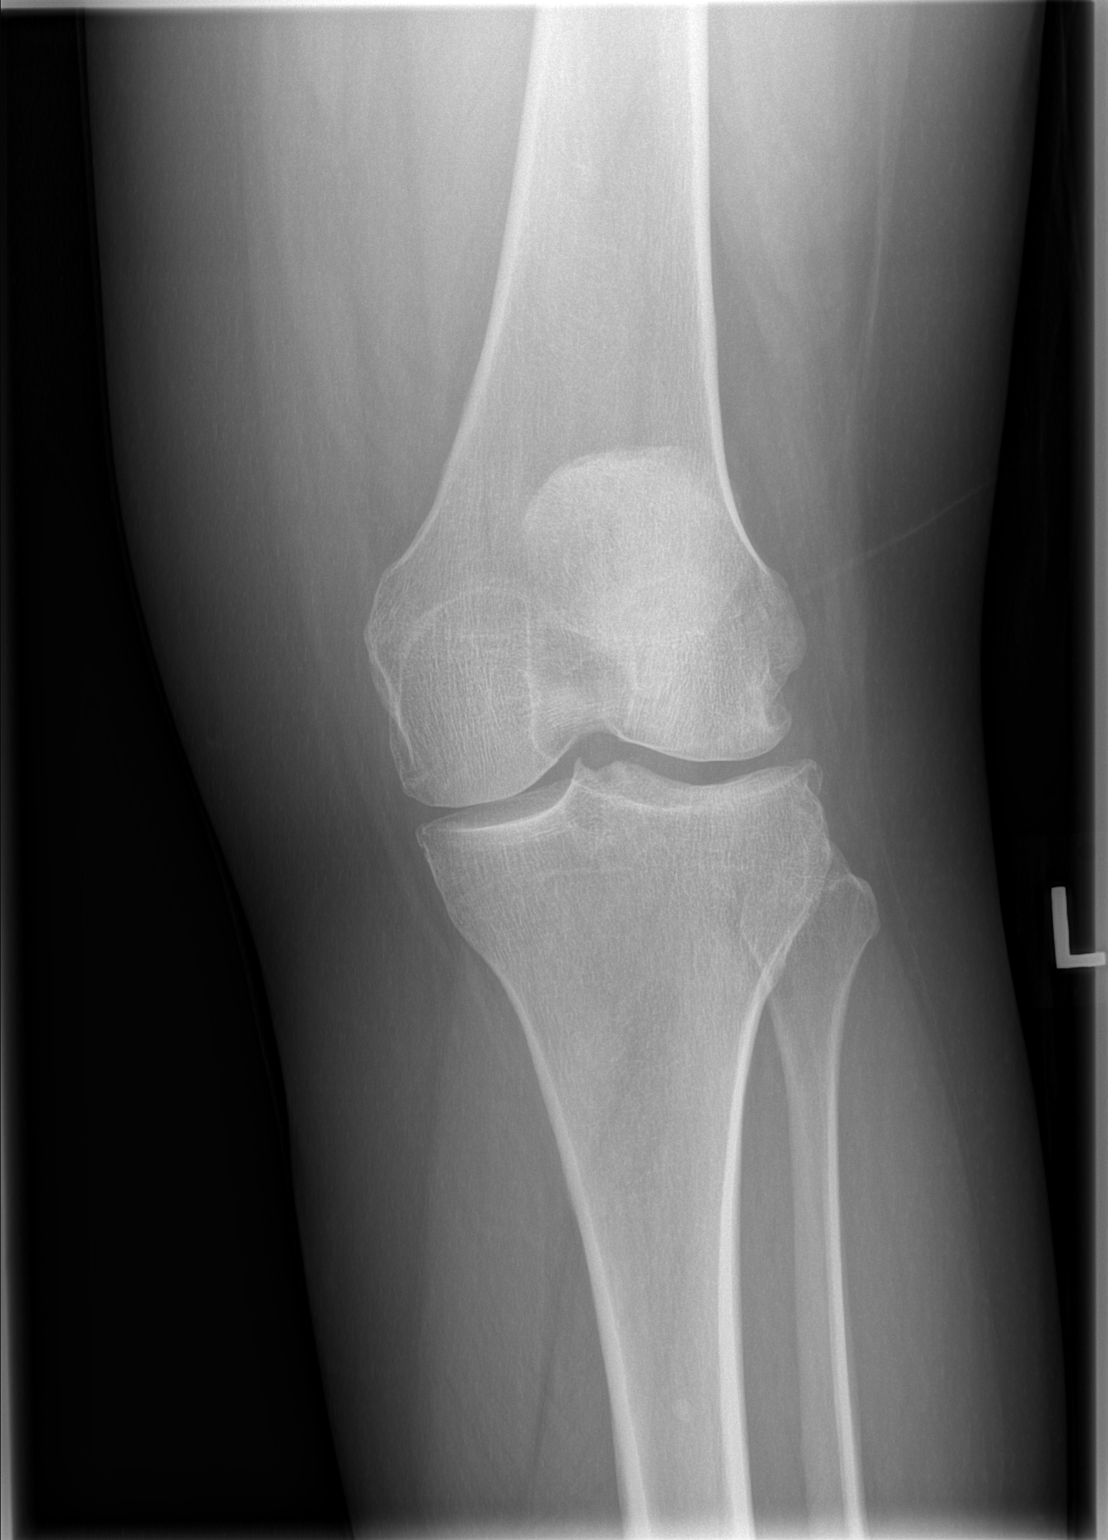

[t knee oblique left]
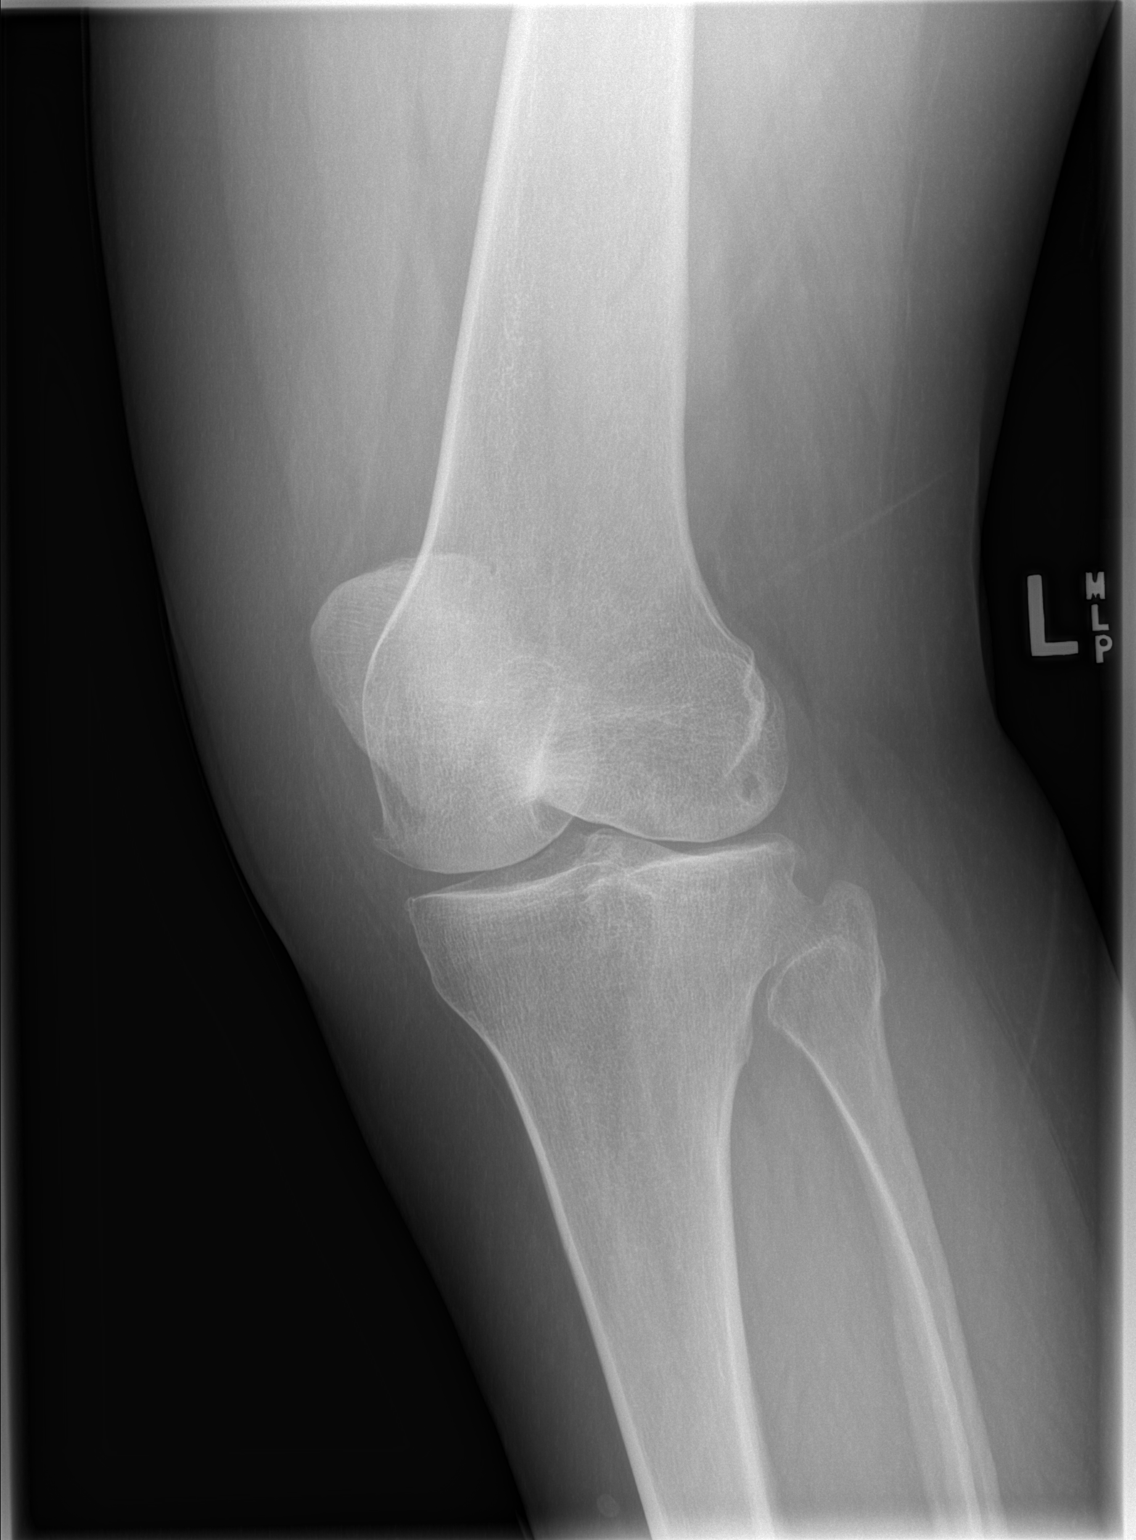

[t knee lat left]
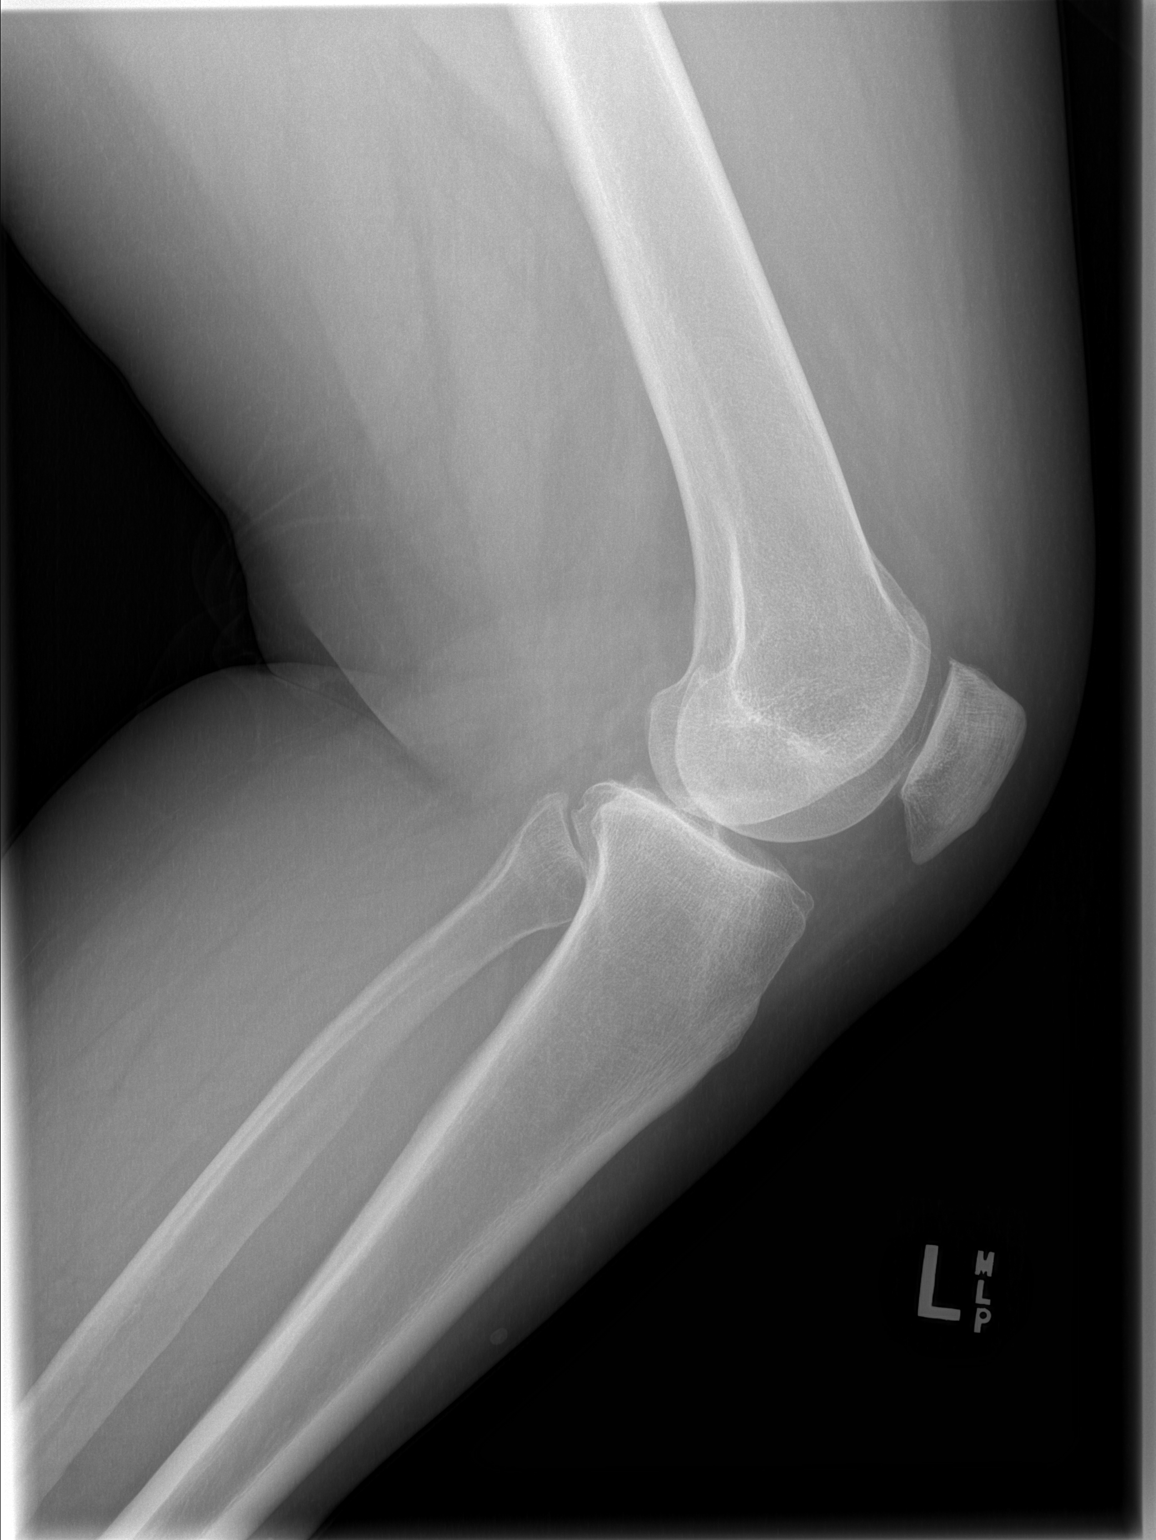

[3 of 3 positions shown; findings below may reference images not displayed]

FINDINGS: No evidence of fracture, dislocation, or joint effusion. No evidence
of arthropathy or other focal bone abnormality. Soft tissues are
unremarkable.
IMPRESSION: No fracture or dislocation of the bilateral knees.

## 2021-01-24 ENCOUNTER — Inpatient Hospital Stay: Payer: No Typology Code available for payment source | Attending: Oncology

## 2021-02-02 ENCOUNTER — Encounter: Payer: Self-pay | Admitting: Family Medicine

## 2021-02-02 ENCOUNTER — Telehealth (INDEPENDENT_AMBULATORY_CARE_PROVIDER_SITE_OTHER): Payer: BC Managed Care – PPO | Admitting: Family Medicine

## 2021-02-02 ENCOUNTER — Other Ambulatory Visit: Payer: Self-pay

## 2021-02-02 DIAGNOSIS — J309 Allergic rhinitis, unspecified: Secondary | ICD-10-CM | POA: Diagnosis not present

## 2021-02-02 DIAGNOSIS — I10 Essential (primary) hypertension: Secondary | ICD-10-CM | POA: Diagnosis not present

## 2021-02-02 DIAGNOSIS — Z7185 Encounter for immunization safety counseling: Secondary | ICD-10-CM

## 2021-02-02 MED ORDER — DILTIAZEM HCL ER COATED BEADS 240 MG PO CP24: 240.0000 mg | ORAL_CAPSULE | Freq: Every day | ORAL | 3 refills | Status: DC

## 2021-02-02 MED ORDER — FLUTICASONE PROPIONATE 50 MCG/ACT NA SUSP: 1.0000 | Freq: Every day | NASAL | 6 refills | Status: AC

## 2021-02-02 MED ORDER — HYDROCHLOROTHIAZIDE 25 MG PO TABS: 25.0000 mg | ORAL_TABLET | Freq: Every day | ORAL | 3 refills | Status: DC

## 2021-02-02 NOTE — Progress Notes (Signed)
Virtual Visit via Video Note  Subjective  CC:  Chief Complaint  Patient presents with  . Nasal Congestion    Was seen in November for viral infection, all other symptoms have gone away - but she continues to have sinus congestion. No relief with any OTC medications, requesting to start antibiotic. Has only had 1 COVID vaccine, no flu shot this year, and no recent COVID tests      I connected with Bailey Sheppard on 02/02/21 at  9:00 AM EST by a video enabled telemedicine application and verified that I am speaking with the correct person using two identifiers. Location patient: Home Location provider: [facility] [place], Office Persons participating in the virtual visit: Bailey Sheppard, Leamon Arnt, MD Reymundo Poll CMA  I discussed the limitations of evaluation and management by telemedicine and the availability of in person appointments. The patient expressed understanding and agreed to proceed. HPI: Bailey Sheppard is a 40 y.o. female who was contacted today to address the problems listed above in the chief complaint. . 40 year old with allergies complains of 71-month history of postnasal drainage, cough secondary to drainage, hoarseness without fever, chills, sinus or dental pain or shortness of breath.  This started back in the winter when she had a viral infection.  Both of her children had Covid at the time.  Her viral symptoms resolved however she has persistent drainage.  She also was complaining about this when I saw her in November for blood pressure follow-up.  She takes Claritin only intermittently.  She denies significant wheezing.  She has no malaise.  She otherwise feels well.  Her mother thought she might need antibiotics.  She has no history of sinus infections. . Hypertension: She reports home blood pressure readings have been good although she continues to miss medications intermittently.  She is due for refill.  No chest pain, shortness of  breath or lower extremity edema.  She tolerates her medicine well.  Health maintenance: She is due for complete physical, Pap smear, she is eligible for Covid and influenza vaccinations. Assessment  1. Chronic allergic rhinitis   2. Essential hypertension   3. Vaccine counseling      Plan   Allergies: Symptoms are most consistent with chronic postnasal drainage from allergies.  No signs or symptoms of infection at this point.  Start Flonase twice daily for the first week then daily.  Start Zyrtec.  Recheck in 2 to 3 weeks if not improving.  Hypertension: Reportedly good control.  Refill medications.  Recommend office visit for blood pressure check and lab work in 3 months.  Health maintenance: Discussed vaccinations.  Recommend getting her second Covid vaccine and flu shot at her convenience.  Also recommend complete physical with Pap smear  Patient is scheduled I discussed the assessment and treatment plan with the patient. The patient was provided an opportunity to ask questions and all were answered. The patient agreed with the plan and demonstrated an understanding of the instructions.   The patient was advised to call back or seek an in-person evaluation if the symptoms worsen or if the condition fails to improve as anticipated. Follow up: 3 months for hypertension recheck and physical with Pap Visit date not found  Meds ordered this encounter  Medications  . fluticasone (FLONASE) 50 MCG/ACT nasal spray    Sig: Place 1 spray into both nostrils daily.    Dispense:  16 g    Refill:  6  .  diltiazem (CARDIZEM CD) 240 MG 24 hr capsule    Sig: Take 1 capsule (240 mg total) by mouth daily.    Dispense:  90 capsule    Refill:  3  . hydrochlorothiazide (HYDRODIURIL) 25 MG tablet    Sig: Take 1 tablet (25 mg total) by mouth daily.    Dispense:  90 tablet    Refill:  3      I reviewed the patients updated PMH, FH, and SocHx.    Patient Active Problem List   Diagnosis Date Noted   . Idiopathic thrombocytopenic purpura (ITP) (HCC) 05/09/2020    Priority: High  . Prediabetes 02/25/2020    Priority: High  . Migraine with aura and without status migrainosus, not intractable 02/23/2020    Priority: High  . Morbid obesity with BMI of 40.0-44.9, adult ([place]) 12/03/2017    Priority: High  . Essential hypertension 12/20/2016    Priority: High  . Idiopathic thrombocytopenic purpura ([place]) 02/20/2012    Priority: High  . Pure hypercholesterolemia 03/08/2019    Priority: Medium  . IUD (intrauterine device) in place 08/01/2013    Priority: Medium  . Seasonal allergies 11/08/2020    Priority: Low  . Fibroid 05/13/2012    Priority: Low   Current Meds  Medication Sig  . fluticasone (FLONASE) 50 MCG/ACT nasal spray Place 1 spray into both nostrils daily.  Marland Kitchen levonorgestrel (MIRENA) 20 MCG/24HR IUD 1 each by Intrauterine route once.  . [DISCONTINUED] diltiazem (CARDIZEM CD) 240 MG 24 hr capsule Take 1 capsule (240 mg total) by mouth daily.  . [DISCONTINUED] hydrochlorothiazide (HYDRODIURIL) 25 MG tablet Take 1 tablet (25 mg total) by mouth daily.    Allergies: Patient is allergic to nsaids. Family History: Patient family history includes Diabetes in her brother, father, maternal grandfather, and paternal grandmother; Heart failure in her father; Hypertension in her mother; Kidney failure in her father and maternal grandfather; Lupus in her cousin. Social History:  Patient  reports that she has never smoked. She has never used smokeless tobacco. She reports that she does not drink alcohol and does not use drugs.  Review of Systems: Constitutional: Negative for fever malaise or anorexia Cardiovascular: negative for chest pain Respiratory: negative for SOB or persistent cough Gastrointestinal: negative for abdominal pain  OBJECTIVE Vitals: There were no vitals taken for this visit. General: no acute distress , A&Ox3, appears well no facial swelling no cough  Leamon Arnt, MD

## 2021-04-17 NOTE — Progress Notes (Signed)
Bailey Sheppard  [phone] [phone]) [phone]     ID: Bailey Sheppard DOB: 02-12-1981  MR#: [id number]  QVZ#:[id number]  Patient Care Team: Leamon Arnt, MD as PCP - General (Family Medicine) Armarion Greek, Virgie Dad, MD as Consulting Physician (Hematology and Oncology) Yisroel Ramming, Everardo All, MD as Consulting Physician (Obstetrics and Gynecology) Chauncey Cruel, MD OTHER MD:  CHIEF COMPLAINT: Immune thrombocytopenia  CURRENT TREATMENT: Observation   INTERVAL HISTORY: Bailey Sheppard returns today for follow up of her immune thrombocytopenia. She is now under observation.  Since her last visit here she has had no petechiae, ecchymoses, or overt bleeding.   REVIEW OF SYSTEMS: Bailey Sheppard is very frustrated because she cannot lose weight.  She is going to the gym and working out 30 minutes 5 days a week.  She tells me she tried a strict keto diet about 2 years ago and lost no weight.  She also finds her work very stressful.  Family is fine and she enjoys taking her children around to their basketball game and other sports   Bailey Sheppard x1 in 11/2020   HISTORY OF CURRENT ILLNESS: From the May 2021 intake note:  I last saw [name] in 2019.  At that time she continued in remission from her ITP. She has been stable off treatment until this weekend, 05/07/2020, when she developed a bruise on her leg. She then had blood on her toothbrush. Finally she developed petechiae and "that's when I started paying attention," as she has had them before. She called her primary MD Dr Jonni Sanger who checked a CBC showing a platelet count of <10K with otherwise normal counts. The patient called our office and was directed to the ED.  I met with [name] in the emergency room 05/10/2020.  At that time her platelet count was 7000.  We started her on steroids and IVIG and by the next day her platelets were just over 20,000.  She had a very rapid recovery, received 2  doses of IVIG total, and was discharged on 8 mg of dexamethasone twice daily.  She started on weekly rituximab and her steroid taper on 05/13/2020.  Going back 2 weeks she is not aware of any intercurrent infections or change in medications.  Her 68 year old son is a Associate Professor and they have traveled to [place] in [place] in the last 2 weekends.  They will had some sinus issues but no fever or symptoms of flu.  She has not received a coronavirus vaccine.  The patient's subsequent history is as detailed below.   PAST MEDICAL HISTORY: Past Medical History:  Diagnosis Date  . Acute pericarditis, unspecified 06/11/2013  . Anemia   . Anxiety   . Chronic hypertension during pregnancy 09/17/2018  . Depression   . Elevated hemoglobin A1c 2018   5.7  . Fibroids   . Hypertension   . ITP (idiopathic thrombocytopenic purpura)   . Obesity   . Vaginal Pap smear, abnormal     PAST SURGICAL HISTORY: Past Surgical History:  Procedure Laterality Date  . INTRAUTERINE DEVICE (IUD) INSERTION  04/2012  . UMBILICAL HERNIA REPAIR      FAMILY HISTORY Family History  Problem Relation Age of Onset  . Hypertension Mother   . Diabetes Father   . Kidney failure Father   . Heart failure Father   . Diabetes Brother   . Lupus Cousin   . Diabetes Maternal Grandfather   . Kidney failure Maternal Grandfather   . Diabetes  Paternal Grandmother     GYNECOLOGIC HISTORY:  G3 P3, Menarche age 46. Age of first live birth: 46.    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  Her husband was a Software engineer at a private school but passed away suddenly, at home, from a presumed myocardial infarction, February 2021.  (His family has a strong history of heart attacks and strokes).. Their 3 sons  are currently 37, 65, and less than 48 years old    ADVANCED DIRECTIVES: To be discussed   HEALTH MAINTENANCE: Social History   Tobacco  Use  . Smoking status: Never Smoker  . Smokeless tobacco: Never Used  Vaping Use  . Vaping Use: Never used  Substance Use Topics  . Alcohol use: No  . Drug use: No      Allergies  Allergen Reactions  . Nsaids     ITP    Current Outpatient Medications  Medication Sig Dispense Refill  . acetaminophen (TYLENOL) 500 MG tablet Take 500 mg by mouth every 6 (six) hours as needed for headache. (Patient not taking: Reported on 02/02/2021)    . diltiazem (CARDIZEM CD) 240 MG 24 hr capsule Take 1 capsule (240 mg total) by mouth daily. 90 capsule 3  . fluticasone (FLONASE) 50 MCG/ACT nasal spray Place 1 spray into both nostrils daily. 16 g 6  . hydrochlorothiazide (HYDRODIURIL) 25 MG tablet Take 1 tablet (25 mg total) by mouth daily. 90 tablet 3  . levonorgestrel (MIRENA) 20 MCG/24HR IUD 1 each by Intrauterine route once.     No current facility-administered medications for this visit.    OBJECTIVE: African-American woman in no acute distress  Vitals:   04/18/21 1253  BP: (!) 142/82  Pulse: (!) 56  Resp: 20  Temp: 97.7 F (36.5 C)  SpO2: 100%     Body mass index is 42.43 kg/m.   Wt Readings from Last 3 Encounters:  04/18/21 295 lb 11.2 oz (134.1 kg)  11/08/20 289 lb 3.2 oz (131.2 kg)  08/04/20 297 lb 4.8 oz (134.9 kg)     ECOG FS:1 - Symptomatic but completely ambulatory  Sclerae unicteric, EOMs intact Wearing a mask No cervical or supraclavicular adenopathy Lungs no rales or rhonchi Heart regular rate and rhythm Abd soft, nontender, positive bowel sounds MSK no focal spinal tenderness, no upper extremity lymphedema Neuro: nonfocal, well oriented, appropriate affect   LAB RESULTS:  CMP     Component Value Date/Time   NA 139 08/04/2020 1232   NA 140 09/13/2014 1400   K 3.7 08/04/2020 1232   K 3.7 09/13/2014 1400   CL 106 08/04/2020 1232   CL 105 06/19/2013 0845   CO2 25 08/04/2020 1232   CO2 24 09/13/2014 1400   GLUCOSE 90 08/04/2020 1232   GLUCOSE 110  09/13/2014 1400   GLUCOSE 97 06/19/2013 0845   BUN 12 08/04/2020 1232   BUN 9.7 09/13/2014 1400   CREATININE 0.82 08/04/2020 1232   CREATININE 0.88 05/13/2020 0755   CREATININE 0.76 03/06/2019 1440   CREATININE 0.8 09/13/2014 1400   CALCIUM 9.8 08/04/2020 1232   CALCIUM 9.1 09/13/2014 1400   PROT 6.8 08/04/2020 1232   PROT 7.1 09/13/2014 1400   ALBUMIN 4.1 08/04/2020 1232   ALBUMIN 4.0 09/13/2014 1400   AST 12 (L) 08/04/2020 1232   AST 42 (H) 05/13/2020 0755   AST 14 09/13/2014 1400   ALT 11 08/04/2020 1232   ALT 73 (H) 05/13/2020  0755   ALT 13 09/13/2014 1400   ALKPHOS 67 08/04/2020 1232   ALKPHOS 53 09/13/2014 1400   BILITOT 0.8 08/04/2020 1232   BILITOT 1.0 05/13/2020 0755   BILITOT 0.95 09/13/2014 1400   GFRNONAA >60 08/04/2020 1232   GFRNONAA >60 05/13/2020 0755   GFRAA >60 08/04/2020 1232   GFRAA >60 05/13/2020 0755    No results found for: TOTALPROTELP, ALBUMINELP, A1GS, A2GS, BETS, BETA2SER, GAMS, MSPIKE, SPEI  No results found for: KPAFRELGTCHN, LAMBDASER, KAPLAMBRATIO  Lab Results  Component Value Date   WBC 7.8 04/18/2021   NEUTROABS 5.1 04/18/2021   HGB 13.2 04/18/2021   HCT 39.8 04/18/2021   MCV 82.2 04/18/2021   PLT 235 04/18/2021   No results found for: LABCA2  No components found for: VWUJWJ191  No results for input(s): INR in the last 168 hours.  No results found for: LABCA2  No results found for: YNW295  No results found for: AOZ308  No results found for: MVH846  No results found for: CA2729  No components found for: HGQUANT  No results found for: CEA1 / No results found for: CEA1   No results found for: AFPTUMOR  No results found for: HGBA, HGBA2QUANT, HGBFQUANT, HGBSQUAN (Hemoglobinopathy evaluation)   Lab Results  Component Value Date   LDH 159 07/14/2018    Lab Results  Component Value Date   IRON 49 04/25/2012   TIBC 391 04/25/2012   IRONPCTSAT 13 (L) 04/25/2012   (Iron and TIBC)  Lab Results  Component Value  Date   FERRITIN 29 12/02/2018    Urinalysis    Component Value Date/Time   COLORURINE YELLOW 01/21/2014 2018   APPEARANCEUR CLEAR 01/21/2014 2018   [place] 1.022 01/21/2014 2018   LABSPEC 1.005 06/19/2013 1127   PHURINE 6.0 01/21/2014 2018   GLUCOSEU NEGATIVE 01/21/2014 2018   GLUCOSEU Negative 06/19/2013 [place] 01/21/2014 2018   [place] NEGATIVE 01/21/2014 2018   BILIRUBINUR Negative 06/19/2013 [place] 01/21/2014 2018   PROTEINUR NEGATIVE 01/21/2014 2018   UROBILINOGEN 0.2 01/21/2014 2018   UROBILINOGEN 0.2 06/19/2013 1127   NITRITE NEGATIVE 01/21/2014 2018   LEUKOCYTESUR NEGATIVE 01/21/2014 2018   LEUKOCYTESUR [place] 06/19/2013 1127    STUDIES: No results found.   ELIGIBLE FOR AVAILABLE RESEARCH PROTOCOL: no  ASSESSMENT: 40 y.o. [place], [place] woman:  1.  ITP, requiring hospitalization in June 2014. Patient was treated with IV dexamethasone and IVIG. She was then on a dexamethasone taper between 06/22/2013 and 07/12/2013. She received 6 weekly doses of rituximab, followed by 2 q. three-week doses, with the final dose given on 08/21/2013.  2. History of iron deficiency anemia,             (a) IV Feraheme was given in March 2013.             (b) received repeat IV Feraheme on 07/17/2018 and 07/24/2018 for a ferritin less than 6  3. Acute pericarditis - resolved   4.  Pregnancy complicated by iron deficiency and thrombocytopenia: Resolved  5.  Recurrent ITP 05/09/2020: platelet count 7000  (a) dexamethasone started 05/09/2020, tapered to off  (b) IVIG given 05/09/2020 and 05/10/2020  (c) rituximab started 05/13/2020, completed 9 cycles at increasing intervals 08/04/2020   (i) negative hepatitis B surface antigen August 2018 and core antibody June 2014   (ii) repeat hepatitis B studies 05/20/2020 negative   PLAN: Evonne is now a year out from initial diagnosis of severe thrombocytopenia from immune  causes.  She has  been off treatment for about a year with no evidence of recurrence.  This is very favorable.  I would like to check her platelet count in 6 months but if it remains in the normal range around then we will release her from follow-up here.  She is very frustrated by the difficulty losing weight.  The equation of course is brutal, simply calories in minus calories out equals weight change.  She is already doing an excellent job at staying fit by going to the gym and exercising 30 minutes 5 times a week.  She will need to cut back on the calories if she wishes to lose weight and of course this is not a matter of a week or a month but something that will take years.  The encouraging thing though is that she is staying fit, has a good family life and good employment.  I think if she puts her head to it she will achieve any weight that she wishes to attain  Total encounter time 20 minutes.Chauncey Cruel, MD   04/18/2021 6:00 PM Medical Oncology and Hematology [facility] [place], [place] [address] Tel. [phone]    Fax. [phone]   I, Wilburn Mylar, am acting as scribe for Dr. Virgie Dad. Jakub Debold.  I, Lurline Del MD, have reviewed the above documentation for accuracy and completeness, and I agree with the above.   *Total Encounter Time as defined by the Centers for Medicare and Medicaid Services includes, in addition to the face-to-face time of a patient visit (documented in the note above) non-face-to-face time: obtaining and reviewing outside history, ordering and reviewing medications, tests or procedures, care coordination (communications with other health care professionals or caregivers) and documentation in the medical record.

## 2021-04-18 ENCOUNTER — Inpatient Hospital Stay: Payer: BC Managed Care – PPO | Attending: Oncology

## 2021-04-18 ENCOUNTER — Inpatient Hospital Stay (HOSPITAL_BASED_OUTPATIENT_CLINIC_OR_DEPARTMENT_OTHER): Payer: BC Managed Care – PPO | Admitting: Oncology

## 2021-04-18 ENCOUNTER — Other Ambulatory Visit: Payer: Self-pay

## 2021-04-18 VITALS — BP 142/82 | HR 56 | Temp 97.7°F | Resp 20 | Ht 70.0 in | Wt 295.7 lb

## 2021-04-18 DIAGNOSIS — D693 Immune thrombocytopenic purpura: Secondary | ICD-10-CM

## 2021-04-18 DIAGNOSIS — Z79899 Other long term (current) drug therapy: Secondary | ICD-10-CM | POA: Diagnosis not present

## 2021-04-18 DIAGNOSIS — E669 Obesity, unspecified: Secondary | ICD-10-CM | POA: Diagnosis not present

## 2021-04-18 DIAGNOSIS — I10 Essential (primary) hypertension: Secondary | ICD-10-CM | POA: Insufficient documentation

## 2021-04-18 LAB — CBC WITH DIFFERENTIAL/PLATELET
Abs Immature Granulocytes: 0.02 10*3/uL (ref 0.00–0.07)
Basophils Absolute: 0 10*3/uL (ref 0.0–0.1)
Basophils Relative: 0 %
Eosinophils Absolute: 0.2 10*3/uL (ref 0.0–0.5)
Eosinophils Relative: 3 %
HCT: 39.8 % (ref 36.0–46.0)
Hemoglobin: 13.2 g/dL (ref 12.0–15.0)
Immature Granulocytes: 0 %
Lymphocytes Relative: 20 %
Lymphs Abs: 1.5 10*3/uL (ref 0.7–4.0)
MCH: 27.3 pg (ref 26.0–34.0)
MCHC: 33.2 g/dL (ref 30.0–36.0)
MCV: 82.2 fL (ref 80.0–100.0)
Monocytes Absolute: 0.9 10*3/uL (ref 0.1–1.0)
Monocytes Relative: 12 %
Neutro Abs: 5.1 10*3/uL (ref 1.7–7.7)
Neutrophils Relative %: 65 %
Platelets: 235 10*3/uL (ref 150–400)
RBC: 4.84 MIL/uL (ref 3.87–5.11)
RDW: 14.6 % (ref 11.5–15.5)
WBC: 7.8 10*3/uL (ref 4.0–10.5)
nRBC: 0 % (ref 0.0–0.2)

## 2021-04-20 ENCOUNTER — Telehealth: Payer: Self-pay | Admitting: Oncology

## 2021-04-20 NOTE — Telephone Encounter (Signed)
Scheduled per 4/19 los. Called and spoke with pt confirmed 11/2 appts

## 2021-10-25 ENCOUNTER — Encounter: Payer: Self-pay | Admitting: Oncology

## 2021-10-31 NOTE — Progress Notes (Signed)
[place]  [phone] [phone]     ID: Bailey Sheppard DOB: 1981/12/16  MR#: [id number]  EEF#:[id number]  Patient Care Team: Leamon Arnt, MD as PCP - General (Family Medicine) Nunzio Cobbs, MD as Consulting Physician (Obstetrics and Gynecology) Chauncey Cruel, MD OTHER MD:  CHIEF COMPLAINT: Immune thrombocytopenia  CURRENT TREATMENT: Observation   INTERVAL HISTORY: Bailey Sheppard returns today for follow up of her immune thrombocytopenia. She is now under observation.  She has been off treatment since August 2021  Since her last visit here she has had no petechiae, ecchymoses, or overt bleeding.   REVIEW OF SYSTEMS: Jazmen is not exercising much at all.  She is very busy with her 3 children 2 of whom are in sports and she has a new job with [employer].  A detailed review of systems today was otherwise stable   COVID 19 VACCINATION STATUS: [place] x2; also had COVID in September 2021   HISTORY OF CURRENT ILLNESS: From the May 2021 intake note:  I last saw [name] in 2019.  At that time she continued in remission from her ITP. She has been stable off treatment until this weekend, 05/07/2020, when she developed a bruise on her leg. She then had blood on her toothbrush. Finally she developed petechiae and "that's when I started paying attention," as she has had them before. She called her primary MD Dr Jonni Sanger who checked a CBC showing a platelet count of <10K with otherwise normal counts. The patient called our office and was directed to the ED.  I met with Bailey Sheppard in the emergency room 05/10/2020.  At that time her platelet count was 7000.  We started her on steroids and IVIG and by the next day her platelets were just over 20,000.  She had a very rapid recovery, received 2 doses of IVIG total, and was discharged on 8 mg of dexamethasone twice daily.  She started on weekly rituximab and her steroid taper on 05/13/2020.  Going back 2  weeks she is not aware of any intercurrent infections or change in medications.  Her 73 year old son is a Associate Professor and they have traveled to [place] in [place] in the last 2 weekends.  They will had some sinus issues but no fever or symptoms of flu.  She has not received a coronavirus vaccine.  The patient's subsequent history is as detailed below.   PAST MEDICAL HISTORY: Past Medical History:  Diagnosis Date   Acute pericarditis, unspecified 06/11/2013   Anemia    Anxiety    Chronic hypertension during pregnancy 09/17/2018   Depression    Elevated hemoglobin A1c 2018   5.7   Fibroids    Hypertension    ITP (idiopathic thrombocytopenic purpura)    Obesity    Vaginal Pap smear, abnormal     PAST SURGICAL HISTORY: Past Surgical History:  Procedure Laterality Date   INTRAUTERINE DEVICE (IUD) INSERTION  88/3254   UMBILICAL HERNIA REPAIR      FAMILY HISTORY Family History  Problem Relation Age of Onset   Hypertension Mother    Diabetes Father    Kidney failure Father    Heart failure Father    Diabetes Brother    Lupus Cousin    Diabetes Maternal Grandfather    Kidney failure Maternal Grandfather    Diabetes Paternal Grandmother     GYNECOLOGIC HISTORY:  G3 P3, Menarche age 46. Age of first live birth: 34.    SOCIAL HISTORY: Updated  November 2022 Shavana worked as an Pharmacist, community for [employer] but more recently she has been working for [employer] in a similar capacity.Marland Kitchen  Her husband was a Software engineer at a private school but passed away suddenly, at home, from a presumed myocardial infarction, February 2021.  (His family has a strong history of heart attacks and strokes).Their 3 sons  are currently 59, 90, and 52 years old.  ADVANCED DIRECTIVES: To be discussed   HEALTH MAINTENANCE: Social History   Tobacco Use   Smoking status: Never   Smokeless tobacco: Never  Vaping Use   Vaping Use: Never used   Substance Use Topics   Alcohol use: No   Drug use: No      Allergies  Allergen Reactions   Nsaids     ITP    Current Outpatient Medications  Medication Sig Dispense Refill   acetaminophen (TYLENOL) 500 MG tablet Take 500 mg by mouth every 6 (six) hours as needed for headache. (Patient not taking: Reported on 02/02/2021)     diltiazem (CARDIZEM CD) 240 MG 24 hr capsule Take 1 capsule (240 mg total) by mouth daily. 90 capsule 3   fluticasone (FLONASE) 50 MCG/ACT nasal spray Place 1 spray into both nostrils daily. 16 g 6   hydrochlorothiazide (HYDRODIURIL) 25 MG tablet Take 1 tablet (25 mg total) by mouth daily. 90 tablet 3   levonorgestrel (MIRENA) 20 MCG/24HR IUD 1 each by Intrauterine route once.     No current facility-administered medications for this visit.    OBJECTIVE: African-American woman who appears well  Vitals:   11/01/21 1414  BP: 135/78  Pulse: 83  Resp: 16  Temp: 97.9 F (36.6 C)  SpO2: 100%      Body mass index is 41.93 kg/m.   Wt Readings from Last 3 Encounters:  11/01/21 292 lb 3.2 oz (132.5 kg)  04/18/21 295 lb 11.2 oz (134.1 kg)  11/08/20 289 lb 3.2 oz (131.2 kg)     ECOG FS:1 - Symptomatic but completely ambulatory  Sclerae unicteric, EOMs intact Wearing a mask No cervical or supraclavicular adenopathy Lungs no rales or rhonchi Heart regular rate and rhythm Abd soft, nontender, no palpable organomegaly MSK no focal spinal tenderness Neuro: nonfocal, well oriented, appropriate affect Breasts: Deferred   LAB RESULTS:  CMP     Component Value Date/Time   NA 139 08/04/2020 1232   NA 140 09/13/2014 1400   K 3.7 08/04/2020 1232   K 3.7 09/13/2014 1400   CL 106 08/04/2020 1232   CL 105 06/19/2013 0845   CO2 25 08/04/2020 1232   CO2 24 09/13/2014 1400   GLUCOSE 90 08/04/2020 1232   GLUCOSE 110 09/13/2014 1400   GLUCOSE 97 06/19/2013 0845   BUN 12 08/04/2020 1232   BUN 9.7 09/13/2014 1400   CREATININE 0.82 08/04/2020 1232    CREATININE 0.88 05/13/2020 0755   CREATININE 0.76 03/06/2019 1440   CREATININE 0.8 09/13/2014 1400   CALCIUM 9.8 08/04/2020 1232   CALCIUM 9.1 09/13/2014 1400   PROT 6.8 08/04/2020 1232   PROT 7.1 09/13/2014 1400   ALBUMIN 4.1 08/04/2020 1232   ALBUMIN 4.0 09/13/2014 1400   AST 12 (L) 08/04/2020 1232   AST 42 (H) 05/13/2020 0755   AST 14 09/13/2014 1400   ALT 11 08/04/2020 1232   ALT 73 (H) 05/13/2020 0755   ALT 13 09/13/2014 1400   ALKPHOS 67 08/04/2020 1232   ALKPHOS 53 09/13/2014 1400  BILITOT 0.8 08/04/2020 1232   BILITOT 1.0 05/13/2020 0755   BILITOT 0.95 09/13/2014 1400   GFRNONAA >60 08/04/2020 1232   GFRNONAA >60 05/13/2020 0755   GFRAA >60 08/04/2020 1232   GFRAA >60 05/13/2020 0755    No results found for: TOTALPROTELP, ALBUMINELP, A1GS, A2GS, BETS, BETA2SER, GAMS, MSPIKE, SPEI  No results found for: KPAFRELGTCHN, LAMBDASER, [facility]  Lab Results  Component Value Date   WBC 7.0 11/01/2021   NEUTROABS 5.1 11/01/2021   HGB 12.9 11/01/2021   HCT 38.2 11/01/2021   MCV 80.4 11/01/2021   PLT 214 11/01/2021   No results found for: LABCA2  No components found for: IOXBDZ329  No results for input(s): INR in the last 168 hours.  No results found for: LABCA2  No results found for: JME268  No results found for: TMH962  No results found for: IWL798  No results found for: CA2729  No components found for: HGQUANT  No results found for: CEA1 / No results found for: CEA1   No results found for: AFPTUMOR  No results found for: HGBA, HGBA2QUANT, HGBFQUANT, HGBSQUAN (Hemoglobinopathy evaluation)   Lab Results  Component Value Date   LDH 159 07/14/2018    Lab Results  Component Value Date   IRON 49 04/25/2012   TIBC 391 04/25/2012   IRONPCTSAT 13 (L) 04/25/2012   (Iron and TIBC)  Lab Results  Component Value Date   FERRITIN 29 12/02/2018    Urinalysis    Component Value Date/Time   COLORURINE YELLOW 01/21/2014 2018   APPEARANCEUR CLEAR  01/21/2014 2018   [place] 1.022 01/21/2014 2018   LABSPEC 1.005 06/19/2013 1127   PHURINE 6.0 01/21/2014 2018   GLUCOSEU NEGATIVE 01/21/2014 2018   GLUCOSEU Negative 06/19/2013 [place] 01/21/2014 2018   [place] NEGATIVE 01/21/2014 2018   BILIRUBINUR Negative 06/19/2013 [place] 01/21/2014 2018   PROTEINUR NEGATIVE 01/21/2014 2018   UROBILINOGEN 0.2 01/21/2014 2018   UROBILINOGEN 0.2 06/19/2013 1127   NITRITE NEGATIVE 01/21/2014 2018   LEUKOCYTESUR NEGATIVE 01/21/2014 2018   LEUKOCYTESUR [place] 06/19/2013 1127    STUDIES: No results found.   ELIGIBLE FOR AVAILABLE RESEARCH PROTOCOL: no  ASSESSMENT: 40 y.o. [place] woman:   1.  ITP, requiring hospitalization in June 2014. Patient was treated with IV dexamethasone and IVIG. She was then on a dexamethasone taper between 06/22/2013 and 07/12/2013. She received 6 weekly doses of rituximab, followed by 2 q. three-week doses, with the final dose given on 08/21/2013.   2. History of iron deficiency anemia,             (a) IV Feraheme was given in March 2013.             (b) received repeat IV Feraheme on 07/17/2018 and 07/24/2018 for a ferritin less than 6   3. Acute pericarditis - resolved    4.  Pregnancy complicated by iron deficiency and thrombocytopenia: Resolved  5.  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  The fact that  she had COVID and also received vaccines without triggering another  bout of ITP is reassuring.  Of course ITP can recur at any time but in many cases it never does.  At this point I am comfortable releasing her from follow-up here.  Of course I will be glad to see her again at any point in the future if and when the need arises.  Total encounter time 20 minutes.Chauncey Cruel, MD   11/01/2021 2:28 PM Medical Oncology and Hematology [facility] [place], [place] [address] Tel. [phone]    Fax. [phone]   I, Wilburn Mylar, am acting as scribe for Dr. Virgie Dad. Sya Nestler.  I, Lurline Del MD, have reviewed the above documentation for accuracy and completeness, and I agree with the above.   *Total Encounter Time as defined by the Centers for Medicare and Medicaid Services includes, in addition to the face-to-face time of a patient visit (documented in the note above) non-face-to-face time: obtaining and reviewing outside history, ordering and reviewing medications, tests or procedures, care coordination (communications with other health care professionals or caregivers) and documentation in the medical record.

## 2021-11-01 ENCOUNTER — Other Ambulatory Visit: Payer: Self-pay

## 2021-11-01 ENCOUNTER — Inpatient Hospital Stay: Payer: Self-pay

## 2021-11-01 ENCOUNTER — Inpatient Hospital Stay: Payer: BC Managed Care – PPO | Attending: Oncology | Admitting: Oncology

## 2021-11-01 VITALS — BP 135/78 | HR 83 | Temp 97.9°F | Resp 16 | Ht 70.0 in | Wt 292.2 lb

## 2021-11-01 DIAGNOSIS — Z8616 Personal history of COVID-19: Secondary | ICD-10-CM | POA: Insufficient documentation

## 2021-11-01 DIAGNOSIS — D693 Immune thrombocytopenic purpura: Secondary | ICD-10-CM | POA: Insufficient documentation

## 2021-11-01 DIAGNOSIS — I10 Essential (primary) hypertension: Secondary | ICD-10-CM | POA: Insufficient documentation

## 2021-11-01 DIAGNOSIS — I309 Acute pericarditis, unspecified: Secondary | ICD-10-CM | POA: Insufficient documentation

## 2021-11-01 LAB — CBC WITH DIFFERENTIAL/PLATELET
Abs Immature Granulocytes: 0.03 10*3/uL (ref 0.00–0.07)
Basophils Absolute: 0 10*3/uL (ref 0.0–0.1)
Basophils Relative: 0 %
Eosinophils Absolute: 0.2 10*3/uL (ref 0.0–0.5)
Eosinophils Relative: 2 %
HCT: 38.2 % (ref 36.0–46.0)
Hemoglobin: 12.9 g/dL (ref 12.0–15.0)
Immature Granulocytes: 0 %
Lymphocytes Relative: 15 %
Lymphs Abs: 1.1 10*3/uL (ref 0.7–4.0)
MCH: 27.2 pg (ref 26.0–34.0)
MCHC: 33.8 g/dL (ref 30.0–36.0)
MCV: 80.4 fL (ref 80.0–100.0)
Monocytes Absolute: 0.6 10*3/uL (ref 0.1–1.0)
Monocytes Relative: 8 %
Neutro Abs: 5.1 10*3/uL (ref 1.7–7.7)
Neutrophils Relative %: 75 %
Platelets: 214 10*3/uL (ref 150–400)
RBC: 4.75 MIL/uL (ref 3.87–5.11)
RDW: 15.4 % (ref 11.5–15.5)
WBC: 7 10*3/uL (ref 4.0–10.5)
nRBC: 0 % (ref 0.0–0.2)

## 2022-01-31 ENCOUNTER — Other Ambulatory Visit: Payer: Self-pay

## 2022-02-09 ENCOUNTER — Other Ambulatory Visit: Payer: Self-pay

## 2022-02-09 ENCOUNTER — Encounter: Payer: Self-pay | Admitting: Family Medicine

## 2022-02-09 ENCOUNTER — Ambulatory Visit (INDEPENDENT_AMBULATORY_CARE_PROVIDER_SITE_OTHER): Payer: BC Managed Care – PPO | Admitting: Family Medicine

## 2022-02-09 VITALS — BP 118/84 | HR 91 | Temp 98.5°F | Ht 70.0 in | Wt 294.4 lb

## 2022-02-09 DIAGNOSIS — R7303 Prediabetes: Secondary | ICD-10-CM | POA: Diagnosis not present

## 2022-02-09 DIAGNOSIS — I10 Essential (primary) hypertension: Secondary | ICD-10-CM | POA: Diagnosis not present

## 2022-02-09 DIAGNOSIS — D693 Immune thrombocytopenic purpura: Secondary | ICD-10-CM

## 2022-02-09 DIAGNOSIS — Z975 Presence of (intrauterine) contraceptive device: Secondary | ICD-10-CM

## 2022-02-09 DIAGNOSIS — Z6841 Body Mass Index (BMI) 40.0 and over, adult: Secondary | ICD-10-CM

## 2022-02-09 DIAGNOSIS — Z0000 Encounter for general adult medical examination without abnormal findings: Secondary | ICD-10-CM | POA: Diagnosis not present

## 2022-02-09 MED ORDER — OZEMPIC (0.25 OR 0.5 MG/DOSE) 2 MG/1.5ML ~~LOC~~ SOPN: PEN_INJECTOR | SUBCUTANEOUS | 3 refills | Status: DC

## 2022-02-09 MED ORDER — HYDROCHLOROTHIAZIDE 25 MG PO TABS: 25.0000 mg | ORAL_TABLET | Freq: Every day | ORAL | 3 refills | Status: DC

## 2022-02-09 NOTE — Progress Notes (Signed)
Subjective  Chief Complaint  Patient presents with   Annual Exam    Fasting    HPI: Bailey Sheppard is a 41 y.o. female who presents to [facility] today for a Female Wellness Visit.  She also has the concerns and/or needs as listed above in the chief complaint. These will be addressed in addition to the Health Maintenance Visit.   Wellness Visit: annual visit with health maintenance review and exam without Pap  Health maintenance: Had recent visit with GYN office.  I reviewed records.  Had normal Pap smear in January with negative high-risk HPV.  Had normal mammogram.  Had lab work done including CMP, CBC, TSH, A1c, lipid profile, vitamin D.  Chronic disease management visit and/or acute problem visit: Hypertension: Well-controlled at GYN office and here today.  On diltiazem and HCTZ.  Needs refill of HCTZ.  No chest pain, palpitations or lower extremity edema.  Feels well.  Has improved compliance with medication by taking it daily, she keeps it in her car now. Prediabetes: Most recent A1c was 5.4 at GYN office.  Working hard on diet. ITP: Levels are stable Morbid obesity: Now eating a healthier diet.  Cooks most meals at home.  Exercises intermittent.  Interested in weight loss medications.  She has tried and failed other means of weight loss including diets.  Assessment  1. Annual physical exam   2. Essential hypertension   3. Prediabetes   4. Idiopathic thrombocytopenic purpura (ITP) (HCC)   5. Morbid obesity with BMI of 40.0-44.9, adult ([place])   6. IUD (intrauterine device) in place       Plan  Female Wellness Visit: Age appropriate Health Maintenance and Prevention measures were discussed with patient. Included topics are cancer screening recommendations, ways to keep healthy (see AVS) including dietary and exercise recommendations, regular eye and dental care, use of seat belts, and avoidance of moderate alcohol use and tobacco use.  Screens are  current BMI: discussed patient's BMI and encouraged positive lifestyle modifications to help get to or maintain a target BMI. HM needs and immunizations were addressed and ordered. See below for orders. See HM and immunization section for updates.  Had flu, declines flu shot this year but will get in the fall Routine labs and screening tests ordered including cmp, cbc and lipids where appropriate. Discussed recommendations regarding Vit D and calcium supplementation (see AVS)  Chronic disease f/u and/or acute problem visit: (deemed necessary to be done in addition to the wellness visit): Morbid obesity.:  Counseling education given.  GLP-1 agonist would be a great choice given her history of prediabetes.  We will see if we can get it approved for her.  Discussed how it works, expectations, side effects, titration.  Follow-up 3 months.  See after visit summary for exercise recommendations ITP: Stable per hematology.  Continue to monitor Prediabetes: Continue with diet changes diet controlled. Hypertension: Well-controlled on HCTZ and Cardizem.  Normal renal function and electrolytes by recent lab work Follow up: Return in about 3 months (around 05/09/2022) for weight recheck and 6 months for bp recheck.   No orders of the defined types were placed in this encounter.  Meds ordered this encounter  Medications   hydrochlorothiazide (HYDRODIURIL) 25 MG tablet    Sig: Take 1 tablet (25 mg total) by mouth daily.    Dispense:  90 tablet    Refill:  3   Semaglutide,0.25 or 0.5MG /DOS, (OZEMPIC, 0.25 OR 0.5 MG/DOSE,) 2 MG/1.5ML SOPN  Sig: Inject 0.25 mg into the skin once a week for 28 days, THEN 0.5 mg once a week.    Dispense:  1.5 mL    Refill:  3    Start PA if needed please       Body mass index is 42.24 kg/m. Wt Readings from Last 3 Encounters:  02/09/22 294 lb 6.4 oz (133.5 kg)  11/01/21 292 lb 3.2 oz (132.5 kg)  04/18/21 295 lb 11.2 oz (134.1 kg)     Patient Active Problem List    Diagnosis Date Noted   Idiopathic thrombocytopenic purpura (ITP) (HCC) 05/09/2020    Priority: High   Prediabetes 02/25/2020    Priority: High    a1c 6.1 01/2020    Migraine with aura and without status migrainosus, not intractable 02/23/2020    Priority: High   Morbid obesity with BMI of 40.0-44.9, adult ([place]) 12/03/2017    Priority: High   Essential hypertension 12/20/2016    Priority: High   Idiopathic thrombocytopenic purpura ([place]) 02/20/2012    Priority: High   Pure hypercholesterolemia 03/08/2019    Priority: Medium    IUD (intrauterine device) in place 08/01/2013    Priority: Medium     Postpartum 2020, Mirena Birth control and menorrhagia related anemia    Seasonal allergies 11/08/2020    Priority: Low   Fibroid 05/13/2012    Priority: [place] Maintenance  Topic Date Due   COVID-19 Vaccine (2 - Pfizer series) 12/30/2020   INFLUENZA VACCINE  03/30/2022 (Originally 07/31/2021)   PAP SMEAR-Modifier  01/03/2027   TETANUS/TDAP  07/30/2027   Hepatitis C Screening  Completed   HIV Screening  Completed   HPV VACCINES  Aged Out   Immunization History  Administered Date(s) Administered   Influenza, Seasonal, Injecte, Preservative Fre 10/31/2014, 11/29/2015   Influenza,inj,Quad PF,6+ Mos 10/19/2016, 01/10/2018, 09/19/2018   PFIZER(Purple Top)SARS-COV-2 Vaccination 12/09/2020   Tdap 07/29/2017   We updated and reviewed the patient's past history in detail and it is documented below. Allergies: Patient  reports no history of alcohol use. Past Medical History Patient  has a past medical history of Acute pericarditis, unspecified (06/11/2013), Anemia, Anxiety, Chronic hypertension during pregnancy (09/17/2018), Depression, Elevated hemoglobin A1c (2018), Fibroids, Hypertension, ITP (idiopathic thrombocytopenic purpura), Obesity, and Vaginal Pap smear, abnormal. Past Surgical History Patient  has a past surgical history that includes Umbilical hernia repair and  Intrauterine device (iud) insertion (04/2012). Social History   Socioeconomic History   Marital status: Soil scientist    Spouse name: donnell   Number of children: 3   Years of education: Not on file   Highest education level: Not on file  Occupational History   Occupation: Estate manager/land agent.    Employer: [employer]  Tobacco Use   Smoking status: Never   Smokeless tobacco: Never  Vaping Use   Vaping Use: Never used  Substance and Sexual Activity   Alcohol use: No   Drug use: No   Sexual activity: Yes    Partners: Male    Birth control/protection: I.U.D.  Other Topics Concern   Not on file  Social History Narrative   Has 3 children   Social Determinants of Health   Financial Resource Strain: Not on file  Food Insecurity: Not on file  Transportation Needs: Not on file  Physical Activity: Not on file  Stress: Not on file  Social Connections: Not on file   Family History  Problem Relation Age of Onset   Hypertension Mother  Diabetes Father    Kidney failure Father    Heart failure Father    Diabetes Brother    Lupus Cousin    Diabetes Maternal Grandfather    Kidney failure Maternal Grandfather    Diabetes Paternal Grandmother     Review of Systems: Constitutional: negative for fever or malaise Ophthalmic: negative for photophobia, double vision or loss of vision Cardiovascular: negative for chest pain, dyspnea on exertion, or new LE swelling Respiratory: negative for SOB or persistent cough Gastrointestinal: negative for abdominal pain, change in bowel habits or melena Genitourinary: negative for dysuria or gross hematuria, no abnormal uterine bleeding or disharge Musculoskeletal: negative for new gait disturbance or muscular weakness Integumentary: negative for new or persistent rashes, no breast lumps Neurological: negative for TIA or stroke symptoms Psychiatric: negative for SI or delusions Allergic/Immunologic: negative for hives  Patient  Care Team    Relationship Specialty Notifications Start End  Leamon Arnt, MD PCP - General Family Medicine  02/23/20   Nunzio Cobbs, MD Consulting Physician Obstetrics and Gynecology  07/15/18     Objective  Vitals: BP 118/84    Pulse 91    Temp 98.5 F (36.9 C) (Temporal)    Ht 5\' 10"  (1.778 m)    Wt 294 lb 6.4 oz (133.5 kg)    SpO2 96%    BMI 42.24 kg/m  General:  Well developed, well nourished, no acute distress  Psych:  Alert and orientedx3,normal mood and affect HEENT:  Normocephalic, atraumatic, non-icteric sclera, PERRL, supple neck without adenopathy, mass or thyromegaly Cardiovascular:  Normal S1, S2, RRR without gallop, rub or murmur Respiratory:  Good breath sounds bilaterally, CTAB with normal respiratory effort Gastrointestinal: normal bowel sounds, soft, non-tender, no noted masses. No HSM MSK: no deformities, contusions. Joints are without erythema or swelling.  Skin:  Warm, no rashes or suspicious lesions noted Neurologic:    Mental status is normal. Gross motor and sensory exams are normal. Normal gait. No tremor    Commons side effects, risks, benefits, and alternatives for medications and treatment plan prescribed today were discussed, and the patient expressed understanding of the given instructions. Patient is instructed to call or message via MyChart if he/she has any questions or concerns regarding our treatment plan. No barriers to understanding were identified. We discussed Red Flag symptoms and signs in detail. Patient expressed understanding regarding what to do in case of urgent or emergency type symptoms.  Medication list was reconciled, printed and provided to the patient in AVS. Patient instructions and summary information was reviewed with the patient as documented in the AVS. This note was prepared with assistance of Dragon voice recognition software. Occasional wrong-word or sound-a-like substitutions may have occurred due to the inherent  limitations of voice recognition software  This visit occurred during the SARS-CoV-2 public health emergency.  Safety protocols were in place, including screening questions prior to the visit, additional usage of staff PPE, and extensive cleaning of exam room while observing appropriate contact time as indicated for disinfecting solutions.

## 2022-02-09 NOTE — Patient Instructions (Signed)
Please return in 3 months after starting the weight loss medications for recheck.  And in 6 months for a blood pressure recheck.   Glad you are well.   If you have any questions or concerns, please don't hesitate to send me a message via MyChart or call the office at [phone]. Thank you for visiting with [place] today! It's our pleasure caring for you.   Exercise to Lose Weight Exercise and a healthy diet may help you lose weight. Your doctor may suggest specific exercises. EXERCISE IDEAS AND TIPS Choose low-cost things you enjoy doing, such as walking, bicycling, or exercising to workout videos. Take stairs instead of the elevator. Walk during your lunch break. Park your car further away from work or school. Go to a gym or an exercise class. Start with 5 to 10 minutes of exercise each day. Build up to 30 minutes of exercise 4 to 6 days a week. Wear shoes with good support and comfortable clothes. Stretch before and after working out. Work out until you breathe harder and your heart beats faster. Drink extra water when you exercise. Do not do so much that you hurt yourself, feel dizzy, or get very short of breath. Exercises that burn about 150 calories: Running 1  miles in 15 minutes. Playing volleyball for 45 to 60 minutes. Washing and waxing a car for 45 to 60 minutes. Playing touch football for 45 minutes. Walking 1  miles in 35 minutes. Pushing a stroller 1  miles in 30 minutes. Playing basketball for 30 minutes. Raking leaves for 30 minutes. Bicycling 5 miles in 30 minutes. Walking 2 miles in 30 minutes. Dancing for 30 minutes. Shoveling snow for 15 minutes. Swimming laps for 20 minutes. Walking up stairs for 15 minutes. Bicycling 4 miles in 15 minutes. Gardening for 30 to 45 minutes. Jumping rope for 15 minutes. Washing windows or floors for 45 to 60 minutes.

## 2022-02-10 ENCOUNTER — Other Ambulatory Visit: Payer: Self-pay | Admitting: Family Medicine

## 2022-02-10 DIAGNOSIS — I10 Essential (primary) hypertension: Secondary | ICD-10-CM

## 2022-02-12 ENCOUNTER — Telehealth: Payer: Self-pay

## 2022-02-12 NOTE — Telephone Encounter (Signed)
Semaglutide,0.25 or 0.5MG /DOS, (OZEMPIC, 0.25 OR 0.5 MG/DOSE,) 2 MG/1.5ML SOPN   Patient called in a needs a PA done on her Ozempic. Patient would like a call once this has been completed.

## 2022-02-15 NOTE — Telephone Encounter (Signed)
Spoke with patient, her insurance covered the medication. Does not need a PA at this time.

## 2022-03-20 ENCOUNTER — Telehealth: Payer: Self-pay | Admitting: Family Medicine

## 2022-03-20 NOTE — Telephone Encounter (Signed)
Patient wants to talk to MA about ozempic stated she has a question about med.  ?

## 2022-03-22 ENCOUNTER — Other Ambulatory Visit: Payer: Self-pay | Admitting: Family Medicine

## 2022-03-22 NOTE — Telephone Encounter (Signed)
I spoke with the pt to address concerns. She says that she doesn't feel any side effects from the medicine at all, and is wondering if she is suppose to continue to increase. Pt was advised to increase only to 0.5 mg after injecting 0.25 mg for 28 days. She will then continue 0.5 mg until she follows up in office. She also states the costs of the medicine has increased. Pt was told that Prior Auth will be started and sent to insurance for coverage. She verbalized understanding.  ?

## 2022-03-26 ENCOUNTER — Other Ambulatory Visit: Payer: Self-pay

## 2022-03-26 ENCOUNTER — Other Ambulatory Visit: Payer: Self-pay | Admitting: Family Medicine

## 2022-03-26 MED ORDER — OZEMPIC (0.25 OR 0.5 MG/DOSE) 2 MG/1.5ML ~~LOC~~ SOPN: 0.5000 mg | PEN_INJECTOR | SUBCUTANEOUS | 3 refills | Status: DC

## 2022-03-26 MED ORDER — SEMAGLUTIDE (1 MG/DOSE) 4 MG/3ML ~~LOC~~ SOPN: 0.5000 mg | PEN_INJECTOR | SUBCUTANEOUS | 3 refills | Status: DC

## 2022-03-26 NOTE — Addendum Note (Signed)
Addended by: Billey Chang on: 03/26/2022 04:06 PM ? ? Modules accepted: Orders ? ?

## 2022-03-29 NOTE — Telephone Encounter (Signed)
Called pharmacy for clarification. 0.25/0.5 ML was picked up on 03/24/2022. Please disregard. ? ?Thanks!  ?

## 2022-03-29 NOTE — Telephone Encounter (Signed)
Pharmacy requesting changes: would like to change to OZEMPIC, 0.25 OR 0.5 MG/DOSE, 2 MG/3ML SOPN ?

## 2022-04-17 ENCOUNTER — Ambulatory Visit (INDEPENDENT_AMBULATORY_CARE_PROVIDER_SITE_OTHER): Payer: BC Managed Care – PPO | Admitting: Physician Assistant

## 2022-04-17 ENCOUNTER — Encounter: Payer: Self-pay | Admitting: Physician Assistant

## 2022-04-17 VITALS — BP 126/86 | HR 94 | Temp 98.1°F | Ht 70.0 in

## 2022-04-17 DIAGNOSIS — J029 Acute pharyngitis, unspecified: Secondary | ICD-10-CM | POA: Diagnosis not present

## 2022-04-17 MED ORDER — AMOXICILLIN 500 MG PO CAPS: 500.0000 mg | ORAL_CAPSULE | Freq: Two times a day (BID) | ORAL | 0 refills | Status: AC

## 2022-04-17 NOTE — Progress Notes (Signed)
Bailey Sheppard is a 41 y.o. female here for a sore throat. ? ?History of Present Illness:  ? ?Chief Complaint  ?Patient presents with  ? Sore Throat  ?  Pt c/o sore throat and son just tested positive for strep today, no fever,   ? ? ?Sore Throat  ?[name] presents with c/o sore throat that began today. States that usually she wouldn't come in so early, but due to her son recently getting diagnosed with strep, she believed this could be the beginning stages. At this time she would like to get tested for this and treated as soon as possible. Denies fever or chills.  ? ?She is the primary caregiver of her child. ? ? ?Past Medical History:  ?Diagnosis Date  ? Acute pericarditis, unspecified 06/11/2013  ? Anemia   ? Anxiety   ? Chronic hypertension during pregnancy 09/17/2018  ? Depression   ? Elevated hemoglobin A1c 2018  ? 5.7  ? Fibroids   ? Hypertension   ? ITP (idiopathic thrombocytopenic purpura)   ? Obesity   ? Vaginal Pap smear, abnormal   ? ?  ?Social History  ? ?Tobacco Use  ? Smoking status: Never  ? Smokeless tobacco: Never  ?Vaping Use  ? Vaping Use: Never used  ?Substance Use Topics  ? Alcohol use: No  ? Drug use: No  ? ? ?Past Surgical History:  ?Procedure Laterality Date  ? INTRAUTERINE DEVICE (IUD) INSERTION  04/2012  ? UMBILICAL HERNIA REPAIR    ? ? ?Family History  ?Problem Relation Age of Onset  ? Hypertension Mother   ? Diabetes Father   ? Kidney failure Father   ? Heart failure Father   ? Diabetes Brother   ? Lupus Cousin   ? Diabetes Maternal Grandfather   ? Kidney failure Maternal Grandfather   ? Diabetes Paternal Grandmother   ? ? ?Allergies  ?Allergen Reactions  ? Nsaids   ?  ITP  ? ? ?Current Medications:  ? ?Current Outpatient Medications:  ?  acetaminophen (TYLENOL) 500 MG tablet, Take 500 mg by mouth every 6 (six) hours as needed for headache., Disp: , Rfl:  ?  D3-50 1.25 MG (50000 UT) capsule, Take 50,000 Units by mouth once a week., Disp: , Rfl:  ?  diltiazem (CARDIZEM CD) 240 MG 24 hr  capsule, TAKE 1 CAPSULE BY MOUTH EVERY DAY, Disp: 90 capsule, Rfl: 3 ?  fluticasone (FLONASE) 50 MCG/ACT nasal spray, Place 1 spray into both nostrils daily., Disp: 16 g, Rfl: 6 ?  hydrochlorothiazide (HYDRODIURIL) 25 MG tablet, Take 1 tablet (25 mg total) by mouth daily., Disp: 90 tablet, Rfl: 3 ?  levonorgestrel (MIRENA) 20 MCG/24HR IUD, 1 each by Intrauterine route once., Disp: , Rfl:  ?  Semaglutide, 1 MG/DOSE, 4 MG/3ML SOPN, Inject 0.5 mg as directed once a week., Disp: 3 mL, Rfl: 3 ?  Semaglutide,0.25 or 0.'5MG'$ /DOS, (OZEMPIC, 0.25 OR 0.5 MG/DOSE,) 2 MG/1.5ML SOPN, Inject 0.5 mg into the skin once a week., Disp: 3 mL, Rfl: 3  ? ?Review of Systems:  ? ?ROS ?Negative unless otherwise specified per HPI. ?Vitals:  ? ?Vitals:  ? 04/17/22 1546  ?BP: 126/86  ?Pulse: 94  ?Temp: 98.1 ?F (36.7 ?C)  ?TempSrc: Temporal  ?SpO2: 98%  ?Height: '5\' 10"'$  (1.778 m)  ?   ?Body mass index is 42.24 kg/m?. ? ?Physical Exam:  ? ?Physical Exam ?Vitals and nursing note reviewed.  ?Constitutional:   ?   General: She is not in acute distress. ?  Appearance: She is well-developed. She is not ill-appearing or toxic-appearing.  ?HENT:  ?   Head: Normocephalic and atraumatic.  ?   Right Ear: Tympanic membrane, ear canal and external ear normal. Tympanic membrane is not erythematous, retracted or bulging.  ?   Left Ear: Tympanic membrane, ear canal and external ear normal. Tympanic membrane is not erythematous, retracted or bulging.  ?   Nose: Nose normal.  ?   Right Sinus: No maxillary sinus tenderness or frontal sinus tenderness.  ?   Left Sinus: No maxillary sinus tenderness or frontal sinus tenderness.  ?   Mouth/Throat:  ?   Lips: Pink.  ?   Mouth: Mucous membranes are moist.  ?   Pharynx: Uvula midline. Posterior oropharyngeal erythema present.  ?   Tonsils: Tonsillar exudate present. 1+ on the right. 1+ on the left.  ?Eyes:  ?   General: Lids are normal.  ?   Conjunctiva/sclera: Conjunctivae normal.  ?Neck:  ?   Trachea: Trachea normal.   ?Cardiovascular:  ?   Rate and Rhythm: Normal rate and regular rhythm.  ?   Pulses: Normal pulses.  ?   Heart sounds: Normal heart sounds, S1 normal and S2 normal.  ?Pulmonary:  ?   Effort: Pulmonary effort is normal.  ?   Breath sounds: Normal breath sounds. No decreased breath sounds, wheezing, rhonchi or rales.  ?Lymphadenopathy:  ?   Cervical: No cervical adenopathy.  ?Skin: ?   General: Skin is warm and dry.  ?Neurological:  ?   Mental Status: She is alert.  ?   GCS: GCS eye subscore is 4. GCS verbal subscore is 5. GCS motor subscore is 6.  ?Psychiatric:     ?   Speech: Speech normal.     ?   Behavior: Behavior normal. Behavior is cooperative.  ? ? ?Assessment and Plan:  ? ?Sore Throat  ?No red flags-- Suspect strep infection (our office is currently out of strep infection) ?Start amoxicillin 500 mg twice daily x 10 days ?Encouraged patient to push more fluids and get rest  ?Follow up if new/worsening symptoms or concerns occur  ?Work note provided ? ?I,Havlyn C Ratchford,acting as a scribe for [employer], PA.,have documented all relevant documentation on the behalf of Inda Coke, PA,as directed by  Inda Coke, PA while in the presence of Inda Coke, [place]. ? ?IInda Coke, PA, have reviewed all documentation for this visit. The documentation on 04/17/22 for the exam, diagnosis, procedures, and orders are all accurate and complete. ? ? ?Inda Coke, PA-C ? ?

## 2022-04-17 NOTE — Patient Instructions (Signed)
It was great to see you! ? ?Start antibiotic for strep -- this has been sent in. ? ?Keep [place] posted if you need anything! ? ?Take care, ? ?Inda Coke PA-C  ?

## 2022-04-26 ENCOUNTER — Telehealth: Payer: Self-pay | Admitting: Family Medicine

## 2022-04-26 NOTE — Telephone Encounter (Signed)
Pharmacy filled initial .'25mg'$  for 28 days.  ?Then refilled .'5mg'$  for the next month. ?Then refilled of .25 mg (03/27) ?Pt is now out of medication, because she continued the .'5mg'$  dosage throughout. ? ?Next refill is allowed 05/09 ? ?Please call to confirm amount with pt and that Pharmacy did not make an error.  ? ?Semaglutide,0.25 or 0.'5MG'$ /DOS, (OZEMPIC, 0.25 OR 0.5 MG/DOSE,) 2 MG/1.5ML SOPN [[id number]]  ? ? ?

## 2022-04-26 NOTE — Telephone Encounter (Signed)
Spoke with pt and she stated that she spoke with the pharmacy and they are going to reissue the correct dosage amt.  ?  ? ?

## 2022-05-09 ENCOUNTER — Ambulatory Visit (INDEPENDENT_AMBULATORY_CARE_PROVIDER_SITE_OTHER): Payer: BC Managed Care – PPO | Admitting: Family Medicine

## 2022-05-09 ENCOUNTER — Encounter: Payer: Self-pay | Admitting: Family Medicine

## 2022-05-09 VITALS — BP 122/78 | HR 107 | Temp 98.0°F | Ht 70.0 in | Wt 292.0 lb

## 2022-05-09 DIAGNOSIS — Z6841 Body Mass Index (BMI) 40.0 and over, adult: Secondary | ICD-10-CM

## 2022-05-09 DIAGNOSIS — R7303 Prediabetes: Secondary | ICD-10-CM

## 2022-05-09 MED ORDER — SEMAGLUTIDE (2 MG/DOSE) 8 MG/3ML ~~LOC~~ SOPN: 2.0000 mg | PEN_INJECTOR | SUBCUTANEOUS | 2 refills | Status: DC

## 2022-05-09 MED ORDER — SEMAGLUTIDE (1 MG/DOSE) 4 MG/3ML ~~LOC~~ SOPN: 1.0000 mg | PEN_INJECTOR | SUBCUTANEOUS | 0 refills | Status: DC

## 2022-05-09 NOTE — Patient Instructions (Signed)
Please return in 3-4 months to recheck weight.  ? ?If you have any questions or concerns, please don't hesitate to send me a message via MyChart or call the office at [phone]. Thank you for visiting with [place] today! It's our pleasure caring for you.  ?

## 2022-05-09 NOTE — Progress Notes (Signed)
? ?Subjective  ?CC:  ?Chief Complaint  ?Patient presents with  ? Weight Check  ?  Pt here to F/U with weight  ? ? ?HPI: Bailey Sheppard is a 41 y.o. female who presents to the office today to address the problems listed above in the chief complaint. ?On ozempic x 2 months. On 0.'5mg'$  weekly. Tolerating well w/o AE. Eating healthy diet. Wants to start weight training.  ?  ? ?Wt Readings from Last 3 Encounters:  ?05/09/22 292 lb (132.5 kg)  ?02/09/22 294 lb 6.4 oz (133.5 kg)  ?11/01/21 292 lb 3.2 oz (132.5 kg)  ? ? ?Assessment  ?1. Morbid obesity with BMI of 40.0-44.9, adult ([place])   ?2. Prediabetes   ? ?  ?Plan  ?obesity:  titrate dose up of ozempic to '1mg'$  weekly x 4 weeks then '2mg'$  weekly. Continue diet and exercise.  ?Prediabetes: should be improved. Will check next visit.  ? ?Follow up: 3-4 mo for recheck weight  ?08/09/2022 ? ?No orders of the defined types were placed in this encounter. ? ?No orders of the defined types were placed in this encounter. ? ?  ? ?I reviewed the patients updated PMH, FH, and SocHx.  ?  ?Patient Active Problem List  ? Diagnosis Date Noted  ? Idiopathic thrombocytopenic purpura (ITP) ([place]) 05/09/2020  ?  Priority: High  ? Prediabetes 02/25/2020  ?  Priority: High  ? Migraine with aura and without status migrainosus, not intractable 02/23/2020  ?  Priority: High  ? Morbid obesity with BMI of 40.0-44.9, adult ([place]) 12/03/2017  ?  Priority: High  ? Essential hypertension 12/20/2016  ?  Priority: High  ? Idiopathic thrombocytopenic purpura ([place]) 02/20/2012  ?  Priority: High  ? Pure hypercholesterolemia 03/08/2019  ?  Priority: Medium   ? IUD (intrauterine device) in place 08/01/2013  ?  Priority: Medium   ? Seasonal allergies 11/08/2020  ?  Priority: Low  ? Fibroid 05/13/2012  ?  Priority: Low  ? ?Current Meds  ?Medication Sig  ? acetaminophen (TYLENOL) 500 MG tablet Take 500 mg by mouth every 6 (six) hours as needed for headache.  ? D3-50 1.25 MG (50000 UT) capsule Take 50,000 Units by mouth  once a week.  ? diltiazem (CARDIZEM CD) 240 MG 24 hr capsule TAKE 1 CAPSULE BY MOUTH EVERY DAY  ? fluticasone (FLONASE) 50 MCG/ACT nasal spray Place 1 spray into both nostrils daily.  ? hydrochlorothiazide (HYDRODIURIL) 25 MG tablet Take 1 tablet (25 mg total) by mouth daily.  ? levonorgestrel (MIRENA) 20 MCG/24HR IUD 1 each by Intrauterine route once.  ? Semaglutide, 1 MG/DOSE, 4 MG/3ML SOPN Inject 0.5 mg as directed once a week.  ? Semaglutide,0.25 or 0.'5MG'$ /DOS, (OZEMPIC, 0.25 OR 0.5 MG/DOSE,) 2 MG/1.5ML SOPN Inject 0.5 mg into the skin once a week.  ? ? ?Allergies: ?Patient is allergic to nsaids. ?Family History: ?Patient family history includes Diabetes in her brother, father, maternal grandfather, and paternal grandmother; Heart failure in her father; Hypertension in her mother; Kidney failure in her father and maternal grandfather; Lupus in her cousin. ?Social History:  ?Patient  reports that she has never smoked. She has never used smokeless tobacco. She reports that she does not drink alcohol and does not use drugs. ? ?Review of Systems: ?Constitutional: Negative for fever malaise or anorexia ?Cardiovascular: negative for chest pain ?Respiratory: negative for SOB or persistent cough ?Gastrointestinal: negative for abdominal pain ? ?Objective  ?Vitals: BP 122/78   Pulse (!) 107   Temp 98 ?  F (36.7 ?C)   Ht '5\' 10"'$  (1.778 m)   Wt 292 lb (132.5 kg)   SpO2 97%   BMI 41.90 kg/m?  ?General: no acute distress , A&Ox3 ? ? ?Commons side effects, risks, benefits, and alternatives for medications and treatment plan prescribed today were discussed, and the patient expressed understanding of the given instructions. Patient is instructed to call or message via MyChart if he/she has any questions or concerns regarding our treatment plan. No barriers to understanding were identified. We discussed Red Flag symptoms and signs in detail. Patient expressed understanding regarding what to do in case of urgent or emergency  type symptoms.  ?Medication list was reconciled, printed and provided to the patient in AVS. Patient instructions and summary information was reviewed with the patient as documented in the AVS. ?This note was prepared with assistance of Systems analyst. Occasional wrong-word or sound-a-like substitutions may have occurred due to the inherent limitations of voice recognition software ? ?This visit occurred during the SARS-CoV-2 public health emergency.  Safety protocols were in place, including screening questions prior to the visit, additional usage of staff PPE, and extensive cleaning of exam room while observing appropriate contact time as indicated for disinfecting solutions.  ? ?

## 2022-05-14 ENCOUNTER — Ambulatory Visit (INDEPENDENT_AMBULATORY_CARE_PROVIDER_SITE_OTHER): Payer: BC Managed Care – PPO | Admitting: Physician Assistant

## 2022-05-14 ENCOUNTER — Telehealth: Payer: Self-pay | Admitting: Family Medicine

## 2022-05-14 ENCOUNTER — Encounter: Payer: Self-pay | Admitting: Physician Assistant

## 2022-05-14 VITALS — BP 110/74 | HR 94 | Temp 98.3°F | Ht 70.0 in | Wt 283.0 lb

## 2022-05-14 DIAGNOSIS — R1011 Right upper quadrant pain: Secondary | ICD-10-CM | POA: Diagnosis not present

## 2022-05-14 DIAGNOSIS — R1110 Vomiting, unspecified: Secondary | ICD-10-CM

## 2022-05-14 LAB — CBC WITH DIFFERENTIAL/PLATELET
Basophils Absolute: 0 10*3/uL (ref 0.0–0.1)
Basophils Relative: 0.2 % (ref 0.0–3.0)
Eosinophils Absolute: 0.3 10*3/uL (ref 0.0–0.7)
Eosinophils Relative: 2.8 % (ref 0.0–5.0)
HCT: 39.2 % (ref 36.0–46.0)
Hemoglobin: 13.1 g/dL (ref 12.0–15.0)
Lymphocytes Relative: 13.7 % (ref 12.0–46.0)
Lymphs Abs: 1.6 10*3/uL (ref 0.7–4.0)
MCHC: 33.5 g/dL (ref 30.0–36.0)
MCV: 82.4 fl (ref 78.0–100.0)
Monocytes Absolute: 0.9 10*3/uL (ref 0.1–1.0)
Monocytes Relative: 8.3 % (ref 3.0–12.0)
Neutro Abs: 8.6 10*3/uL — ABNORMAL HIGH (ref 1.4–7.7)
Neutrophils Relative %: 75 % (ref 43.0–77.0)
Platelets: 284 10*3/uL (ref 150.0–400.0)
RBC: 4.76 Mil/uL (ref 3.87–5.11)
RDW: 15.3 % (ref 11.5–15.5)
WBC: 11.5 10*3/uL — ABNORMAL HIGH (ref 4.0–10.5)

## 2022-05-14 NOTE — Patient Instructions (Signed)
It was great to see you! ? ?We are going to get you scheduled for an abdominal ultrasound to evaluate your pain.  ? ?I will be in touch with lab results. ? ?I do recommend holding your Ozempic until your symptoms resolve. ? ?If worsening symptoms, please call the office. ? ?Take care, ? ?Inda Coke PA-C  ?

## 2022-05-14 NOTE — Telephone Encounter (Signed)
Pt states she thought a Rx was going to be written to extend the current Rx of antibiotics.  ?FO CSR could not find notation of this in the AVS. ? ?Pt states she has two days left of the Rx. ? ?If additional doses will be written for continuing the antibiotic, [facility] [address] [facility] ?

## 2022-05-14 NOTE — Telephone Encounter (Signed)
Please see message and advise 

## 2022-05-14 NOTE — Progress Notes (Signed)
Bailey Sheppard is a 41 y.o. female here for a follow up of a pre-existing problem. ? ?History of Present Illness:  ? ?Chief Complaint  ?Patient presents with  ? Fever  ?  Pt c/o fever Friday and Saturday did not take Temp. Also having body aches and fatigue. Had vomiting Friday and Saturday none since. Pt had antibiotic  Amoxicillin that she was given on 4/18 but only took for 6 days for possible Strep from her son. Pt restarted abx on Saturday and is feeling somewhat better. No more fevers that she is aware of.  ? ? ?HPI ?  ?On Friday, she started having malaise, fatigue, subjective fevers, body aches. She then started vomiting a few times. She denies any blood in emesis. She had some leftover amoxicillin from her strep infection last month and has taken about 4 capsules. This has caused her diarrhea, per her report. ? ?She did take her ozempic 2 mg weekly on Friday but she took this after she started vomiting, and does not feel like this was the culprit.  Denies sore throat, increased bruising/bleeding. Had Covid test at home which was negative. ? ?Symptoms started recently and are located on right upper quadrant. No vaginal discharge or pelvic pain. She had some R-sided chest pain that radiated to her shoulder but this is gone. She notes that this was related laying down and coughing but has resolved. Denies: left sided chest pain or any chest pain with exertion, SOB. ? ?Past Medical History:  ?Diagnosis Date  ? Acute pericarditis, unspecified 06/11/2013  ? Anemia   ? Anxiety   ? Chronic hypertension during pregnancy 09/17/2018  ? Depression   ? Elevated hemoglobin A1c 2018  ? 5.7  ? Fibroids   ? Hypertension   ? ITP (idiopathic thrombocytopenic purpura)   ? Obesity   ? Vaginal Pap smear, abnormal   ? ?  ?Social History  ? ?Tobacco Use  ? Smoking status: Never  ? Smokeless tobacco: Never  ?Vaping Use  ? Vaping Use: Never used  ?Substance Use Topics  ? Alcohol use: No  ? Drug use: No  ? ? ?Past Surgical History:   ?Procedure Laterality Date  ? INTRAUTERINE DEVICE (IUD) INSERTION  04/2012  ? UMBILICAL HERNIA REPAIR    ? ? ?Family History  ?Problem Relation Age of Onset  ? Hypertension Mother   ? Diabetes Father   ? Kidney failure Father   ? Heart failure Father   ? Diabetes Brother   ? Lupus Cousin   ? Diabetes Maternal Grandfather   ? Kidney failure Maternal Grandfather   ? Diabetes Paternal Grandmother   ? ? ?Allergies  ?Allergen Reactions  ? Nsaids   ?  ITP  ? ? ?Current Medications:  ? ?Current Outpatient Medications:  ?  acetaminophen (TYLENOL) 500 MG tablet, Take 500 mg by mouth every 6 (six) hours as needed for headache., Disp: , Rfl:  ?  diltiazem (CARDIZEM CD) 240 MG 24 hr capsule, TAKE 1 CAPSULE BY MOUTH EVERY DAY, Disp: 90 capsule, Rfl: 3 ?  fluticasone (FLONASE) 50 MCG/ACT nasal spray, Place 1 spray into both nostrils daily., Disp: 16 g, Rfl: 6 ?  hydrochlorothiazide (HYDRODIURIL) 25 MG tablet, Take 1 tablet (25 mg total) by mouth daily., Disp: 90 tablet, Rfl: 3 ?  levonorgestrel (MIRENA) 20 MCG/24HR IUD, 1 each by Intrauterine route once., Disp: , Rfl:  ?  Semaglutide, 1 MG/DOSE, 4 MG/3ML SOPN, Inject 1 mg as directed once a week., Disp: 3 mL,  Rfl: 0 ?  Semaglutide, 2 MG/DOSE, 8 MG/3ML SOPN, Inject 2 mg as directed once a week. (Patient not taking: Reported on 05/14/2022), Disp: 3 mL, Rfl: 2  ? ?Review of Systems:  ? ?ROS ?Negative unless otherwise specified per HPI.  ? ?Vitals:  ? ?Vitals:  ? 05/14/22 1450  ?BP: 110/74  ?Pulse: 94  ?Temp: 98.3 ?F (36.8 ?C)  ?TempSrc: Temporal  ?SpO2: 97%  ?Weight: 283 lb (128.4 kg)  ?Height: '5\' 10"'$  (1.778 m)  ?   ?Body mass index is 40.61 kg/m?. ? ?Physical Exam:  ? ?Physical Exam ?Vitals and nursing note reviewed.  ?Constitutional:   ?   General: She is not in acute distress. ?   Appearance: She is well-developed. She is not ill-appearing or toxic-appearing.  ?Cardiovascular:  ?   Rate and Rhythm: Normal rate and regular rhythm.  ?   Pulses: Normal pulses.  ?   Heart sounds:  Normal heart sounds, S1 normal and S2 normal.  ?Pulmonary:  ?   Effort: Pulmonary effort is normal.  ?   Breath sounds: Normal breath sounds.  ?Abdominal:  ?   General: Abdomen is flat. Bowel sounds are normal.  ?   Palpations: Abdomen is soft.  ?   Tenderness: There is abdominal tenderness in the right upper quadrant. There is no right CVA tenderness, left CVA tenderness, guarding or rebound.  ?   Comments: Point tenderness in RUQ  ?Skin: ?   General: Skin is warm and dry.  ?Neurological:  ?   Mental Status: She is alert.  ?   GCS: GCS eye subscore is 4. GCS verbal subscore is 5. GCS motor subscore is 6.  ?Psychiatric:     ?   Speech: Speech normal.     ?   Behavior: Behavior normal. Behavior is cooperative.  ? ? ?Assessment and Plan:  ? ?Right upper quadrant abdominal pain; Vomiting, unspecified vomiting type, unspecified whether nausea present ?No evidence of acute abdomen requiring urgent ER evaluation or stat labs at this time ?Due to point tenderness in RUQ and recent symptoms, will obtain RUQ u/s and blood work for evaluation ?Ddx includes, but is not limited to: medication side effect (ozempic), viral illness, gallbladder issue, IBS, pancreatitis, among several others ?Recommend holding ozempic at this time ?If worsening symptoms, or inability to keep fluids down, recommend UC or ER evaluation ? ?I,Savera Zaman,acting as a Education administrator for [employer], PA.,have documented all relevant documentation on the behalf of Inda Coke, PA,as directed by  Inda Coke, PA while in the presence of Inda Coke, [place].  ? ?IInda Coke, PA, have reviewed all documentation for this visit. The documentation on 05/14/22 for the exam, diagnosis, procedures, and orders are all accurate and complete. ? ? ?Inda Coke, PA-C ? ?

## 2022-05-15 ENCOUNTER — Ambulatory Visit (HOSPITAL_BASED_OUTPATIENT_CLINIC_OR_DEPARTMENT_OTHER)
Admission: RE | Admit: 2022-05-15 | Discharge: 2022-05-15 | Disposition: A | Discharge status: Home / Self Care | Payer: BC Managed Care – PPO | Source: Ambulatory Visit | Attending: Physician Assistant | Admitting: Physician Assistant

## 2022-05-15 ENCOUNTER — Other Ambulatory Visit: Payer: Self-pay | Admitting: Physician Assistant

## 2022-05-15 DIAGNOSIS — E876 Hypokalemia: Secondary | ICD-10-CM

## 2022-05-15 DIAGNOSIS — R1011 Right upper quadrant pain: Secondary | ICD-10-CM | POA: Diagnosis present

## 2022-05-15 DIAGNOSIS — D72829 Elevated white blood cell count, unspecified: Secondary | ICD-10-CM

## 2022-05-15 LAB — COMPREHENSIVE METABOLIC PANEL
ALT: 14 U/L (ref 0–35)
AST: 11 U/L (ref 0–37)
Albumin: 4.5 g/dL (ref 3.5–5.2)
Alkaline Phosphatase: 60 U/L (ref 39–117)
BUN: 15 mg/dL (ref 6–23)
CO2: 24 mEq/L (ref 19–32)
Calcium: 10.1 mg/dL (ref 8.4–10.5)
Chloride: 99 mEq/L (ref 96–112)
Creatinine, Ser: 0.85 mg/dL (ref 0.40–1.20)
GFR: 86.05 mL/min (ref >60.00)
Glucose, Bld: 117 mg/dL — ABNORMAL HIGH (ref 70–99)
Potassium: 2.9 mEq/L — ABNORMAL LOW (ref 3.5–5.1)
Sodium: 139 mEq/L (ref 135–145)
Total Bilirubin: 0.8 mg/dL (ref 0.2–1.2)
Total Protein: 7.4 g/dL (ref 6.0–8.3)

## 2022-05-15 LAB — LIPASE: Lipase: 16 U/L (ref 11.0–59.0)

## 2022-05-15 MED ORDER — AMOXICILLIN 875 MG PO TABS: 875.0000 mg | ORAL_TABLET | Freq: Two times a day (BID) | ORAL | 0 refills | Status: AC

## 2022-05-15 MED ORDER — POTASSIUM CHLORIDE CRYS ER 10 MEQ PO TBCR: 10.0000 meq | EXTENDED_RELEASE_TABLET | Freq: Two times a day (BID) | ORAL | 0 refills | Status: DC

## 2022-05-16 ENCOUNTER — Telehealth: Payer: Self-pay | Admitting: Family Medicine

## 2022-05-16 NOTE — Telephone Encounter (Signed)
Pt states received information that PA for Ozempic needs questions answered. ? ?[place] ?Doctors only number: [phone] ? ?Ozempic at the '1MG'$  dosage ? ?Ph[facility]acy: [id number] [address] ?[address] [id number] ?Phone:  [phone] Fax:  [phone]. ?

## 2022-05-17 NOTE — Telephone Encounter (Addendum)
Spoke with pt to let her know that Ozempic was denied by insurance carrier and she spoke with insurance and they will cover Trulicity. Wanted to see if you were ok with trying her on Trulicity.

## 2022-05-21 NOTE — Telephone Encounter (Signed)
Pt called back. She is wanting to know if Trulicity can be prescribed for her, in place of Ozempic. Please advise

## 2022-05-22 NOTE — Telephone Encounter (Signed)
Spoke with pt to have her contact her insurance carrier to see if in fact they would cover Trulicity for wt loss w/o a Dx of diabetes. Pt stated that she will cb with clarification.

## 2022-05-23 MED ORDER — TRULICITY 0.75 MG/0.5ML ~~LOC~~ SOAJ: 0.7500 mg | SUBCUTANEOUS | 1 refills | Status: DC

## 2022-05-23 NOTE — Addendum Note (Signed)
Addended by: Billey Chang on: 05/23/2022 03:56 PM   Modules accepted: Orders

## 2022-05-23 NOTE — Telephone Encounter (Signed)
Patient called back- insurance does cover trulicity- No PA is needed per patient and insurance carrier.

## 2022-07-15 ENCOUNTER — Other Ambulatory Visit: Payer: Self-pay | Admitting: Family Medicine

## 2022-08-09 ENCOUNTER — Ambulatory Visit (INDEPENDENT_AMBULATORY_CARE_PROVIDER_SITE_OTHER): Payer: BC Managed Care – PPO | Admitting: Family Medicine

## 2022-08-09 ENCOUNTER — Encounter: Payer: Self-pay | Admitting: Family Medicine

## 2022-08-09 VITALS — BP 122/94 | HR 68 | Temp 98.2°F | Ht 70.0 in | Wt 295.8 lb

## 2022-08-09 DIAGNOSIS — I10 Essential (primary) hypertension: Secondary | ICD-10-CM

## 2022-08-09 DIAGNOSIS — D693 Immune thrombocytopenic purpura: Secondary | ICD-10-CM

## 2022-08-09 DIAGNOSIS — E876 Hypokalemia: Secondary | ICD-10-CM | POA: Diagnosis not present

## 2022-08-09 DIAGNOSIS — R7303 Prediabetes: Secondary | ICD-10-CM

## 2022-08-09 DIAGNOSIS — Z6841 Body Mass Index (BMI) 40.0 and over, adult: Secondary | ICD-10-CM

## 2022-08-09 LAB — CBC WITH DIFFERENTIAL/PLATELET
Basophils Absolute: 0 10*3/uL (ref 0.0–0.1)
Basophils Relative: 0.4 % (ref 0.0–3.0)
Eosinophils Absolute: 0.3 10*3/uL (ref 0.0–0.7)
Eosinophils Relative: 4.8 % (ref 0.0–5.0)
HCT: 37.4 % (ref 36.0–46.0)
Hemoglobin: 12.4 g/dL (ref 12.0–15.0)
Lymphocytes Relative: 26.2 % (ref 12.0–46.0)
Lymphs Abs: 1.4 10*3/uL (ref 0.7–4.0)
MCHC: 33.2 g/dL (ref 30.0–36.0)
MCV: 85.4 fl (ref 78.0–100.0)
Monocytes Absolute: 0.5 10*3/uL (ref 0.1–1.0)
Monocytes Relative: 10 % (ref 3.0–12.0)
Neutro Abs: 3.1 10*3/uL (ref 1.4–7.7)
Neutrophils Relative %: 58.6 % (ref 43.0–77.0)
Platelets: 220 10*3/uL (ref 150.0–400.0)
RBC: 4.38 Mil/uL (ref 3.87–5.11)
RDW: 15.7 % — ABNORMAL HIGH (ref 11.5–15.5)
WBC: 5.4 10*3/uL (ref 4.0–10.5)

## 2022-08-09 LAB — BASIC METABOLIC PANEL
BUN: 10 mg/dL (ref 6–23)
CO2: 28 mEq/L (ref 19–32)
Calcium: 8.6 mg/dL (ref 8.4–10.5)
Chloride: 104 mEq/L (ref 96–112)
Creatinine, Ser: 0.77 mg/dL (ref 0.40–1.20)
GFR: 96.73 mL/min (ref >60.00)
Glucose, Bld: 80 mg/dL (ref 70–99)
Potassium: 3.9 mEq/L (ref 3.5–5.1)
Sodium: 139 mEq/L (ref 135–145)

## 2022-08-09 LAB — HEMOGLOBIN A1C: Hgb A1c MFr Bld: 5.6 % (ref 4.6–6.5)

## 2022-08-09 MED ORDER — TRULICITY 3 MG/0.5ML ~~LOC~~ SOAJ: 3.0000 mg | SUBCUTANEOUS | 1 refills | Status: DC

## 2022-08-09 MED ORDER — TRULICITY 1.5 MG/0.5ML ~~LOC~~ SOAJ: 1.5000 mg | SUBCUTANEOUS | 0 refills | Status: DC

## 2022-08-09 NOTE — Progress Notes (Signed)
Subjective  CC:  Chief Complaint  Patient presents with   Obesity    HPI: Bailey Sheppard is a 41 y.o. female who presents to the office today to address the problems listed above in the chief complaint. Follow-up obesity.  Patient is now on Trulicity due to insurance requirements.  Taking 0.75 mg weekly.  Unfortunately she does not feel like it is helping her with appetite suppression or diet management at all.  She physically feels like her body is changing but she has not lost any weight.  She has no adverse effects.  Her diet is fairly healthy although she does not calorie count.  She has not changed her diet since being on these medications.  She does not exercise regularly but would like to start walking Hypertension: She has missed her medications for the last few days.  Has been well-controlled. GI illness back in May: Reviewed office visit notes, lab work and ultrasound.  Symptoms did resolve.  However potassium was low.  It was not rechecked.  She did take supplements.  She is on hydrochlorothiazide for hypertension. History of prediabetes.  No symptoms of hypoglycemia. ITP: Would like platelets recheck.  No symptoms  Wt Readings from Last 3 Encounters:  08/09/22 295 lb 12.8 oz (134.2 kg)  05/14/22 283 lb (128.4 kg)  05/09/22 292 lb (132.5 kg)      Assessment  1. Morbid obesity with BMI of 40.0-44.9, adult ([place])   2. Prediabetes   3. Hypokalemia   4. Idiopathic thrombocytopenic purpura (ITP) ([facility])   5. Essential hypertension      Plan  Morbid obesity: Counseling medication given.  Will titrate Trulicity dose up.  Recommend calorie counting and increasing exercise.  Recheck 3 months Prediabetes: Recheck A1c Hypokalemia: Due to diarrhea and HCTZ.  Recheck after supplementation. Hypertension: Uncontrolled today, in part due to noncompliance of medications.  Patient will restart her medications and follow-up with visit in 3 months ITP: Check platelets  Follow up: 3  months recheck blood pressure and weight Visit date not found  Orders Placed This Encounter  Procedures   Basic metabolic panel   Hemoglobin A1c   CBC with Differential/Platelet   Meds ordered this encounter  Medications   Dulaglutide (TRULICITY) 1.5 OA/4.1YS SOPN    Sig: Inject 1.5 mg into the skin once a week.    Dispense:  2 mL    Refill:  0   Dulaglutide (TRULICITY) 3 AY/3.0ZS SOPN    Sig: Inject 3 mg as directed once a week.    Dispense:  6 mL    Refill:  1      I reviewed the patients updated PMH, FH, and SocHx.    Patient Active Problem List   Diagnosis Date Noted   Idiopathic thrombocytopenic purpura (ITP) ([place]) 05/09/2020    Priority: High   Prediabetes 02/25/2020    Priority: High   Migraine with aura and without status migrainosus, not intractable 02/23/2020    Priority: High   Morbid obesity with BMI of 40.0-44.9, adult ([place]) 12/03/2017    Priority: High   Essential hypertension 12/20/2016    Priority: High   Idiopathic thrombocytopenic purpura ([place]) 02/20/2012    Priority: High   Pure hypercholesterolemia 03/08/2019    Priority: Medium    IUD (intrauterine device) in place 08/01/2013    Priority: Medium    Seasonal allergies 11/08/2020    Priority: Low   Fibroid 05/13/2012    Priority: Low   Current Meds  Medication Sig  acetaminophen (TYLENOL) 500 MG tablet Take 500 mg by mouth every 6 (six) hours as needed for headache.   diltiazem (CARDIZEM CD) 240 MG 24 hr capsule TAKE 1 CAPSULE BY MOUTH EVERY DAY   Dulaglutide (TRULICITY) 1.5 JW/1.1BJ SOPN Inject 1.5 mg into the skin once a week.   Dulaglutide (TRULICITY) 3 YN/8.2NF SOPN Inject 3 mg as directed once a week.   fluticasone (FLONASE) 50 MCG/ACT nasal spray Place 1 spray into both nostrils daily.   hydrochlorothiazide (HYDRODIURIL) 25 MG tablet Take 1 tablet (25 mg total) by mouth daily.   levonorgestrel (MIRENA) 20 MCG/24HR IUD 1 each by Intrauterine route once.   [DISCONTINUED] TRULICITY 6.21  HY/8.6VH SOPN INJECT 0.75 MG SUBCUTANEOUSLY ONE TIME PER WEEK    Allergies: Patient is allergic to nsaids. Family History: Patient family history includes Diabetes in her brother, father, maternal grandfather, and paternal grandmother; Heart failure in her father; Hypertension in her mother; Kidney failure in her father and maternal grandfather; Lupus in her cousin. Social History:  Patient  reports that she has never smoked. She has never used smokeless tobacco. She reports that she does not drink alcohol and does not use drugs.  Review of Systems: Constitutional: Negative for fever malaise or anorexia Cardiovascular: negative for chest pain Respiratory: negative for SOB or persistent cough Gastrointestinal: negative for abdominal pain  Objective  Vitals: BP (!) 122/94   Pulse 68   Temp 98.2 F (36.8 C)   Ht '5\' 10"'$  (1.778 m)   Wt 295 lb 12.8 oz (134.2 kg)   SpO2 98%   BMI 42.44 kg/m  General: no acute distress , A&Ox3 HEENT: PEERL, conjunctiva normal, neck is supple Cardiovascular:  RRR without murmur or gallop.  Respiratory:  Good breath sounds bilaterally, CTAB with normal respiratory effort Skin:  Warm, no rashes    Commons side effects, risks, benefits, and alternatives for medications and treatment plan prescribed today were discussed, and the patient expressed understanding of the given instructions. Patient is instructed to call or message via MyChart if he/she has any questions or concerns regarding our treatment plan. No barriers to understanding were identified. We discussed Red Flag symptoms and signs in detail. Patient expressed understanding regarding what to do in case of urgent or emergency type symptoms.  Medication list was reconciled, printed and provided to the patient in AVS. Patient instructions and summary information was reviewed with the patient as documented in the AVS. This note was prepared with assistance of Dragon voice recognition software.  Occasional wrong-word or sound-a-like substitutions may have occurred due to the inherent limitations of voice recognition software  This visit occurred during the SARS-CoV-2 public health emergency.  Safety protocols were in place, including screening questions prior to the visit, additional usage of staff PPE, and extensive cleaning of exam room while observing appropriate contact time as indicated for disinfecting solutions.

## 2022-08-09 NOTE — Patient Instructions (Signed)
Please return in 3 months to recheck weight.   Increase trulicity to 1.'5mg'$  weekly for one month, then increase to '3mg'$  weekly. Let me know if you have any problems.   I will release your lab results to you on your MyChart account with further instructions. You may see the results before I do, but when I review them I will send you a message with my report or have my assistant call you if things need to be discussed. Please reply to my message with any questions. Thank you!   If you have any questions or concerns, please don't hesitate to send me a message via MyChart or call the office at [phone]. Thank you for visiting with [place] today! It's our pleasure caring for you.

## 2022-09-24 ENCOUNTER — Encounter: Payer: Self-pay | Admitting: *Deleted

## 2022-11-09 ENCOUNTER — Ambulatory Visit (INDEPENDENT_AMBULATORY_CARE_PROVIDER_SITE_OTHER): Payer: BC Managed Care – PPO | Admitting: Family Medicine

## 2022-11-09 ENCOUNTER — Encounter: Payer: Self-pay | Admitting: Family Medicine

## 2022-11-09 VITALS — BP 120/82 | HR 78 | Temp 97.4°F | Ht 70.0 in | Wt 281.2 lb

## 2022-11-09 DIAGNOSIS — D693 Immune thrombocytopenic purpura: Secondary | ICD-10-CM | POA: Diagnosis not present

## 2022-11-09 DIAGNOSIS — M25562 Pain in left knee: Secondary | ICD-10-CM

## 2022-11-09 DIAGNOSIS — Z6841 Body Mass Index (BMI) 40.0 and over, adult: Secondary | ICD-10-CM

## 2022-11-09 DIAGNOSIS — Z23 Encounter for immunization: Secondary | ICD-10-CM

## 2022-11-09 DIAGNOSIS — G8929 Other chronic pain: Secondary | ICD-10-CM | POA: Diagnosis not present

## 2022-11-09 DIAGNOSIS — M25462 Effusion, left knee: Secondary | ICD-10-CM

## 2022-11-09 DIAGNOSIS — I10 Essential (primary) hypertension: Secondary | ICD-10-CM

## 2022-11-09 LAB — SEDIMENTATION RATE: Sed Rate: 19 mm/hr (ref 0–20)

## 2022-11-09 NOTE — Progress Notes (Signed)
Subjective  CC:  Chief Complaint  Patient presents with   Follow-up    Weight management    Joint Swelling    Knee left     HPI: Bailey Sheppard is a 41 y.o. female who presents to the office today to address the problems listed above in the chief complaint. Hypertension f/u: Control is  improved . Pt reports she is doing well. taking medications as instructed, no medication side effects noted, no TIAs, no chest pain on exertion, no dyspnea on exertion, no swelling of ankles. She denies adverse effects from his BP medications. Compliance with medication is good.  Obesity: She continues on Trulicity, now at 3.0 mg weekly.  Fortunately her weight loss has been doing well.  She has lost about 15 pounds in the last 3 months.  No adverse side effects.  Compliant with medications.  Eating healthier diet.  Feels good about this.  Has history of prediabetes.  No symptoms of hypoglycemia Reviewed orthopedics office visits x2.  She reports that she has intermittent bilateral knee swelling.  About a month ago, large swollen left knee with limitations of range of motion due to the swelling.  No warmth or significant pain.  No injury.  Orthopedic assessment was done.  Aspiration and steroid injection given.  She reports he removed 100 cc of fluid.  Fluid analysis was negative for infection or crystals.  She denies other significant joint problems although once in a while her ankle will hurt.  No red hot swollen joints.  No family history of inflammatory arthropathies.  Assessment  1. Morbid obesity with BMI of 40.0-44.9, adult ([place])   2. Idiopathic thrombocytopenic purpura (ITP) ([facility])   3. Effusion, left knee   4. Chronic pain of left knee   5. Essential hypertension      Plan   Hypertension f/u: BP control is well controlled.  Continue current medicines.  Low-salt diet ITP currently stable clinically, following with hematology Large left knee effusion: Needs further work-up for inflammatory  arthropathies.  Lab work ordered.  Education given. Flu shot today  Education regarding management of these chronic disease states was given. Management strategies discussed on successive visits include dietary and exercise recommendations, goals of achieving and maintaining IBW, and lifestyle modifications aiming for adequate sleep and minimizing stressors.   Follow up: 3 months for complete physical and follow-up  Orders Placed This Encounter  Procedures   ANA   Sedimentation rate   Lupus Anticoagulant and Antiphospholipid Confirmation w/Consultation   Anti-DNA antibody, double-stranded   Rheumatoid Factor   Cyclic Citrul Peptide Antibody, IGG   No orders of the defined types were placed in this encounter.     BP Readings from Last 3 Encounters:  11/09/22 120/82  08/09/22 (!) 122/94  05/14/22 110/74   Wt Readings from Last 3 Encounters:  11/09/22 281 lb 3.2 oz (127.6 kg)  08/09/22 295 lb 12.8 oz (134.2 kg)  05/14/22 283 lb (128.4 kg)    Lab Results  Component Value Date   CHOL 190 02/23/2020   CHOL 185 03/06/2019   Lab Results  Component Value Date   HDL 39.50 02/23/2020   HDL 42 (L) 03/06/2019   Lab Results  Component Value Date   LDLCALC 137 (H) 02/23/2020   LDLCALC 125 (H) 03/06/2019   Lab Results  Component Value Date   TRIG 69.0 02/23/2020   TRIG 81 03/06/2019   Lab Results  Component Value Date   CHOLHDL 5 02/23/2020   CHOLHDL 4.4  03/06/2019   No results found for: "LDLDIRECT" Lab Results  Component Value Date   CREATININE 0.77 08/09/2022   BUN 10 08/09/2022   NA 139 08/09/2022   K 3.9 08/09/2022   CL 104 08/09/2022   CO2 28 08/09/2022    The 10-year ASCVD risk score (Arnett DK, et al., 2019) is: 2.2%   Values used to calculate the score:     Age: 31 years     Sex: Female     Is Non-Hispanic African American: Yes     Diabetic: No     Tobacco smoker: No     Systolic Blood Pressure: 710 mmHg     Is BP treated: Yes     HDL Cholesterol:  39.5 mg/dL     Total Cholesterol: 190 mg/dL  I reviewed the patients updated PMH, FH, and SocHx.    Patient Active Problem List   Diagnosis Date Noted   Idiopathic thrombocytopenic purpura (ITP) ([place]) 05/09/2020    Priority: High   Prediabetes 02/25/2020    Priority: High   Migraine with aura and without status migrainosus, not intractable 02/23/2020    Priority: High   Morbid obesity with BMI of 40.0-44.9, adult ([place]) 12/03/2017    Priority: High   Essential hypertension 12/20/2016    Priority: High   Idiopathic thrombocytopenic purpura ([place]) 02/20/2012    Priority: High   Pure hypercholesterolemia 03/08/2019    Priority: Medium    IUD (intrauterine device) in place 08/01/2013    Priority: Medium    Seasonal allergies 11/08/2020    Priority: Low   Fibroid 05/13/2012    Priority: Low    Allergies: Nsaids  Social History: Patient  reports that she has never smoked. She has never used smokeless tobacco. She reports that she does not drink alcohol and does not use drugs.  Current Meds  Medication Sig   acetaminophen (TYLENOL) 500 MG tablet Take 500 mg by mouth every 6 (six) hours as needed for headache.   diltiazem (CARDIZEM CD) 240 MG 24 hr capsule TAKE 1 CAPSULE BY MOUTH EVERY DAY   Dulaglutide (TRULICITY) 3 GY/6.9SW SOPN Inject 3 mg as directed once a week.   fluticasone (FLONASE) 50 MCG/ACT nasal spray Place 1 spray into both nostrils daily.   hydrochlorothiazide (HYDRODIURIL) 25 MG tablet Take 1 tablet (25 mg total) by mouth daily.   levonorgestrel (MIRENA) 20 MCG/24HR IUD 1 each by Intrauterine route once.   [DISCONTINUED] Dulaglutide (TRULICITY) 1.5 NI/6.2VO SOPN Inject 1.5 mg into the skin once a week.    Review of Systems: Cardiovascular: negative for chest pain, palpitations, leg swelling, orthopnea Respiratory: negative for SOB, wheezing or persistent cough Gastrointestinal: negative for abdominal pain Genitourinary: negative for dysuria or gross  hematuria  Objective  Vitals: BP 120/82   Pulse 78   Temp (!) 97.4 F (36.3 C) (Temporal)   Ht '5\' 10"'$  (1.778 m)   Wt 281 lb 3.2 oz (127.6 kg)   SpO2 98%   BMI 40.35 kg/m  General: no acute distress  Psych:  Alert and oriented, normal mood and affect HEENT:  Normocephalic, atraumatic, supple neck  Cardiovascular:  RRR without murmur. no edema Respiratory:  Good breath sounds bilaterally, CTAB with normal respiratory effort Neurologic:   Mental status is normal Commons side effects, risks, benefits, and alternatives for medications and treatment plan prescribed today were discussed, and the patient expressed understanding of the given instructions. Patient is instructed to call or message via MyChart if he/she has any questions or  concerns regarding our treatment plan. No barriers to understanding were identified. We discussed Red Flag symptoms and signs in detail. Patient expressed understanding regarding what to do in case of urgent or emergency type symptoms.  Medication list was reconciled, printed and provided to the patient in AVS. Patient instructions and summary information was reviewed with the patient as documented in the AVS. This note was prepared with assistance of Dragon voice recognition software. Occasional wrong-word or sound-a-like substitutions may have occurred due to the inherent limitation

## 2022-11-09 NOTE — Patient Instructions (Signed)
Please return in 3 months for your annual complete physical; please come fasting.   I will release your lab results to you on your MyChart account with further instructions. You may see the results before I do, but when I review them I will send you a message with my report or have my assistant call you if things need to be discussed. Please reply to my message with any questions. Thank you!   If you have any questions or concerns, please don't hesitate to send me a message via MyChart or call the office at [phone]. Thank you for visiting with [place] today! It's our pleasure caring for you.   Knee Effusion Knee effusion refers to excess fluid in the knee joint. This can cause pain and swelling in your knee. Knee effusion creates more pressure than usual in your knee joint. This makes it more difficult for you to bend and move your knee. If there is fluid in your knee, it often means that something is wrong inside your knee. This can be a result of: Severe arthritis. Injury to the knee muscles or to tissues in the knee (ligaments or cartilage). Infection. Autoimmune disease. This means that your body's defense system (immune system) mistakenly attacks healthy body tissues. Follow these instructions at home: If you have a brace or an immobilizer: Wear it as told by your health care provider. Remove it only as told by your health care provider. Check the skin around it every day. Tell your health care provider about any concerns. Loosen it if your toes tingle, become numb, or turn cold and blue. Keep it clean. If the brace or immobilizer is not waterproof: Do not let it get wet. Cover it with a watertight covering when you take a bath or shower. Managing pain, stiffness, and swelling  If directed, put ice on the affected area. To do this: If you have a removable brace or immobilizer, remove it as told by your health care provider. Put ice in a plastic bag. Place a towel between your skin and  the bag. Leave the ice on for 20 minutes, 2-3 times a day. Remove the ice if your skin turns bright red. This is very important. If you cannot feel pain, heat, or cold, you have a greater risk of damage to the area. Move your toes often to reduce stiffness and swelling. Raise (elevate) your knee above the level of your heart while you are sitting or lying down. Activity Rest as told by your health care provider. Do not use the injured limb to support your body weight until your health care provider says that you can. Use crutches as told by your health care provider. Do exercises as told by your health care provider. General instructions Take over-the-counter and prescription medicines only as told by your health care provider. Do not use any products that contain nicotine or tobacco. These products include cigarettes, chewing tobacco, and vaping devices, such as e-cigarettes. If you need help quitting, ask your health care provider. Wear an elastic bandage or a wrap that puts pressure on your knee (compression wrap) as told by your health care provider. Keep all follow-up visits. This is important. Contact a health care provider if: You continue to have pain in your knee. You have a fever or chills. Get help right away if: You have swelling or redness in your knee that gets worse or does not get better. You have severe pain in your knee. Summary Knee effusion refers to excess  fluid in the knee joint. This causes pain and swelling and makes it difficult to bend and move your knee. Effusion may be caused by severe arthritis, autoimmune disease, infection, or injury to the knee muscles or to tissues in the knee (ligaments or cartilage). Take over-the-counter and prescription medicines only as told by your health care provider. If you have a brace or an immobilizer, wear it as told by your health care provider. This information is not intended to replace advice given to you by your health care  provider. Make sure you discuss any questions you have with your health care provider. Document Revised: 08/17/2020 Document Reviewed: 08/17/2020 Elsevier Patient Education  [employer].

## 2022-11-23 LAB — ANTI-DNA ANTIBODY, DOUBLE-STRANDED: ds DNA Ab: <1 IU/mL

## 2022-11-23 LAB — ANA: Anti Nuclear Antibody (ANA): NEGATIVE

## 2022-11-23 LAB — LUPUS ANTICOAGULANT AND ANTIPHOSPHOLIPID CONFIRM W/CONSULT
Anticardiolipin IgG: <2 GPL-U/mL (ref <20.0)
Anticardiolipin IgM: <2 MPL-U/mL (ref <20.0)
Beta-2 Glyco 1 IgM: <2 U/mL (ref <20.0)
Beta-2 Glyco I IgG: <2 U/mL (ref <20.0)
PTT-LA Screen: 33 s (ref <=40)
dRVVT: 38 s (ref <=45)

## 2022-11-23 LAB — CYCLIC CITRUL PEPTIDE ANTIBODY, IGG: Cyclic Citrullin Peptide Ab: <16 UNITS

## 2022-11-23 LAB — RHEUMATOID FACTOR: Rheumatoid fact SerPl-aCnc: <14 IU/mL (ref <14)

## 2023-01-28 ENCOUNTER — Other Ambulatory Visit: Payer: Self-pay | Admitting: Family Medicine

## 2023-02-21 ENCOUNTER — Ambulatory Visit (INDEPENDENT_AMBULATORY_CARE_PROVIDER_SITE_OTHER): Payer: BC Managed Care – PPO | Admitting: Family Medicine

## 2023-02-21 ENCOUNTER — Encounter: Payer: Self-pay | Admitting: Family Medicine

## 2023-02-21 VITALS — BP 120/78 | HR 79 | Temp 98.3°F | Ht 70.0 in | Wt 279.0 lb

## 2023-02-21 DIAGNOSIS — R7303 Prediabetes: Secondary | ICD-10-CM

## 2023-02-21 DIAGNOSIS — Z0000 Encounter for general adult medical examination without abnormal findings: Secondary | ICD-10-CM

## 2023-02-21 DIAGNOSIS — I10 Essential (primary) hypertension: Secondary | ICD-10-CM

## 2023-02-21 DIAGNOSIS — Z6841 Body Mass Index (BMI) 40.0 and over, adult: Secondary | ICD-10-CM | POA: Diagnosis not present

## 2023-02-21 DIAGNOSIS — G43109 Migraine with aura, not intractable, without status migrainosus: Secondary | ICD-10-CM

## 2023-02-21 DIAGNOSIS — D693 Immune thrombocytopenic purpura: Secondary | ICD-10-CM

## 2023-02-21 LAB — CBC WITH DIFFERENTIAL/PLATELET
Basophils Absolute: 0 10*3/uL (ref 0.0–0.1)
Basophils Relative: 0.3 % (ref 0.0–3.0)
Eosinophils Absolute: 0.2 10*3/uL (ref 0.0–0.7)
Eosinophils Relative: 3.3 % (ref 0.0–5.0)
HCT: 40.9 % (ref 36.0–46.0)
Hemoglobin: 13.7 g/dL (ref 12.0–15.0)
Lymphocytes Relative: 16.1 % (ref 12.0–46.0)
Lymphs Abs: 1.1 10*3/uL (ref 0.7–4.0)
MCHC: 33.5 g/dL (ref 30.0–36.0)
MCV: 85 fl (ref 78.0–100.0)
Monocytes Absolute: 0.5 10*3/uL (ref 0.1–1.0)
Monocytes Relative: 7.1 % (ref 3.0–12.0)
Neutro Abs: 5 10*3/uL (ref 1.4–7.7)
Neutrophils Relative %: 73.2 % (ref 43.0–77.0)
Platelets: 249 10*3/uL (ref 150.0–400.0)
RBC: 4.81 Mil/uL (ref 3.87–5.11)
RDW: 14.7 % (ref 11.5–15.5)
WBC: 6.9 10*3/uL (ref 4.0–10.5)

## 2023-02-21 LAB — COMPREHENSIVE METABOLIC PANEL
ALT: 12 U/L (ref 0–35)
AST: 11 U/L (ref 0–37)
Albumin: 4.7 g/dL (ref 3.5–5.2)
Alkaline Phosphatase: 72 U/L (ref 39–117)
BUN: 12 mg/dL (ref 6–23)
CO2: 28 mEq/L (ref 19–32)
Calcium: 9.7 mg/dL (ref 8.4–10.5)
Chloride: 101 mEq/L (ref 96–112)
Creatinine, Ser: 0.86 mg/dL (ref 0.40–1.20)
GFR: 84.39 mL/min (ref >60.00)
Glucose, Bld: 79 mg/dL (ref 70–99)
Potassium: 3.7 mEq/L (ref 3.5–5.1)
Sodium: 138 mEq/L (ref 135–145)
Total Bilirubin: 0.9 mg/dL (ref 0.2–1.2)
Total Protein: 6.5 g/dL (ref 6.0–8.3)

## 2023-02-21 LAB — LIPID PANEL
Cholesterol: 202 mg/dL — ABNORMAL HIGH (ref 0–200)
HDL: 48.2 mg/dL (ref >39.00)
LDL Cholesterol: 140 mg/dL — ABNORMAL HIGH (ref 0–99)
NonHDL: 153.62
Total CHOL/HDL Ratio: 4
Triglycerides: 67 mg/dL (ref 0.0–149.0)
VLDL: 13.4 mg/dL (ref 0.0–40.0)

## 2023-02-21 LAB — HEMOGLOBIN A1C: Hgb A1c MFr Bld: 5.5 % (ref 4.6–6.5)

## 2023-02-21 MED ORDER — TRULICITY 4.5 MG/0.5ML ~~LOC~~ SOAJ: 4.5000 mg | SUBCUTANEOUS | 3 refills | Status: AC

## 2023-02-21 MED ORDER — HYDROCHLOROTHIAZIDE 25 MG PO TABS: 25.0000 mg | ORAL_TABLET | Freq: Every day | ORAL | 3 refills | Status: DC

## 2023-02-21 MED ORDER — DILTIAZEM HCL ER COATED BEADS 240 MG PO CP24: ORAL_CAPSULE | ORAL | 3 refills | Status: DC

## 2023-02-21 NOTE — Patient Instructions (Signed)
Please return in 6 months for blood pressure and weight check.   I have increased the dose of trulicity to [id number] weekly. I will release your lab results to you on your MyChart account with further instructions. You may see the results before I do, but when I review them I will send you a message with my report or have my assistant call you if things need to be discussed. Please reply to my message with any questions. Thank you!   If you have any questions or concerns, please don't hesitate to send me a message via MyChart or call the office at [phone]. Thank you for visiting with [place] today! It's our pleasure caring for you.

## 2023-02-21 NOTE — Progress Notes (Signed)
Subjective  Chief Complaint  Patient presents with   Annual Exam    Pt here for Annual exam and is currently fasting     HPI: Bailey Sheppard is a 42 y.o. female who presents to [facility] [place] today for a Female Wellness Visit.  She also has the concerns and/or needs as listed above in the chief complaint. These will be addressed in addition to the Health Maintenance Visit.   Wellness Visit: annual visit with health maintenance review and exam without Pap  Health maintenance: Sees GYN for female wellness.  IUD in place for birth control.  Screens current.  Immunizations up-to-date. Chronic disease management visit and/or acute problem visit: Obesity: Continues on Trulicity 3.0 mg weekly.  Weight continues to trend down.  She had a difficult week last week with Valentine's Day and grief.  She admits she did not eat well and has gained some water weight.  No adverse effects.  Would like to exercise more Blood pressure is well-controlled on current medications.  No chest pain or shortness of breath. ITP is stable.  Reviewed hematology notes. Migraines, rare History of prediabetes.  Assessment  1. Annual physical exam   2. Morbid obesity with BMI of 40.0-44.9, adult ([place])   3. Essential hypertension   4. Idiopathic thrombocytopenic purpura ([place])   5. Migraine with aura and without status migrainosus, not intractable   6. Prediabetes      Plan  Female Wellness Visit: Age appropriate Health Maintenance and Prevention measures were discussed with patient. Included topics are cancer screening recommendations, ways to keep healthy (see AVS) including dietary and exercise recommendations, regular eye and dental care, use of seat belts, and avoidance of moderate alcohol use and tobacco use.  BMI: discussed patient's BMI and encouraged positive lifestyle modifications to help get to or maintain a target BMI. HM needs and immunizations were addressed and ordered. See  below for orders. See HM and immunization section for updates. Routine labs and screening tests ordered including cmp, cbc and lipids where appropriate. Discussed recommendations regarding Vit D and calcium supplementation (see AVS)  Chronic disease f/u and/or acute problem visit: (deemed necessary to be done in addition to the wellness visit): Obesity: Reassured, get back on track with diet changes.  Increase Trulicity dose to 4 mg weekly.  Patient is getting a new job with new insurance soon.  Will consider trying to get zepbound or Wegovy Hypertension is very well-controlled on Cardizem CD 240 mg daily and HCTZ 25 mg daily.  Recheck electrolytes and lipids.  Patient is fasting Monitoring A1c.  Should be normal on Trulicity Migraines are controlled.  Rare rescue medications ITP stable.  Follow along with hematology and check CBC today.  Follow up: 6 months for high blood pressure recheck  Orders Placed This Encounter  Procedures   CBC with Differential/Platelet   Comprehensive metabolic panel   Hemoglobin A1c   Lipid panel   Meds ordered this encounter  Medications   diltiazem (CARDIZEM CD) 240 MG 24 hr capsule    Sig: TAKE 1 CAPSULE BY MOUTH EVERY DAY    Dispense:  90 capsule    Refill:  3    DX Code Needed  .   hydrochlorothiazide (HYDRODIURIL) 25 MG tablet    Sig: Take 1 tablet (25 mg total) by mouth daily.    Dispense:  90 tablet    Refill:  3   Dulaglutide (TRULICITY) 4.5 0000000 SOPN    Sig: Inject 4.5 mg  as directed once a week.    Dispense:  6 mL    Refill:  3       Body mass index is 40.03 kg/m. Wt Readings from Last 3 Encounters:  02/21/23 279 lb (126.6 kg)  11/09/22 281 lb 3.2 oz (127.6 kg)  08/09/22 295 lb 12.8 oz (134.2 kg)   Need for contraception: Yes, IUD  Patient Active Problem List   Diagnosis Date Noted   Prediabetes 02/25/2020    Priority: High    a1c 6.1 01/2020    Migraine with aura and without status migrainosus, not intractable  02/23/2020    Priority: High   Morbid obesity with BMI of 40.0-44.9, adult ([place]) 12/03/2017    Priority: High   Essential hypertension 12/20/2016    Priority: High   Idiopathic thrombocytopenic purpura ([place]) 02/20/2012    Priority: High   Pure hypercholesterolemia 03/08/2019    Priority: Medium    IUD (intrauterine device) in place 08/01/2013    Priority: Medium     Postpartum 2020, Mirena Birth control and menorrhagia related anemia    Seasonal allergies 11/08/2020    Priority: Low   Fibroid 05/13/2012    Priority: [place] Maintenance  Topic Date Due   COVID-19 Vaccine (3 - 2023-24 season) 03/09/2023 (Originally 08/31/2022)   PAP SMEAR-Modifier  01/03/2027   DTaP/Tdap/Td (2 - Td or Tdap) 07/30/2027   INFLUENZA VACCINE  Completed   Hepatitis C Screening  Completed   HIV Screening  Completed   HPV VACCINES  Aged Out   Immunization History  Administered Date(s) Administered   Influenza, Seasonal, Injecte, Preservative Fre 10/31/2014, 11/29/2015   Influenza,inj,Quad PF,6+ Mos 10/31/2014, 11/29/2015, 10/19/2016, 01/10/2018, 09/19/2018, 11/09/2022   PFIZER(Purple Top)SARS-COV-2 Vaccination 12/09/2020, 08/07/2021   Tdap 07/29/2017   We updated and reviewed the patient's past history in detail and it is documented below. Allergies: Patient  reports no history of alcohol use. Past Medical History Patient  has a past medical history of Acute pericarditis, unspecified (06/11/2013), Anemia, Anxiety, Chronic hypertension during pregnancy (09/17/2018), Depression, Elevated hemoglobin A1c (2018), Fibroids, Hypertension, ITP (idiopathic thrombocytopenic purpura), Obesity, and Vaginal Pap smear, abnormal. Past Surgical History Patient  has a past surgical history that includes Umbilical hernia repair and Intrauterine device (iud) insertion (04/2012). Social History   Socioeconomic History   Marital status: Soil scientist    Spouse name: donnell   Number of children: 3   Years of  education: Not on file   Highest education level: Not on file  Occupational History   Occupation: Estate manager/land agent.    Employer: [employer]  Tobacco Use   Smoking status: Never   Smokeless tobacco: Never  Vaping Use   Vaping Use: Never used  Substance and Sexual Activity   Alcohol use: No   Drug use: No   Sexual activity: Yes    Partners: Male    Birth control/protection: I.U.D.  Other Topics Concern   Not on file  Social History Narrative   Has 3 children   Social Determinants of Health   Financial Resource Strain: Low Risk  (09/08/2018)   Overall Financial Resource Strain (CARDIA)    Difficulty of Paying Living Expenses: Not hard at all  Food Insecurity: No Food Insecurity (09/08/2018)   Hunger Vital Sign    Worried About Running Out of Food in the Last Year: Never true    Ran Out of Food in the Last Year: Never true  Transportation Needs: Unknown (09/08/2018)   PRAPARE - Transportation  Lack of Transportation (Medical): No    Lack of Transportation (Non-Medical): Not on file  Physical Activity: Inactive (09/08/2018)   Exercise Vital Sign    Days of Exercise per Week: 0 days    Minutes of Exercise per Session: 0 min  Stress: No Stress Concern Present (09/17/2018)   [place]    Feeling of Stress : Only a little  Social Connections: Unknown (09/17/2018)   Social Connection and Isolation Panel [NHANES]    Frequency of Communication with Friends and Family: Patient refused    Frequency of Social Gatherings with Friends and Family: Patient refused    Attends Religious Services: Patient refused    Marine scientist or Organizations: Patient refused    Attends Music therapist: Patient refused    Marital Status: Patient refused   Family History  Problem Relation Age of Onset   Hypertension Mother    Diabetes Father    Kidney failure Father    Heart failure Father    Diabetes  Brother    Lupus Cousin    Diabetes Maternal Grandfather    Kidney failure Maternal Grandfather    Diabetes Paternal Grandmother     Review of Systems: Constitutional: negative for fever or malaise Ophthalmic: negative for photophobia, double vision or loss of vision Cardiovascular: negative for chest pain, dyspnea on exertion, or new LE swelling Respiratory: negative for SOB or persistent cough Gastrointestinal: negative for abdominal pain, change in bowel habits or melena Genitourinary: negative for dysuria or gross hematuria, no abnormal uterine bleeding or disharge Musculoskeletal: negative for new gait disturbance or muscular weakness Integumentary: negative for new or persistent rashes, no breast lumps Neurological: negative for TIA or stroke symptoms Psychiatric: negative for SI or delusions Allergic/Immunologic: negative for hives  Patient Care Team    Relationship Specialty Notifications Start End  Leamon Arnt, MD PCP - General Family Medicine  02/23/20   Nunzio Cobbs, MD Consulting Physician Obstetrics and Gynecology  07/15/18     Objective  Vitals: BP 120/78   Pulse 79   Temp 98.3 F (36.8 C)   Ht 5' 10"$  (1.778 m)   Wt 279 lb (126.6 kg)   SpO2 98%   BMI 40.03 kg/m  General:  Well developed, well nourished, no acute distress  Psych:  Alert and orientedx3,normal mood and affect HEENT:  Normocephalic, atraumatic, non-icteric sclera, PERRL, supple neck without adenopathy, mass or thyromegaly Cardiovascular:  Normal S1, S2, RRR without gallop, rub or murmur Respiratory:  Good breath sounds bilaterally, CTAB with normal respiratory effort Gastrointestinal: normal bowel sounds, soft, non-tender, no noted masses. No HSM MSK: no deformities, contusions. Joints are without erythema or swelling.  Skin:  Warm, no rashes or suspicious lesions noted Neurologic:    Mental status is normal. Gross motor and sensory exams are normal. Normal gait. No  tremor    Commons side effects, risks, benefits, and alternatives for medications and treatment plan prescribed today were discussed, and the patient expressed understanding of the given instructions. Patient is instructed to call or message via MyChart if he/she has any questions or concerns regarding our treatment plan. No barriers to understanding were identified. We discussed Red Flag symptoms and signs in detail. Patient expressed understanding regarding what to do in case of urgent or emergency type symptoms.  Medication list was reconciled, printed and provided to the patient in AVS. Patient instructions and summary information was reviewed with the patient as  documented in the AVS. This note was prepared with assistance of Dragon voice recognition software. Occasional wrong-word or sound-a-like substitutions may have occurred due to the inherent limitations of voice recognition software .

## 2023-02-22 ENCOUNTER — Encounter: Payer: Self-pay | Admitting: Family Medicine

## 2023-02-25 ENCOUNTER — Ambulatory Visit (INDEPENDENT_AMBULATORY_CARE_PROVIDER_SITE_OTHER): Payer: BC Managed Care – PPO | Admitting: Family Medicine

## 2023-02-25 ENCOUNTER — Encounter: Payer: Self-pay | Admitting: Family Medicine

## 2023-02-25 VITALS — BP 108/80 | HR 91 | Temp 98.3°F | Ht 70.0 in | Wt 275.2 lb

## 2023-02-25 DIAGNOSIS — F430 Acute stress reaction: Secondary | ICD-10-CM

## 2023-02-25 DIAGNOSIS — F4321 Adjustment disorder with depressed mood: Secondary | ICD-10-CM

## 2023-02-25 MED ORDER — ALPRAZOLAM 0.5 MG PO TABS: 0.5000 mg | ORAL_TABLET | Freq: Every day | ORAL | 0 refills | Status: AC | PRN

## 2023-02-25 NOTE — Patient Instructions (Addendum)
Please follow up if symptoms do not improve or as needed.   

## 2023-02-25 NOTE — Progress Notes (Signed)
Subjective  CC:   Chief Complaint  Patient presents with   Anxiety    HPI: Bailey Sheppard is a 42 y.o. female who presents to the office today to address the problems listed above in the chief complaint. See last note; struggling due to grief/stress work: tearful. Trouble sleeping and some panic. Triggered by valentines day. Had been on sertraline in past after loss of fiance. Doesn't feel she needs to restart chronic meds at this time however feels taking a break from work would be helpful to reset.    Assessment  1. Stress reaction   2. Complicated grief      Plan  Stress/grief:  counseling done. Treat anxiety/panic w/ prn xanax. Oow x 2 weeks. Start fmla if needed. Monitor mood sxs; if persist can restart sertraline and/or counseling.   Follow up: prn  Visit date not found  No orders of the defined types were placed in this encounter.  Meds ordered this encounter  Medications   ALPRAZolam (XANAX) 0.5 MG tablet    Sig: Take 1 tablet (0.5 mg total) by mouth daily as needed for anxiety.    Dispense:  30 tablet    Refill:  0      I reviewed the patients updated PMH, FH, and SocHx.    Patient Active Problem List   Diagnosis Date Noted   Prediabetes 02/25/2020    Priority: High   Migraine with aura and without status migrainosus, not intractable 02/23/2020    Priority: High   Morbid obesity with BMI of 40.0-44.9, adult ([place]) 12/03/2017    Priority: High   Essential hypertension 12/20/2016    Priority: High   Idiopathic thrombocytopenic purpura ([place]) 02/20/2012    Priority: High   Pure hypercholesterolemia 03/08/2019    Priority: Medium    IUD (intrauterine device) in place 08/01/2013    Priority: Medium    Seasonal allergies 11/08/2020    Priority: Low   Fibroid 05/13/2012    Priority: Low   Current Meds  Medication Sig   acetaminophen (TYLENOL) 500 MG tablet Take 500 mg by mouth every 6 (six) hours as needed for headache.   ALPRAZolam (XANAX) 0.5 MG  tablet Take 1 tablet (0.5 mg total) by mouth daily as needed for anxiety.   diltiazem (CARDIZEM CD) 240 MG 24 hr capsule TAKE 1 CAPSULE BY MOUTH EVERY DAY   Dulaglutide (TRULICITY) 4.5 0000000 SOPN Inject 4.5 mg as directed once a week.   fluticasone (FLONASE) 50 MCG/ACT nasal spray Place 1 spray into both nostrils daily.   hydrochlorothiazide (HYDRODIURIL) 25 MG tablet Take 1 tablet (25 mg total) by mouth daily.   levonorgestrel (MIRENA) 20 MCG/24HR IUD 1 each by Intrauterine route once.    Allergies: Patient is allergic to nsaids. Family History: Patient family history includes Diabetes in her brother, father, maternal grandfather, and paternal grandmother; Heart failure in her father; Hypertension in her mother; Kidney failure in her father and maternal grandfather; Lupus in her cousin. Social History:  Patient  reports that she has never smoked. She has never used smokeless tobacco. She reports that she does not drink alcohol and does not use drugs.  Review of Systems: Constitutional: Negative for fever malaise or anorexia Cardiovascular: negative for chest pain Respiratory: negative for SOB or persistent cough Gastrointestinal: negative for abdominal pain  Objective  Vitals: BP 108/80   Pulse 91   Temp 98.3 F (36.8 C)   Ht '5\' 10"'$  (1.778 m)   Wt 275 lb 3.2 oz (124.8  kg)   SpO2 97%   BMI 39.49 kg/m  General: no acute distress , A&Ox3 Psych: tearful, good insight, nl affect.   Commons side effects, risks, benefits, and alternatives for medications and treatment plan prescribed today were discussed, and the patient expressed understanding of the given instructions. Patient is instructed to call or message via MyChart if he/she has any questions or concerns regarding our treatment plan. No barriers to understanding were identified. We discussed Red Flag symptoms and signs in detail. Patient expressed understanding regarding what to do in case of urgent or emergency type symptoms.   Medication list was reconciled, printed and provided to the patient in AVS. Patient instructions and summary information was reviewed with the patient as documented in the AVS. This note was prepared with assistance of Dragon voice recognition software. Occasional wrong-word or sound-a-like substitutions may have occurred due to the inherent limitations of voice recognition software

## 2023-03-04 ENCOUNTER — Telehealth: Payer: Self-pay | Admitting: Family Medicine

## 2023-03-04 NOTE — Telephone Encounter (Signed)
.  Type of form received: Disability forms  Additional comments:   Received by: Sunnie Nielsen should be Faxed to: [phone]  Form should be mailed to:    Is patient requesting call for pickup:   Form placed:  In provider's box   Attach charge sheet. Yes  Individual made aware of 3-5 business day turn around (Y/N)?  No

## 2023-03-04 NOTE — Telephone Encounter (Signed)
Form placed in Andy's box

## 2023-03-05 ENCOUNTER — Ambulatory Visit (INDEPENDENT_AMBULATORY_CARE_PROVIDER_SITE_OTHER): Payer: BC Managed Care – PPO | Admitting: Family Medicine

## 2023-03-05 ENCOUNTER — Encounter: Payer: Self-pay | Admitting: Family Medicine

## 2023-03-05 VITALS — BP 102/74 | HR 89 | Temp 98.7°F | Ht 70.0 in | Wt 270.2 lb

## 2023-03-05 DIAGNOSIS — F4323 Adjustment disorder with mixed anxiety and depressed mood: Secondary | ICD-10-CM | POA: Insufficient documentation

## 2023-03-05 MED ORDER — SERTRALINE HCL 25 MG PO TABS: ORAL_TABLET | ORAL | 1 refills | Status: DC

## 2023-03-05 NOTE — Progress Notes (Signed)
Subjective  CC:  Chief Complaint  Patient presents with   Anxiety    Pt stated that she did not like the Xanax and would like to switch to another medication    HPI: Bailey Sheppard is a 42 y.o. female who presents to the office today to address the problems listed above in the chief complaint, mood problems. Unfortunately, patient continues to struggle.  Mood continues to worsen.  Depression screen remains positive.  Anxiety is persisting.  Uses Xanax once to help her sleep and then felt she had some side effects.  However she reports she had a flulike illness with headache, myalgias and malaise.  She since has recovered.  Reports feeling down, tearful, emotional lability, struggles with concentration.  Does not feel like she is able to get back to work due to these mood related problems.  Would be interested in psychotherapy again.  Has been treated for depression in the past.  Was on Lexapro and then sertraline.  Reports that sertraline did well for her.  Would like to restart.  No new symptoms     03/05/2023   10:52 AM 02/25/2023   11:16 AM 02/21/2023    9:46 AM  Depression screen PHQ 2/9  Decreased Interest '3 2 1  '$ Down, Depressed, Hopeless '2 2 1  '$ PHQ - 2 Score '5 4 2  '$ Altered sleeping '2 2 1  '$ Tired, decreased energy '2 1 1  '$ Change in appetite '1 2 2  '$ Feeling bad or failure about yourself  0 0 0  Trouble concentrating 0 3 2  Moving slowly or fidgety/restless 1 1 0  Suicidal thoughts 0 0 0  PHQ-9 Score '11 13 8  '$ Difficult doing work/chores Somewhat difficult Very difficult Somewhat difficult      03/05/2023   10:53 AM 02/25/2023   11:17 AM 02/21/2023    9:47 AM  GAD 7 : Generalized Anxiety Score  Nervous, Anxious, on Edge '3 2 1  '$ Control/stop worrying 1 1 0  Worry too much - different things 1 1 0  Trouble relaxing '1 1 1  '$ Restless 1 0 0  Easily annoyed or irritable '3 2 2  '$ Afraid - awful might happen 0 0 0  Total GAD 7 Score '10 7 4  '$ Anxiety Difficulty Somewhat difficult Somewhat  difficult Not difficult at all     Assessment  1. Adjustment disorder with mixed anxiety and depressed mood      Plan  Grief complicated with adjustment disorder with anxiety and depression: Restart sertraline.  Education counseling given appropriate use of Xanax.  Unlikely that the side effects are related to Xanax but more related to her recent illness.  She is requesting FMLA leave.  Paperwork will be completed. Reviewed concept of mood problems caused by biochemical imbalance of neurotransmitters and rationale for treatment with medications and therapy.  Counseling given: pt was instructed to contact office, on-call physician or crisis Hotline if symptoms worsen significantly. If patient develops any suicidal or homicidal thoughts, she is directed to the ER immediately.   Follow up: Recheck in 6 weeks No orders of the defined types were placed in this encounter.  Meds ordered this encounter  Medications   sertraline (ZOLOFT) 25 MG tablet    Sig: Take 1 tablet (25 mg total) by mouth daily for 14 days, THEN 2 tablets (50 mg total) daily for 14 days.    Dispense:  60 tablet    Refill:  1      I reviewed the  patients updated PMH, FH, and SocHx.    Patient Active Problem List   Diagnosis Date Noted   Prediabetes 02/25/2020    Priority: High   Migraine with aura and without status migrainosus, not intractable 02/23/2020    Priority: High   Morbid obesity with BMI of 40.0-44.9, adult ([place]) 12/03/2017    Priority: High   Essential hypertension 12/20/2016    Priority: High   Idiopathic thrombocytopenic purpura ([place]) 02/20/2012    Priority: High   Pure hypercholesterolemia 03/08/2019    Priority: Medium    IUD (intrauterine device) in place 08/01/2013    Priority: Medium    Seasonal allergies 11/08/2020    Priority: Low   Fibroid 05/13/2012    Priority: Low   Adjustment disorder with anxiety 03/05/2023   Current Meds  Medication Sig   sertraline (ZOLOFT) 25 MG tablet Take  1 tablet (25 mg total) by mouth daily for 14 days, THEN 2 tablets (50 mg total) daily for 14 days.    Allergies: Patient is allergic to nsaids. Family history:  Patient family history includes Diabetes in her brother, father, maternal grandfather, and paternal grandmother; Heart failure in her father; Hypertension in her mother; Kidney failure in her father and maternal grandfather; Lupus in her cousin. Social History   Socioeconomic History   Marital status: Soil scientist    Spouse name: donnell   Number of children: 3   Years of education: Not on file   Highest education level: Not on file  Occupational History   Occupation: Estate manager/land agent.    Employer: [employer]  Tobacco Use   Smoking status: Never   Smokeless tobacco: Never  Vaping Use   Vaping Use: Never used  Substance and Sexual Activity   Alcohol use: No   Drug use: No   Sexual activity: Yes    Partners: Male    Birth control/protection: I.U.D.  Other Topics Concern   Not on file  Social History Narrative   Has 3 children   Social Determinants of Health   Financial Resource Strain: Low Risk  (09/08/2018)   Overall Financial Resource Strain (CARDIA)    Difficulty of Paying Living Expenses: Not hard at all  Food Insecurity: No Food Insecurity (09/08/2018)   Hunger Vital Sign    Worried About Running Out of Food in the Last Year: Never true    Ran Out of Food in the Last Year: Never true  Transportation Needs: Unknown (09/08/2018)   PRAPARE - Hydrologist (Medical): No    Lack of Transportation (Non-Medical): Not on file  Physical Activity: Inactive (09/08/2018)   Exercise Vital Sign    Days of Exercise per Week: 0 days    Minutes of Exercise per Session: 0 min  Stress: No Stress Concern Present (09/17/2018)   [place]    Feeling of Stress : Only a little  Social Connections: Unknown (09/17/2018)   Social  Connection and Isolation Panel [NHANES]    Frequency of Communication with Friends and Family: Patient refused    Frequency of Social Gatherings with Friends and Family: Patient refused    Attends Religious Services: Patient refused    Marine scientist or Organizations: Patient refused    Attends Archivist Meetings: Patient refused    Marital Status: Patient refused     Review of Systems: Constitutional: Negative for fever malaise or anorexia Cardiovascular: negative for chest pain Respiratory: negative for SOB  or persistent cough Gastrointestinal: negative for abdominal pain  Objective  Vitals: BP 102/74   Pulse 89   Temp 98.7 F (37.1 C)   Ht '5\' 10"'$  (1.778 m)   Wt 270 lb 3.2 oz (122.6 kg)   SpO2 96%   BMI 38.77 kg/m  General: no acute distress, well appearing, no apparent distress, well groomed Psych:  Alert and oriented x 3, flat affect, mildly tearful, good insight .     Commons side effects, risks, benefits, and alternatives for medications and treatment plan prescribed today were discussed, and the patient expressed understanding of the given instructions. Patient is instructed to call or message via MyChart if he/she has any questions or concerns regarding our treatment plan. No barriers to understanding were identified. We discussed Red Flag symptoms and signs in detail. Patient expressed understanding regarding what to do in case of urgent or emergency type symptoms.  Medication list was reconciled, printed and provided to the patient in AVS. Patient instructions and summary information was reviewed with the patient as documented in the AVS. This note was prepared with assistance of Dragon voice recognition software. Occasional wrong-word or sound-a-like substitutions may have occurred due to the inherent limitations of voice recognition software

## 2023-03-05 NOTE — Patient Instructions (Signed)
Please return in 6 weeks for recheck.   If you have any questions or concerns, please don't hesitate to send me a message via MyChart or call the office at [phone]. Thank you for visiting with us today! It's our pleasure caring for you.  

## 2023-03-12 ENCOUNTER — Telehealth: Payer: Self-pay

## 2023-03-12 NOTE — Telephone Encounter (Signed)
Spoke with pt to let her know that per Jonni Sanger she will fill out FMLA forms but pt would have to F/U with her psychiatrist to have disability forms completed. Pt understood.  Leamon Arnt

## 2023-03-12 NOTE — Telephone Encounter (Signed)
Spoke with Bailey Sheppard from [employer] [place] and she stated to me that she need OV notes from the 02/25/2023 and after. I have faxed that information over to her and also I let her know that per Jonni Sanger that she will not be able to fill out the disability for that pt would have to see her psychiatrics for those forms to be completed. Wand said that she would make a note of this.  Bailey Sheppard

## 2023-03-18 DIAGNOSIS — Z0279 Encounter for issue of other medical certificate: Secondary | ICD-10-CM

## 2023-03-28 ENCOUNTER — Other Ambulatory Visit: Payer: Self-pay | Admitting: Family Medicine

## 2023-04-04 ENCOUNTER — Ambulatory Visit: Payer: BC Managed Care – PPO | Admitting: Family Medicine

## 2023-04-16 ENCOUNTER — Ambulatory Visit (INDEPENDENT_AMBULATORY_CARE_PROVIDER_SITE_OTHER): Payer: BC Managed Care – PPO | Admitting: Family Medicine

## 2023-04-16 ENCOUNTER — Encounter: Payer: Self-pay | Admitting: Family Medicine

## 2023-04-16 VITALS — BP 124/78 | HR 86 | Temp 98.4°F | Ht 70.0 in | Wt 272.2 lb

## 2023-04-16 DIAGNOSIS — F4321 Adjustment disorder with depressed mood: Secondary | ICD-10-CM

## 2023-04-16 DIAGNOSIS — F4323 Adjustment disorder with mixed anxiety and depressed mood: Secondary | ICD-10-CM

## 2023-04-16 DIAGNOSIS — Z6841 Body Mass Index (BMI) 40.0 and over, adult: Secondary | ICD-10-CM

## 2023-04-16 MED ORDER — SERTRALINE HCL 50 MG PO TABS: 50.0000 mg | ORAL_TABLET | Freq: Every day | ORAL | 3 refills | Status: AC

## 2023-04-16 NOTE — Progress Notes (Signed)
Subjective  CC:  Chief Complaint  Patient presents with   Depression   Anxiety    HPI: Bailey Sheppard is a 42 y.o. female who presents to the office today to address the problems listed above in the chief complaint, mood problems. Follow-up depression with anxiety/grief, started sertraline about 6 weeks ago.  Fortunately has had a very good response.  Coping better, less sadness and less anxiety.  She is back to work.  Sometimes has problems focusing and feeling overwhelmed but is a very busy working single mother.  Side effect: Intermittent fleeting dizziness, rare occasions. Obesity: Doing well on Trulicity.  Will increase her dose of Trulicity next month.  Weight loss continues to trend downward.  She feels good about this.     04/16/2023   10:00 AM 03/05/2023   10:52 AM 02/25/2023   11:16 AM  Depression screen PHQ 2/9  Decreased Interest Down, Depressed, Hopeless 0 2 2  PHQ - 2 Score Altered sleeping 0 2 2  Tired, decreased energy Change in appetite Feeling bad or failure about yourself  0 0 0  Trouble concentrating 1 0 3  Moving slowly or fidgety/restless 0 1 1  Suicidal thoughts 0 0 0  PHQ-9 Score Difficult doing work/chores Not difficult at all Somewhat difficult Very difficult      04/16/2023   10:01 AM 03/05/2023   10:53 AM 02/25/2023   11:17 AM 02/21/2023    9:47 AM  GAD 7 : Generalized Anxiety Score  Nervous, Anxious, on Edge 0 Control/stop worrying 0 1 1 0  Worry too much - different things 0 1 1 0  Trouble relaxing 0 Restless 0 1 0 0  Easily annoyed or irritable 0 Afraid - awful might happen 0 0 0 0  Total GAD 7 Score 0 Anxiety Difficulty Not difficult at all Somewhat difficult Somewhat difficult Not difficult at all    Assessment  1. Adjustment disorder with mixed anxiety and depressed mood   2. Complicated grief   3. Morbid obesity with BMI of 40.0-44.9, adult      Plan   Depression/anxiety: Much improved on sertraline.  Will increase dose to 50 mg daily.  Monitor for worsening side effects. Obesity: Continues to do well on Trulicity.   Follow up: 6 months to recheck mood and weight and blood pressure No orders of the defined types were placed in this encounter.  Meds ordered this encounter  Medications   sertraline (ZOLOFT) 50 MG tablet    Sig: Take 1 tablet (50 mg total) by mouth daily.    Dispense:  90 tablet    Refill:  3      I reviewed the patients updated PMH, FH, and SocHx.    Patient Active Problem List   Diagnosis Date Noted   Prediabetes 02/25/2020    Priority: High   Migraine with aura and without status migrainosus, not intractable 02/23/2020    Priority: High   Morbid obesity with BMI of 40.0-44.9, adult 12/03/2017    Priority: High   Essential hypertension 12/20/2016    Priority: High   Idiopathic thrombocytopenic purpura 02/20/2012    Priority: High   Adjustment disorder with mixed anxiety and depressed mood 03/05/2023    Priority: Medium    Pure hypercholesterolemia 03/08/2019  Priority: Medium    IUD (intrauterine device) in place 08/01/2013    Priority: Medium    Seasonal allergies 11/08/2020    Priority: Low   Fibroid 05/13/2012    Priority: Low   No outpatient medications have been marked as taking for the 04/16/23 encounter (Office Visit) with Willow Ora, MD.    Allergies: Patient is allergic to nsaids. Family history:  Patient family history includes Diabetes in her brother, father, maternal grandfather, and paternal grandmother; Heart failure in her father; Hypertension in her mother; Kidney failure in her father and maternal grandfather; Lupus in her cousin. Social History   Socioeconomic History   Marital status: Media planner    Spouse name: donnell   Number of children: 3   Years of education: Not on file   Highest education level: Not on file  Occupational History   Occupation: Research officer, trade union.    Employer: [employer]  Tobacco Use   Smoking status: Never   Smokeless tobacco: Never  Vaping Use   Vaping Use: Never used  Substance and Sexual Activity   Alcohol use: No   Drug use: No   Sexual activity: Yes    Partners: Male    Birth control/protection: I.U.D.  Other Topics Concern   Not on file  Social History Narrative   Has 3 children   Social Determinants of Health   Financial Resource Strain: Low Risk  (09/08/2018)   Overall Financial Resource Strain (CARDIA)    Difficulty of Paying Living Expenses: Not hard at all  Food Insecurity: No Food Insecurity (09/08/2018)   Hunger Vital Sign    Worried About Running Out of Food in the Last Year: Never true    Ran Out of Food in the Last Year: Never true  Transportation Needs: Unknown (09/08/2018)   PRAPARE - Administrator, Civil Service (Medical): No    Lack of Transportation (Non-Medical): Not on file  Physical Activity: Inactive (09/08/2018)   Exercise Vital Sign    Days of Exercise per Week: 0 days    Minutes of Exercise per Session: 0 min  Stress: No Stress Concern Present (09/17/2018)   [employer] of Occupational Health - Occupational Stress Questionnaire    Feeling of Stress : Only a little  Social Connections: Unknown (09/17/2018)   Social Connection and Isolation Panel [NHANES]    Frequency of Communication with Friends and Family: Patient declined    Frequency of Social Gatherings with Friends and Family: Patient declined    Attends Religious Services: Patient declined    Database administrator or Organizations: Patient declined    Attends Engineer, structural: Patient declined    Marital Status: Patient declined     Review of Systems: Constitutional: Negative for fever malaise or anorexia Cardiovascular: negative for chest pain Respiratory: negative for SOB or persistent cough Gastrointestinal: negative for abdominal pain  Objective  Vitals: BP 124/78   Pulse 86    Temp 98.4 F (36.9 C)   Ht  (1.778 m)   Wt 272 lb 3.2 oz (123.5 kg)   SpO2 97%   BMI 39.06 kg/m  General: no acute distress, well appearing, no apparent distress, well groomed Psych:  Alert and oriented x 3,normal mood, behavior, speech, dress, and thought processes.     Commons side effects, risks, benefits, and alternatives for medications and treatment plan prescribed today were discussed, and the patient expressed understanding of the given instructions. Patient is instructed to call or message  via MyChart if he/she has any questions or concerns regarding our treatment plan. No barriers to understanding were identified. We discussed Red Flag symptoms and signs in detail. Patient expressed understanding regarding what to do in case of urgent or emergency type symptoms.  Medication list was reconciled, printed and provided to the patient in AVS. Patient instructions and summary information was reviewed with the patient as documented in the AVS. This note was prepared with assistance of Dragon voice recognition software. Occasional wrong-word or sound-a-like substitutions may have occurred due to the inherent limitations of voice recognition software

## 2023-04-16 NOTE — Patient Instructions (Signed)
Please return in 6 months recheck mood and weight and blood pressure  If you have any questions or concerns, please don't hesitate to send me a message via MyChart or call the office at [phone]. Thank you for visiting with [place] today! It's our pleasure caring for you.

## 2023-04-22 ENCOUNTER — Encounter: Payer: Self-pay | Admitting: Family Medicine

## 2023-04-24 ENCOUNTER — Ambulatory Visit (INDEPENDENT_AMBULATORY_CARE_PROVIDER_SITE_OTHER): Payer: BC Managed Care – PPO | Admitting: Family Medicine

## 2023-04-24 ENCOUNTER — Encounter: Payer: Self-pay | Admitting: Family Medicine

## 2023-04-24 VITALS — BP 98/82 | HR 82 | Temp 97.9°F | Ht 70.0 in | Wt 273.0 lb

## 2023-04-24 DIAGNOSIS — H8112 Benign paroxysmal vertigo, left ear: Secondary | ICD-10-CM | POA: Diagnosis not present

## 2023-04-24 DIAGNOSIS — I10 Essential (primary) hypertension: Secondary | ICD-10-CM | POA: Diagnosis not present

## 2023-04-24 MED ORDER — HYDROCHLOROTHIAZIDE 12.5 MG PO CAPS: 12.5000 mg | ORAL_CAPSULE | Freq: Every day | ORAL | 3 refills | Status: AC

## 2023-04-24 MED ORDER — MECLIZINE HCL 25 MG PO TABS: 25.0000 mg | ORAL_TABLET | Freq: Three times a day (TID) | ORAL | 0 refills | Status: AC | PRN

## 2023-04-24 NOTE — Progress Notes (Signed)
Subjective  CC:  Chief Complaint  Patient presents with   Dizziness    Pt stated that she has been having dizzy spells for the past 2 weeks and has been constantly dizzy for the past week    HPI: Bailey Sheppard is a 42 y.o. female who presents to the office today to address the problems listed above in the chief complaint. Patient returns due to worsening dizziness.  At last visit had occasional dizziness that she mentioned but since leaving, symptoms are now daily.  Horse approximately 2 days ago.  Worse with head movement.  Worse with looking up.  Has to be cautious going up and down stairs due to dizziness.  Feels unsteady.  No true neurologic deficits including diplopia, dysarthria, hemiparesis or headaches.  She does not feel lightheaded.  Symptoms started prior to changing her dose of Trulicity so she does not think that it is the cause.  No sinus pain no recent infections.  Assessment  1. BPPV (benign paroxysmal positional vertigo), left   2. Essential hypertension      Plan  BPPV: Education given.  Fall prevention discussed.  Avoid high risk activities.  Meclizine as needed.  Discussed habituation.  No red flag symptoms.  Discussed red flag symptoms and emergent care if needed. Hypertension: Blood pressure was low upon arrival, improved with orthostatic check which was negative.  However, will decrease hydrochlorothiazide to 12.5 mg daily to avoid hypotension.  Blood pressures could be lower due to weight loss.  Continue Trulicity  Follow up: As scheduled 08/26/2023  No orders of the defined types were placed in this encounter.  Meds ordered this encounter  Medications   hydrochlorothiazide (MICROZIDE) 12.5 MG capsule    Sig: Take 1 capsule (12.5 mg total) by mouth daily.    Dispense:  90 capsule    Refill:  3   meclizine (ANTIVERT) 25 MG tablet    Sig: Take 1 tablet (25 mg total) by mouth 3 (three) times daily as needed for dizziness.    Dispense:  30 tablet    Refill:   0      I reviewed the patients updated PMH, FH, and SocHx.    Patient Active Problem List   Diagnosis Date Noted   Prediabetes 02/25/2020    Priority: High   Migraine with aura and without status migrainosus, not intractable 02/23/2020    Priority: High   Morbid obesity with BMI of 40.0-44.9, adult 12/03/2017    Priority: High   Essential hypertension 12/20/2016    Priority: High   Idiopathic thrombocytopenic purpura 02/20/2012    Priority: High   Adjustment disorder with mixed anxiety and depressed mood 03/05/2023    Priority: Medium    Pure hypercholesterolemia 03/08/2019    Priority: Medium    IUD (intrauterine device) in place 08/01/2013    Priority: Medium    Seasonal allergies 11/08/2020    Priority: Low   Fibroid 05/13/2012    Priority: Low   Current Meds  Medication Sig   acetaminophen (TYLENOL) 500 MG tablet Take 500 mg by mouth every 6 (six) hours as needed for headache.   ALPRAZolam (XANAX) 0.5 MG tablet Take 1 tablet (0.5 mg total) by mouth daily as needed for anxiety.   diltiazem (CARDIZEM CD) 240 MG 24 hr capsule TAKE 1 CAPSULE BY MOUTH EVERY DAY   Dulaglutide (TRULICITY) 4.5 MG/0.5ML SOPN Inject 4.5 mg as directed once a week.   fluticasone (FLONASE) 50 MCG/ACT nasal spray Place 1 spray into  both nostrils daily.   hydrochlorothiazide (MICROZIDE) 12.5 MG capsule Take 1 capsule (12.5 mg total) by mouth daily.   levonorgestrel (MIRENA) 20 MCG/24HR IUD 1 each by Intrauterine route once.   meclizine (ANTIVERT) 25 MG tablet Take 1 tablet (25 mg total) by mouth 3 (three) times daily as needed for dizziness.   sertraline (ZOLOFT) 50 MG tablet Take 1 tablet (50 mg total) by mouth daily.   [DISCONTINUED] hydrochlorothiazide (HYDRODIURIL) 25 MG tablet Take 1 tablet (25 mg total) by mouth daily.    Allergies: Patient is allergic to nsaids. Family History: Patient family history includes Diabetes in her brother, father, maternal grandfather, and paternal grandmother;  Heart failure in her father; Hypertension in her mother; Kidney failure in her father and maternal grandfather; Lupus in her cousin. Social History:  Patient  reports that she has never smoked. She has never used smokeless tobacco. She reports that she does not drink alcohol and does not use drugs.  Review of Systems: Constitutional: Negative for fever malaise or anorexia Cardiovascular: negative for chest pain Respiratory: negative for SOB or persistent cough Gastrointestinal: negative for abdominal pain  Objective  Vitals: BP 98/82   Pulse 82   Temp 97.9 F (36.6 C)   Ht  (1.778 m)   Wt 273 lb (123.8 kg)   SpO2 99%   BMI 39.17 kg/m  General: no acute distress , A&Ox3 HEENT: PEERL A, EOMI, conjunctiva normal, neck is supple Cardiovascular:  RRR without murmur or gallop.  Respiratory:  Good breath sounds bilaterally, CTAB with normal respiratory effort Skin:  Warm, no rashes Neuro: Normal cranial nerves II through XII, negative cerebellar testing, normal gait.  Negative Romberg. Positive Hallpike maneuver without nystagmus  Commons side effects, risks, benefits, and alternatives for medications and treatment plan prescribed today were discussed, and the patient expressed understanding of the given instructions. Patient is instructed to call or message via MyChart if he/she has any questions or concerns regarding our treatment plan. No barriers to understanding were identified. We discussed Red Flag symptoms and signs in detail. Patient expressed understanding regarding what to do in case of urgent or emergency type symptoms.  Medication list was reconciled, printed and provided to the patient in AVS. Patient instructions and summary information was reviewed with the patient as documented in the AVS. This note was prepared with assistance of Dragon voice recognition software. Occasional wrong-word or sound-a-like substitutions may have occurred due to the inherent limitations of  voice recognition software

## 2023-04-24 NOTE — Patient Instructions (Signed)
Please follow up as scheduled for your next visit with me: 08/26/2023   If you have any questions or concerns, please don't hesitate to send me a message via MyChart or call the office at [phone]. Thank you for visiting with [place] today! It's our pleasure caring for you.   Benign Positional Vertigo Vertigo is the feeling that you or your surroundings are moving when they are not. Benign positional vertigo is the most common form of vertigo. This is usually a harmless condition (benign). This condition is positional. This means that symptoms are triggered by certain movements and positions. This condition can be dangerous if it occurs while you are doing something that could cause harm to yourself or others. This includes activities such as driving or operating machinery. What are the causes? The inner ear has fluid-filled canals that help your brain sense movement and balance. When the fluid moves, the brain receives messages about your body's position. With benign positional vertigo, calcium crystals in the inner ear break free and disturb the inner ear area. This causes your brain to receive confusing messages about your body's position. What increases the risk? You are more likely to develop this condition if: You are a woman. You are 41 years of age or older. You have recently had a head injury. You have an inner ear disease. What are the signs or symptoms? Symptoms of this condition usually happen when you move your head or your eyes in different directions. Symptoms may start suddenly and usually last for less than a minute. They include: Loss of balance and falling. Feeling like you are spinning or moving. Feeling like your surroundings are spinning or moving. Nausea and vomiting. Blurred vision. Dizziness. Involuntary eye movement (nystagmus). Symptoms can be mild and cause only minor problems, or they can be severe and interfere with daily life. Episodes of benign positional  vertigo may return (recur) over time. Symptoms may also improve over time. How is this diagnosed? This condition may be diagnosed based on: Your medical history. A physical exam of the head, neck, and ears. Positional tests to check for or stimulate vertigo. You may be asked to turn your head and change positions, such as going from sitting to lying down. A health care provider will watch for symptoms of vertigo. You may be referred to a health care provider who specializes in ear, nose, and throat problems (ENT or otolaryngologist) or a provider who specializes in disorders of the nervous system (neurologist). How is this treated?  This condition may be treated in a session in which your health care provider moves your head in specific positions to help the displaced crystals in your inner ear move. Treatment for this condition may take several sessions. Surgery may be needed in severe cases, but this is rare. In some cases, benign positional vertigo may resolve on its own in 2-4 weeks. Follow these instructions at home: Safety Move slowly. Avoid sudden body or head movements or certain positions, as told by your health care provider. Avoid driving or operating machinery until your health care provider says it is safe. Avoid doing any tasks that would be dangerous to you or others if vertigo occurs. If you have trouble walking or keeping your balance, try using a cane for stability. If you feel dizzy or unstable, sit down right away. Return to your normal activities as told by your health care provider. Ask your health care provider what activities are safe for you. General instructions Take over-the-counter and prescription  medicines only as told by your health care provider. Drink enough fluid to keep your urine pale yellow. Keep all follow-up visits. This is important. Contact a health care provider if: You have a fever. Your condition gets worse or you develop new symptoms. Your family  or friends notice any behavioral changes. You have nausea or vomiting that gets worse. You have numbness or a prickling and tingling sensation. Get help right away if you: Have difficulty speaking or moving. Are always dizzy or faint. Develop severe headaches. Have weakness in your legs or arms. Have changes in your hearing or vision. Develop a stiff neck. Develop sensitivity to light. These symptoms may represent a serious problem that is an emergency. Do not wait to see if the symptoms will go away. Get medical help right away. Call your local emergency services (911 in the [place] Do not drive yourself to the hospital. Summary Vertigo is the feeling that you or your surroundings are moving when they are not. Benign positional vertigo is the most common form of vertigo. This condition is caused by calcium crystals in the inner ear that become displaced. This causes a disturbance in an area of the inner ear that helps your brain sense movement and balance. Symptoms include loss of balance and falling, feeling that you or your surroundings are moving, nausea and vomiting, and blurred vision. This condition can be diagnosed based on symptoms, a physical exam, and positional tests. Follow safety instructions as told by your health care provider and keep all follow-up visits. This is important. This information is not intended to replace advice given to you by your health care provider. Make sure you discuss any questions you have with your health care provider. Document Revised: 11/16/2020 Document Reviewed: 11/16/2020 Elsevier Patient Education  [place].

## 2023-07-19 ENCOUNTER — Other Ambulatory Visit (HOSPITAL_COMMUNITY): Payer: Self-pay

## 2023-07-19 ENCOUNTER — Telehealth: Payer: Self-pay

## 2023-07-19 NOTE — Telephone Encounter (Signed)
Pharmacy Patient Advocate Encounter   Received notification from CoverMyMeds that prior authorization for Trulicity 4.5MG /0.5ML pen-injectors is required/requested.   Insurance verification completed.   The patient is insured through [facility] .   Per test claim: PA submitted to [facility] via CoverMyMeds Key/confirmation #/EOC [id number] Status is pending

## 2023-07-22 NOTE — Telephone Encounter (Signed)
Pharmacy Patient Advocate Encounter  Received notification from [facility] that Prior Authorization for Trulicity has been DENIED because see below.       Please be advised we currently do not have a Pharmacist to review denials, therefore you will need to process appeals accordingly as needed. Thanks for your support at this time. Contact for appeals are as follows: Phone: [phone], Fax: [phone]

## 2023-07-31 ENCOUNTER — Encounter (INDEPENDENT_AMBULATORY_CARE_PROVIDER_SITE_OTHER): Payer: Self-pay

## 2023-08-26 ENCOUNTER — Ambulatory Visit: Payer: BC Managed Care – PPO | Admitting: Family Medicine

## 2023-08-28 ENCOUNTER — Ambulatory Visit: Payer: BC Managed Care – PPO | Admitting: Family Medicine

## 2023-09-07 ENCOUNTER — Other Ambulatory Visit: Payer: Self-pay | Admitting: Family Medicine

## 2023-10-02 ENCOUNTER — Ambulatory Visit: Payer: BC Managed Care – PPO | Admitting: Dermatology

## 2023-10-16 ENCOUNTER — Ambulatory Visit: Payer: BC Managed Care – PPO | Admitting: Family Medicine

## 2024-01-28 ENCOUNTER — Other Ambulatory Visit: Payer: Self-pay | Admitting: Family Medicine

## 2024-01-28 DIAGNOSIS — I10 Essential (primary) hypertension: Secondary | ICD-10-CM
